# Patient Record
Sex: Female | Born: 1985 | Race: White | Hispanic: No | Marital: Married | Smoking: Current every day smoker
Health system: Southern US, Community
[De-identification: ages and names within clinical notes are randomized; demographics above are authoritative.]

## PROBLEM LIST (undated history)

## (undated) DIAGNOSIS — M1990 Unspecified osteoarthritis, unspecified site: Secondary | ICD-10-CM

## (undated) DIAGNOSIS — R519 Headache, unspecified: Secondary | ICD-10-CM

## (undated) DIAGNOSIS — F32A Depression, unspecified: Secondary | ICD-10-CM

## (undated) DIAGNOSIS — F3130 Bipolar disorder, current episode depressed, mild or moderate severity, unspecified: Secondary | ICD-10-CM

## (undated) DIAGNOSIS — F909 Attention-deficit hyperactivity disorder, unspecified type: Secondary | ICD-10-CM

## (undated) DIAGNOSIS — F419 Anxiety disorder, unspecified: Secondary | ICD-10-CM

## (undated) DIAGNOSIS — R51 Headache: Secondary | ICD-10-CM

## (undated) DIAGNOSIS — F329 Major depressive disorder, single episode, unspecified: Secondary | ICD-10-CM

## (undated) DIAGNOSIS — N189 Chronic kidney disease, unspecified: Secondary | ICD-10-CM

## (undated) DIAGNOSIS — F319 Bipolar disorder, unspecified: Secondary | ICD-10-CM

## (undated) HISTORY — DX: Major depressive disorder, single episode, unspecified: F32.9

## (undated) HISTORY — PX: SHOULDER SURGERY: SHX246

## (undated) HISTORY — PX: KIDNEY SURGERY: SHX687

## (undated) HISTORY — DX: Depression, unspecified: F32.A

## (undated) HISTORY — DX: Bipolar disorder, unspecified: F31.9

## (undated) HISTORY — DX: Attention-deficit hyperactivity disorder, unspecified type: F90.9

## (undated) HISTORY — PX: FOOT SURGERY: SHX648

## (undated) HISTORY — DX: Anxiety disorder, unspecified: F41.9

## (undated) HISTORY — PX: TUBAL LIGATION: SHX77

---

## 2008-10-11 HISTORY — PX: NOVASURE ABLATION: SHX5394

## 2014-01-21 DIAGNOSIS — N200 Calculus of kidney: Secondary | ICD-10-CM | POA: Insufficient documentation

## 2014-01-21 DIAGNOSIS — F4310 Post-traumatic stress disorder, unspecified: Secondary | ICD-10-CM | POA: Diagnosis present

## 2014-01-21 DIAGNOSIS — F609 Personality disorder, unspecified: Secondary | ICD-10-CM | POA: Insufficient documentation

## 2014-01-21 DIAGNOSIS — F1320 Sedative, hypnotic or anxiolytic dependence, uncomplicated: Secondary | ICD-10-CM | POA: Diagnosis present

## 2015-09-19 ENCOUNTER — Ambulatory Visit
Admission: RE | Admit: 2015-09-19 | Discharge: 2015-09-19 | Disposition: A | Discharge status: Home / Self Care | Payer: Medicaid Other | Source: Ambulatory Visit | Attending: Psychiatry | Admitting: Psychiatry

## 2015-09-19 ENCOUNTER — Ambulatory Visit (INDEPENDENT_AMBULATORY_CARE_PROVIDER_SITE_OTHER): Payer: Medicaid Other | Admitting: Psychiatry

## 2015-09-19 ENCOUNTER — Encounter: Payer: Self-pay | Admitting: Psychiatry

## 2015-09-19 ENCOUNTER — Encounter
Admission: RE | Admit: 2015-09-19 | Discharge: 2015-09-19 | Disposition: A | Discharge status: Home / Self Care | Payer: Medicaid Other | Source: Ambulatory Visit | Attending: Psychiatry | Admitting: Psychiatry

## 2015-09-19 VITALS — BP 120/84 | HR 87 | Temp 98.5°F | Ht 64.0 in | Wt 253.2 lb

## 2015-09-19 DIAGNOSIS — Z720 Tobacco use: Secondary | ICD-10-CM | POA: Insufficient documentation

## 2015-09-19 DIAGNOSIS — F332 Major depressive disorder, recurrent severe without psychotic features: Secondary | ICD-10-CM

## 2015-09-19 DIAGNOSIS — F17200 Nicotine dependence, unspecified, uncomplicated: Secondary | ICD-10-CM

## 2015-09-19 DIAGNOSIS — F603 Borderline personality disorder: Secondary | ICD-10-CM

## 2015-09-19 DIAGNOSIS — F3181 Bipolar II disorder: Secondary | ICD-10-CM | POA: Diagnosis not present

## 2015-09-19 HISTORY — DX: Headache, unspecified: R51.9

## 2015-09-19 HISTORY — DX: Chronic kidney disease, unspecified: N18.9

## 2015-09-19 HISTORY — DX: Headache: R51

## 2015-09-19 HISTORY — DX: Unspecified osteoarthritis, unspecified site: M19.90

## 2015-09-19 LAB — BASIC METABOLIC PANEL
Anion gap: 8 (ref 5–15)
BUN: 11 mg/dL (ref 6–20)
CALCIUM: 9.9 mg/dL (ref 8.9–10.3)
CO2: 26 mmol/L (ref 22–32)
Chloride: 103 mmol/L (ref 101–111)
Creatinine, Ser: 0.88 mg/dL (ref 0.44–1.00)
GFR calc Af Amer: >60 mL/min (ref >60)
Glucose, Bld: 101 mg/dL — ABNORMAL HIGH (ref 65–99)
POTASSIUM: 4.1 mmol/L (ref 3.5–5.1)
SODIUM: 137 mmol/L (ref 135–145)

## 2015-09-19 LAB — URINALYSIS COMPLETE WITH MICROSCOPIC (ARMC ONLY)
BILIRUBIN URINE: NEGATIVE
Glucose, UA: NEGATIVE mg/dL
Hgb urine dipstick: NEGATIVE
Ketones, ur: NEGATIVE mg/dL
LEUKOCYTES UA: NEGATIVE
Nitrite: NEGATIVE
PH: 5 (ref 5.0–8.0)
PROTEIN: NEGATIVE mg/dL
Specific Gravity, Urine: 1.015 (ref 1.005–1.030)

## 2015-09-19 LAB — CBC
HCT: 42.5 % (ref 35.0–47.0)
Hemoglobin: 13.9 g/dL (ref 12.0–16.0)
MCH: 29.8 pg (ref 26.0–34.0)
MCHC: 32.7 g/dL (ref 32.0–36.0)
MCV: 91.1 fL (ref 80.0–100.0)
PLATELETS: 197 10*3/uL (ref 150–440)
RBC: 4.66 MIL/uL (ref 3.80–5.20)
RDW: 13 % (ref 11.5–14.5)
WBC: 10.1 10*3/uL (ref 3.6–11.0)

## 2015-09-19 NOTE — Progress Notes (Signed)
[facility] MD Progress Note  09/19/2015 1:53 PM Brittany [name]  MRN:  [id number] Subjective:  Consult for this 29 year old woman with a long-standing history of psychiatric illness for evaluation of possible ECT. [facility]. Notes reviewed. Patient states that she has had mood disorder symptoms at least since age 9 probably before that. Current symptoms consist of constantly depressed mood, feeling of hopelessness, alternating poor sleep and excessive sleepiness, "racing thoughts", frequent anxiety, and frequent suicidal thoughts area patient does not describe hallucinations or delusions. She is currently seeing a physician's assistant and therapist for psychiatric treatment. Current medicines are lithium 600 mg at night, alprazolam 2 mg 3 times a day, Adderall 30 mg per day, trazodone 300 mg per night. Patient is requesting evaluation for ECT to try to address her symptoms of depression area  Social history: Patient lives with her husband and children. Patient does not work outside the home. She has a tumultuous emotional relationship with her family of origin but appears to feel that her husband is supportive.  Medical history: Patient denies any history of heart disease diabetes heart attack or stroke. She has had anesthesia in the past with some nausea as a result. Patient is overweight. No complaints of any breathing disorders.  Substance abuse history: Patient admits that she uses marijuana on a frequent basis. Alcohol use is occasional and not abusive. Denies other drug abuse. Principal Problem: @ Diagnosis:  There are no active problems to display for this patient.  Total Time spent with patient: 1 hour  Past Psychiatric History: Patient states she has had psychiatric treatment since her adolescence. She has had 7 or 8 psychiatric hospitalizations. She has had multiple suicide attempts by overdose primarily. Patient last tried to harm herself about a  month and a half ago when she impulsively decided to drink a large amount of salt water at once. She reports having been on multiple medications including a list of antidepressants that includes Prozac, Paxil, Effexor, Cymbalta, Lexapro, lithium, Abilify, Xanax. She is dependent on chronic Xanax but does not feel any medicine has ever helped her mood to feel better. Never had ECT before. She does not describe what sounds a Full-blown type I manic attack but describes chronic mood instability.  Past Medical History:  Past Medical History  Diagnosis Date  . ADHD (attention deficit hyperactivity disorder)   . Bipolar disorder (HCC)   . Anxiety   . Depression     Past Surgical History  Procedure Laterality Date  . Foot surgery    . Tubal ligation    . Cesarean section    . Shoulder surgery Right   . Kidney surgery     Family History:  Family History  Problem Relation Age of Onset  . Anxiety disorder Mother   . Depression Mother   . Drug abuse Mother   . Bipolar disorder Mother   . Depression Father   . Anxiety disorder Father   . Drug abuse Father   . Anxiety disorder Sister   . Depression Sister   . ADD / ADHD Sister   . Anxiety disorder Sister   . Depression Sister    Family Psychiatric  History: Patient describes extensive family history of mental illness including a great grandfather who committed suicide, grandmother who committed suicide and several other relatives a generation or 2 away who have made suicide attempts Social History:  History  Alcohol Use  . 2.4 - 7.2 oz/week  . 0 Standard drinks  or equivalent, 4 Glasses of wine, 0-6 Cans of beer, 0-2 Shots of liquor per week     History  Drug Use  . Yes  . Special: Marijuana    Comment: last used about 3 weeks ago    Social History   Social History  . Marital Status: Married    Spouse Name: N/A  . Number of Children: N/A  . Years of Education: N/A   Social History Main Topics  . Smoking status: Current Some  Day Smoker    Types: Cigarettes  . Smokeless tobacco: Never Used  . Alcohol Use: 2.4 - 7.2 oz/week    0 Standard drinks or equivalent, 4 Glasses of wine, 0-6 Cans of beer, 0-2 Shots of liquor per week  . Drug Use: Yes    Special: Marijuana     Comment: last used about 3 weeks ago  . Sexual Activity: Yes    Birth Control/ Protection: None   Other Topics Concern  . None   Social History Narrative  . None   Additional Social History:                         Sleep: Fair  Appetite:  Fair  Current Medications: Current Outpatient Prescriptions  Medication Sig Dispense Refill  . alprazolam (XANAX) 2 MG tablet Take 2 mg by mouth at bedtime as needed for sleep.    Marland Kitchen. amphetamine-dextroamphetamine (ADDERALL) 30 MG tablet Take 30 mg by mouth daily.    Marland Kitchen. lithium 300 MG tablet Take 300 mg by mouth 3 (three) times daily.    . trazodone (DESYREL) 300 MG tablet Take 300 mg by mouth at bedtime.     No current facility-administered medications for this visit.    Lab Results: No results found for this or any previous visit (from the past 48 hour(s)).  Physical Findings: AIMS:  , ,  ,  ,    CIWA:    COWS:     Musculoskeletal: Strength & Muscle Tone: within normal limits Gait & Station: normal Patient leans: N/A  Psychiatric Specialty Exam: ROS  Blood pressure 120/84, pulse 87, temperature 98.5 F (36.9 C), temperature source Tympanic, height 5\' 4"  (1.626 m), weight 253 lb 3.2 oz (114.851 kg), SpO2 98 %.Body mass index is 43.44 kg/(m^2).  General Appearance: Casual  Eye Contact::  Fair  Speech:  Clear and Coherent  Volume:  Normal  Mood:  Depressed  Affect:  Tearful  Thought Process:  Linear  Orientation:  Full (Time, Place, and Person)  Thought Content:  Negative  Suicidal Thoughts:  Yes.  without intent/plan  Homicidal Thoughts:  No  Memory:  Immediate;   Good Recent;   Good Remote;   Good  Judgement:  Fair  Insight:  Fair  Psychomotor Activity:  Normal   Concentration:  Good  Recall:  Good  Fund of Knowledge:Good  Language: Good  Akathisia:  No  Handed:  Right  AIMS (if indicated):     Assets:  Communication Skills Desire for Improvement Financial Resources/Insurance Housing Physical Health Resilience Social Support  ADL's:  Intact  Cognition: WNL  Sleep:      Treatment Plan Summary: Plan patient has a diagnosis of bipolar disorder. My diagnosis would be chronic severe major depression and possible bipolar disorder type II. She also has a diagnosis of borderline personality disorder. Possible PTSD. Patient continues to have chronic severe mood complaints which are very from day to day but are chronically impairing and  have not responded to treatment so far. Based on this history of mood symptoms she would be a potential candidate for ECT. Potentially contraindicating factors are the presence of severe anxiety, presence of borderline personality disorder, presence of current high-dose benzodiazepine treatment. Patient was informed of this and informed that while ECT may have some potential to benefit her I cannot give her a distinct percentage and I think her chance of benefit is lower than with a patient with a more optimal situation. Nevertheless I'm willing to offer a trial of ECT treatment. Side effects including short-term memory loss, rare longer term memory loss, headache, nausea, rare delirium all described. Risk of cardiac event described. Patient still requests treatment. I informed her that she needs to be off of Xanax or is close to off of it as possible. I would like her to cut her Xanax dose down to 2 mg twice a day for a week then cut that in half for another week and then trying to cut that in half again. She has been given an order form to get the standard lab tests of a blood count, chemistry panel, chest x-ray, urinalysis, urine pregnancy screen and EKG done. These have been faxed to the lab. Her information will be forwarded  to the utilization review nurse. Our earliest available time to begin ECT would be 10/15/2015. We will put that down as a tentative start date for now. Patient will stay in contact with [place]. If anything gets in our way before then we will let her know. She agrees to the plan.  John Clapacs 09/19/2015, 1:53 PM

## 2015-09-19 NOTE — Patient Instructions (Addendum)
  Your procedure is scheduled on: October 15, 2015 (Wednesday) Report to Day Surgery.[facility](Medical Mall) Second Floor To find out your arrival time please call [phone] between 1PM - 3PM on October 14, 2015 (Tuesday).  Remember: Instructions that are not followed completely may result in serious medical risk, up to and including death, or upon the discretion of your surgeon and anesthesiologist your surgery may need to be rescheduled.    ___x_ 1. Do not eat food or drink liquids after midnight. No gum chewing or hard candies.     ___x_ 2. No Alcohol for 24 hours before or after surgery.   ____ 3. Bring all medications with you on the day of surgery if instructed.    __x 4. Notify your doctor if there is any change in your medical condition     (cold, fever, infections).     Do not wear jewelry, make-up, hairpins, clips or nail polish.  Do not wear lotions, powders, or perfumes. You may wear deodorant.  Do not shave 48 hours prior to surgery. Men may shave face and neck.  Do not bring valuables to the hospital.    [facility] any belongings or valuables.               Contacts, dentures or bridgework may not be worn into surgery.  Leave your suitcase in the car. After surgery it may be brought to your room.  For patients admitted to the hospital, discharge time is determined by your                treatment team.   Patients discharged the day of surgery will not be allowed to drive home.   Please read over the following fact sheets that you were given:   Surgical Site Infection Prevention   ____ Take these medicines the morning of surgery with A SIP OF WATER:    1.  .    ____ Fleet Enema (as directed)   ____ Use CHG Soap as directed  ____ Use inhalers on the day of surgery  ____ Stop metformin 2 days prior to surgery    ____ Take 1/2 of usual insulin dose the night before surgery and none on the morning of surgery.   ____ Stop Coumadin/Plavix/aspirin  on   ____ Stop Anti-inflammatories on    ____ Stop supplements until after surgery.    ____ Bring C-Pap to the hospital.

## 2015-09-22 ENCOUNTER — Telehealth (HOSPITAL_COMMUNITY): Payer: Self-pay | Admitting: *Deleted

## 2015-10-10 ENCOUNTER — Telehealth: Payer: Self-pay | Admitting: *Deleted

## 2015-10-12 HISTORY — PX: CHOLECYSTECTOMY: SHX55

## 2015-10-17 ENCOUNTER — Encounter: Payer: Self-pay | Admitting: Anesthesiology

## 2015-10-17 ENCOUNTER — Encounter
Admission: RE | Admit: 2015-10-17 | Discharge: 2015-10-17 | Disposition: A | Discharge status: Home / Self Care | Payer: Medicaid Other | Source: Ambulatory Visit | Attending: Psychiatry | Admitting: Psychiatry

## 2015-10-17 DIAGNOSIS — F909 Attention-deficit hyperactivity disorder, unspecified type: Secondary | ICD-10-CM | POA: Insufficient documentation

## 2015-10-17 DIAGNOSIS — N189 Chronic kidney disease, unspecified: Secondary | ICD-10-CM | POA: Insufficient documentation

## 2015-10-17 DIAGNOSIS — Z87891 Personal history of nicotine dependence: Secondary | ICD-10-CM | POA: Insufficient documentation

## 2015-10-17 DIAGNOSIS — Z881 Allergy status to other antibiotic agents status: Secondary | ICD-10-CM | POA: Diagnosis not present

## 2015-10-17 DIAGNOSIS — F333 Major depressive disorder, recurrent, severe with psychotic symptoms: Secondary | ICD-10-CM | POA: Diagnosis present

## 2015-10-17 DIAGNOSIS — F419 Anxiety disorder, unspecified: Secondary | ICD-10-CM | POA: Diagnosis not present

## 2015-10-17 DIAGNOSIS — M1990 Unspecified osteoarthritis, unspecified site: Secondary | ICD-10-CM | POA: Insufficient documentation

## 2015-10-17 DIAGNOSIS — F332 Major depressive disorder, recurrent severe without psychotic features: Secondary | ICD-10-CM

## 2015-10-17 DIAGNOSIS — Z87442 Personal history of urinary calculi: Secondary | ICD-10-CM | POA: Diagnosis not present

## 2015-10-17 DIAGNOSIS — F3181 Bipolar II disorder: Secondary | ICD-10-CM | POA: Insufficient documentation

## 2015-10-17 MED ORDER — METHOHEXITAL SODIUM 100 MG/10ML IV SOSY
100.0000 mg | PREFILLED_SYRINGE | Freq: Once | INTRAVENOUS | Status: AC
  Administered 2015-10-17: 100 mg via INTRAVENOUS

## 2015-10-17 MED ORDER — SUCCINYLCHOLINE CHLORIDE 20 MG/ML IJ SOLN
120.0000 mg | Freq: Once | INTRAMUSCULAR | Status: AC
  Administered 2015-10-17: 120 mg via INTRAVENOUS

## 2015-10-17 MED ORDER — HALOPERIDOL LACTATE 5 MG/ML IJ SOLN
2.5000 mg | INTRAMUSCULAR | Status: AC
  Administered 2015-10-17: 2.5 mg via INTRAVENOUS

## 2015-10-17 MED ORDER — ONDANSETRON HCL 4 MG/2ML IJ SOLN
4.0000 mg | Freq: Once | INTRAMUSCULAR | Status: AC
  Administered 2015-10-17: 4 mg via INTRAVENOUS

## 2015-10-17 MED ORDER — DEXTROSE 5 % IV SOLN
250.0000 mL | Freq: Once | INTRAVENOUS | Status: AC
  Administered 2015-10-17: 500 mL via INTRAVENOUS

## 2015-10-17 MED ORDER — HALOPERIDOL LACTATE 5 MG/ML IJ SOLN
2.5000 mg | Freq: Four times a day (QID) | INTRAMUSCULAR | Status: DC | PRN
  Administered 2015-10-17: 2.5 mg via INTRAVENOUS

## 2015-10-17 MED ORDER — MIDAZOLAM HCL 2 MG/2ML IJ SOLN
2.0000 mg | INTRAMUSCULAR | Status: AC
  Administered 2015-10-17: 2 mg via INTRAVENOUS

## 2015-10-17 MED ORDER — MIDAZOLAM HCL 5 MG/5ML IJ SOLN
2.0000 mg | Freq: Once | INTRAMUSCULAR | Status: AC
  Administered 2015-10-17: 2 mg via INTRAVENOUS

## 2015-10-17 MED ORDER — LIDOCAINE HCL (CARDIAC) 20 MG/ML IV SOLN
4.0000 mg | Freq: Once | INTRAVENOUS | Status: AC
  Administered 2015-10-17: 4 mg via INTRAVENOUS

## 2015-10-17 MED ORDER — ONDANSETRON HCL 4 MG/2ML IJ SOLN: 4.0000 mg | Freq: Once | INTRAMUSCULAR | Status: DC

## 2015-10-17 NOTE — Progress Notes (Signed)
Pt doing much better.

## 2015-10-17 NOTE — Progress Notes (Signed)
Pt very agitated sitting up in bed

## 2015-10-17 NOTE — H&P (Signed)
Brittany [name] is an 30 y.o. female.   Chief Complaint: Been nonresponsive to multiple medicines who is starting ECT treatment today. Continues to have depression no other new complaints no physical complaints currently HPI: Patient has come down off of her Xanax. Still taking the lithium. Not acutely suicidal  Past Medical History  Diagnosis Date  . ADHD (attention deficit hyperactivity disorder)   . Bipolar disorder (HCC)   . Anxiety   . Depression   . Chronic kidney disease     Kidney stones  . Headache   . Arthritis     Past Surgical History  Procedure Laterality Date  . Foot surgery    . Tubal ligation    . Cesarean section    . Shoulder surgery Right   . Kidney surgery    . Novasure ablation  2010    Family History  Problem Relation Age of Onset  . Anxiety disorder Mother   . Depression Mother   . Drug abuse Mother   . Bipolar disorder Mother   . Depression Father   . Anxiety disorder Father   . Drug abuse Father   . Anxiety disorder Sister   . Depression Sister   . ADD / ADHD Sister   . Anxiety disorder Sister   . Depression Sister    Social History:  reports that she has quit smoking. Her smoking use included Cigarettes. She has never used smokeless tobacco. She reports that she drinks about 2.4 - 7.2 oz of alcohol per week. She reports that she uses illicit drugs (Marijuana).  Allergies:  Allergies  Allergen Reactions  . Ceclor [Cefaclor] Hives    "vomiting"     (Not in a hospital admission)  No results found for this or any previous visit (from the past 48 hour(s)). No results found.  Review of Systems  Constitutional: Negative.   HENT: Negative.   Eyes: Negative.   Respiratory: Negative.   Cardiovascular: Negative.   Gastrointestinal: Negative.   Musculoskeletal: Negative.   Skin: Negative.   Neurological: Negative.   Psychiatric/Behavioral: Positive for depression. Negative for suicidal ideas, hallucinations, memory loss and substance  abuse. The patient is nervous/anxious. The patient does not have insomnia.     Blood pressure 133/89, pulse 84, temperature 97.1 F (36.2 C), temperature source Oral, resp. rate 18, height 5\' 4"  (1.626 m), weight 112.492 kg (248 lb), SpO2 99 %. Physical Exam  Nursing note and vitals reviewed. Constitutional: She appears well-developed and well-nourished.  HENT:  Head: Normocephalic and atraumatic.  Eyes: Conjunctivae are normal. Pupils are equal, round, and reactive to light.  Neck: Normal range of motion.  Cardiovascular: Normal rate, regular rhythm and normal heart sounds.   Respiratory: Effort normal and breath sounds normal. No respiratory distress.  GI: Soft.  Musculoskeletal: Normal range of motion.  Neurological: She is alert.  Skin: Skin is warm and dry.  Psychiatric: Her speech is normal and behavior is normal. Judgment and thought content normal. Cognition and memory are normal. She exhibits a depressed mood.     Assessment/Plan Follow-up treatments after today will be scheduled on the usual 3 times a week basis.  Jeane Cashatt 10/17/2015, 1:46 PM

## 2015-10-17 NOTE — Transfer of Care (Signed)
Immediate Anesthesia Transfer of Care Note  Patient: Brittany [name]  Procedure(s) Performed: ECT  Patient Location: PACU  Anesthesia Type:General  Level of Consciousness: sedated  Airway & Oxygen Therapy: Patient Spontanous Breathing and Patient connected to face mask oxygen  Post-op Assessment: Report given to RN and Post -op Vital signs reviewed and stable  Post vital signs: Reviewed and stable  Last Vitals:  Filed Vitals:   10/17/15 1002 10/17/15 1418  BP: 133/89 141/123  Pulse: 84 112  Temp: 36.2 C 36.7 C  Resp: 18 16    Complications: No apparent anesthesia complications

## 2015-10-17 NOTE — Procedures (Signed)
ECT SERVICES Physician's Interval Evaluation & Treatment Note  Patient Identification: [name] MRN:  [id number] Date of Evaluation:  10/17/2015 TX #: 1  MADRS: 31  MMSE:  30 P.E. Findings:  Lungs clear. Heart regular rate and rhythm. No swelling. Physical exam unremarkable. Vitals normal.  Psychiatric Interval Note:  Mood subjectively depressed. Current depression score 31. No acute suicidal intent or plan  Subjective:  Patient is a 30 y.o. female seen for evaluation for Electroconvulsive Therapy. Mainly complaining of depression and agreeable to ECT  Treatment Summary:   [x]   Right Unilateral             []  Bilateral   % Energy : 0.3 ms 30%   Impedance:  1140 ohms  Seizure Energy Index:  8605 v squared  Postictal Suppression Index:  94%  Seizure Concordance Index: 9%  Medications  Pre Shock:  Xylcaine 4 mg Brevital 100 mg, succinylcholine 10 mg  Post Shock:   Versed 2 mg, haloperidol 2.5 m  Seizure Duration: 7 9seconds by EMG, 90 seconds by EEG    Comments:  patienthad agitationafter procedur. Will reure sedation aftr future treatments. Otherwise seizure rameers good. No change to that We wil ollow-up wedesday beauseof schdled heher impirments   Lungs:  [x]   Clear to auscultation               []  Other:   Heart:    [x]   Regular rhythm             []  irregular rhythm    [x]   Previous H&P reviewed, patient examined and there are NO CHANGES                 []   Previous H&P reviewed, patient examined and there are changes noted.   Mordecai RasmussenJohn Angelisa Winthrop, MD 1/6/20171:48 PM

## 2015-10-17 NOTE — Progress Notes (Signed)
Dr clapacs in to see pt   Haldol 2.5  zofran given for nausea and versed given

## 2015-10-17 NOTE — Anesthesia Preprocedure Evaluation (Signed)
Anesthesia Evaluation  Patient identified by MRN, date of birth, ID band Patient awake    Reviewed: Allergy & Precautions, NPO status , Patient's Chart, lab work & pertinent test results  Airway Mallampati: II       Dental  (+) Teeth Intact   Pulmonary neg pulmonary ROS, former smoker,    breath sounds clear to auscultation       Cardiovascular negative cardio ROS   Rhythm:Regular     Neuro/Psych Bipolar Disorder    GI/Hepatic negative GI ROS, Neg liver ROS,   Endo/Other  Morbid obesity  Renal/GU negative Renal ROS     Musculoskeletal negative musculoskeletal ROS (+)   Abdominal (+) + obese,   Peds negative pediatric ROS (+)  Hematology negative hematology ROS (+)   Anesthesia Other Findings   Reproductive/Obstetrics                             Anesthesia Physical Anesthesia Plan  ASA: II  Anesthesia Plan: General   Post-op Pain Management:    Induction: Intravenous  Airway Management Planned: Simple Face Mask  Additional Equipment:   Intra-op Plan:   Post-operative Plan:   Informed Consent: I have reviewed the patients History and Physical, chart, labs and discussed the procedure including the risks, benefits and alternatives for the proposed anesthesia with the patient or authorized representative who has indicated his/her understanding and acceptance.     Plan Discussed with: CRNA  Anesthesia Plan Comments:         Anesthesia Quick Evaluation

## 2015-10-20 NOTE — Anesthesia Postprocedure Evaluation (Signed)
Anesthesia Post Note  Patient: Brittany [name]  Procedure(s) Performed: * No procedures listed *  Patient location during evaluation: PACU Anesthesia Type: General Level of consciousness: awake Pain management: satisfactory to patient Vital Signs Assessment: post-procedure vital signs reviewed and stable Respiratory status: respiratory function stable Cardiovascular status: stable Anesthetic complications: no    Last Vitals:  Filed Vitals:   10/17/15 1445 10/17/15 1503  BP: 116/59 97/69  Pulse: 80 94  Temp: 36.5 C   Resp:  18    Last Pain:  Filed Vitals:   10/17/15 1506  PainSc: 4                  VAN STAVEREN,Carmellia Kreisler

## 2015-10-22 ENCOUNTER — Encounter
Admission: RE | Admit: 2015-10-22 | Discharge: 2015-10-22 | Disposition: A | Discharge status: Home / Self Care | Payer: Medicaid Other | Source: Ambulatory Visit | Attending: Psychiatry | Admitting: Psychiatry

## 2015-10-22 ENCOUNTER — Encounter: Payer: Self-pay | Admitting: Anesthesiology

## 2015-10-22 DIAGNOSIS — F3130 Bipolar disorder, current episode depressed, mild or moderate severity, unspecified: Secondary | ICD-10-CM | POA: Diagnosis not present

## 2015-10-22 DIAGNOSIS — Z87891 Personal history of nicotine dependence: Secondary | ICD-10-CM | POA: Insufficient documentation

## 2015-10-22 DIAGNOSIS — F419 Anxiety disorder, unspecified: Secondary | ICD-10-CM | POA: Insufficient documentation

## 2015-10-22 DIAGNOSIS — N189 Chronic kidney disease, unspecified: Secondary | ICD-10-CM | POA: Diagnosis not present

## 2015-10-22 DIAGNOSIS — Z87442 Personal history of urinary calculi: Secondary | ICD-10-CM | POA: Diagnosis not present

## 2015-10-22 DIAGNOSIS — F319 Bipolar disorder, unspecified: Secondary | ICD-10-CM | POA: Insufficient documentation

## 2015-10-22 DIAGNOSIS — Z881 Allergy status to other antibiotic agents status: Secondary | ICD-10-CM | POA: Insufficient documentation

## 2015-10-22 DIAGNOSIS — M1990 Unspecified osteoarthritis, unspecified site: Secondary | ICD-10-CM | POA: Diagnosis not present

## 2015-10-22 DIAGNOSIS — F909 Attention-deficit hyperactivity disorder, unspecified type: Secondary | ICD-10-CM | POA: Diagnosis not present

## 2015-10-22 MED ORDER — SUCCINYLCHOLINE CHLORIDE 20 MG/ML IJ SOLN: 110.0000 mg | Freq: Once | INTRAMUSCULAR | Status: DC

## 2015-10-22 MED ORDER — METHOHEXITAL SODIUM 100 MG/10ML IV SOSY
100.0000 mg | PREFILLED_SYRINGE | Freq: Once | INTRAVENOUS | Status: AC
  Administered 2015-10-22: 100 mg via INTRAVENOUS

## 2015-10-22 MED ORDER — LIDOCAINE HCL (CARDIAC) 20 MG/ML IV SOLN
4.0000 mg | Freq: Once | INTRAVENOUS | Status: AC
  Administered 2015-10-22: 4 mg via INTRAVENOUS

## 2015-10-22 MED ORDER — ONDANSETRON HCL 4 MG/2ML IJ SOLN: 4.0000 mg | Freq: Once | INTRAMUSCULAR | Status: DC | PRN

## 2015-10-22 MED ORDER — ONDANSETRON HCL 4 MG/2ML IJ SOLN
4.0000 mg | Freq: Once | INTRAMUSCULAR | Status: AC
  Administered 2015-10-22: 4 mg via INTRAVENOUS

## 2015-10-22 MED ORDER — SUCCINYLCHOLINE CHLORIDE 20 MG/ML IJ SOLN
120.0000 mg | Freq: Once | INTRAMUSCULAR | Status: AC
  Administered 2015-10-22: 120 mg via INTRAVENOUS

## 2015-10-22 MED ORDER — HALOPERIDOL LACTATE 5 MG/ML IJ SOLN
5.0000 mg | Freq: Once | INTRAMUSCULAR | Status: AC
  Administered 2015-10-22: 5 mg via INTRAVENOUS
  Filled 2015-10-22: qty 1

## 2015-10-22 MED ORDER — DEXTROSE 5 % IV SOLN
250.0000 mL | Freq: Once | INTRAVENOUS | Status: AC
  Administered 2015-10-22: 500 mL via INTRAVENOUS

## 2015-10-22 MED ORDER — KETOROLAC TROMETHAMINE 30 MG/ML IJ SOLN
30.0000 mg | Freq: Once | INTRAMUSCULAR | Status: AC
  Administered 2015-10-22: 30 mg via INTRAVENOUS

## 2015-10-22 MED ORDER — MIDAZOLAM HCL 2 MG/2ML IJ SOLN
2.0000 mg | Freq: Once | INTRAMUSCULAR | Status: AC
  Administered 2015-10-22: 2 mg via INTRAVENOUS

## 2015-10-22 MED ORDER — FENTANYL CITRATE (PF) 100 MCG/2ML IJ SOLN: 25.0000 ug | INTRAMUSCULAR | Status: DC | PRN

## 2015-10-22 NOTE — Anesthesia Postprocedure Evaluation (Signed)
Anesthesia Post Note  Patient: Brittany [name]  Procedure(s) Performed: * No procedures listed *  Patient location during evaluation: PACU Anesthesia Type: General Level of consciousness: awake and alert and oriented Pain management: pain level controlled Vital Signs Assessment: post-procedure vital signs reviewed and stable Respiratory status: spontaneous breathing Cardiovascular status: blood pressure returned to baseline Anesthetic complications: no    Last Vitals:  Filed Vitals:   10/22/15 1128 10/22/15 1146  BP:  118/84  Pulse: 102 99  Temp: 37.2 C   Resp: 23 20    Last Pain:  Filed Vitals:   10/22/15 1151  PainSc: 0-No pain                 [name]

## 2015-10-22 NOTE — H&P (Signed)
Brittany [name] is an 30 y.o. female.   Chief Complaint: Patient reports that mood is perhaps better. Not having active suicidal ideation. She had some soreness after the last treatment. No other clear new complaint HPI: Patient with a history of depression being treated for depression with ECT.  Past Medical History  Diagnosis Date  . ADHD (attention deficit hyperactivity disorder)   . Bipolar disorder (HCC)   . Anxiety   . Depression   . Chronic kidney disease     Kidney stones  . Headache   . Arthritis     Past Surgical History  Procedure Laterality Date  . Foot surgery    . Tubal ligation    . Cesarean section    . Shoulder surgery Right   . Kidney surgery    . Novasure ablation  2010    Family History  Problem Relation Age of Onset  . Anxiety disorder Mother   . Depression Mother   . Drug abuse Mother   . Bipolar disorder Mother   . Depression Father   . Anxiety disorder Father   . Drug abuse Father   . Anxiety disorder Sister   . Depression Sister   . ADD / ADHD Sister   . Anxiety disorder Sister   . Depression Sister    Social History:  reports that she has quit smoking. Her smoking use included Cigarettes. She has never used smokeless tobacco. She reports that she drinks about 2.4 - 7.2 oz of alcohol per week. She reports that she uses illicit drugs (Marijuana).  Allergies:  Allergies  Allergen Reactions  . Ceclor [Cefaclor] Hives    "vomiting"     (Not in a hospital admission)  No results found for this or any previous visit (from the past 48 hour(s)). No results found.  Review of Systems  Constitutional: Negative.   HENT: Negative.   Eyes: Negative.   Respiratory: Negative.   Cardiovascular: Negative.   Gastrointestinal: Negative.   Musculoskeletal: Negative.   Skin: Negative.   Neurological: Negative.   Psychiatric/Behavioral: Negative for depression, suicidal ideas, memory loss and substance abuse. The patient is not nervous/anxious and  does not have insomnia.     Blood pressure 130/85, pulse 85, temperature 98.1 F (36.7 C), temperature source Oral, height 5\' 4"  (1.626 m), weight 112.038 kg (247 lb). Physical Exam  Nursing note and vitals reviewed. Constitutional: She appears well-developed and well-nourished.  HENT:  Head: Normocephalic and atraumatic.  Eyes: Conjunctivae are normal. Pupils are equal, round, and reactive to light.  Neck: Normal range of motion.  Cardiovascular: Normal rate, regular rhythm and normal heart sounds.   Respiratory: Effort normal and breath sounds normal.  GI: Soft.  Musculoskeletal: Normal range of motion.  Neurological: She is alert.  Skin: Skin is warm and dry.  Psychiatric: She has a normal mood and affect. Her behavior is normal. Judgment and thought content normal.     Assessment/Plan Treatment today with plan for follow-up on Friday and continued assessment  John Clapacs 10/22/2015, 10:33 AM

## 2015-10-22 NOTE — Anesthesia Preprocedure Evaluation (Signed)
Anesthesia Evaluation  Patient identified by MRN, date of birth, ID band Patient awake    Reviewed: Allergy & Precautions, NPO status , Patient's Chart, lab work & pertinent test results  Airway Mallampati: II       Dental  (+) Teeth Intact   Pulmonary neg pulmonary ROS, former smoker,    breath sounds clear to auscultation       Cardiovascular negative cardio ROS   Rhythm:Regular     Neuro/Psych  Headaches, PSYCHIATRIC DISORDERS Anxiety Depression Bipolar Disorder    GI/Hepatic negative GI ROS, Neg liver ROS,   Endo/Other  Morbid obesity  Renal/GU Renal InsufficiencyRenal diseasenegative Renal ROS  negative genitourinary   Musculoskeletal negative musculoskeletal ROS (+) Arthritis , Osteoarthritis,    Abdominal (+) + obese,   Peds negative pediatric ROS (+)  Hematology negative hematology ROS (+)   Anesthesia Other Findings   Reproductive/Obstetrics                             Anesthesia Physical  Anesthesia Plan  ASA: II  Anesthesia Plan: General   Post-op Pain Management:    Induction: Intravenous  Airway Management Planned: Simple Face Mask  Additional Equipment:   Intra-op Plan:   Post-operative Plan:   Informed Consent: I have reviewed the patients History and Physical, chart, labs and discussed the procedure including the risks, benefits and alternatives for the proposed anesthesia with the patient or authorized representative who has indicated his/her understanding and acceptance.     Plan Discussed with: CRNA  Anesthesia Plan Comments:         Anesthesia Quick Evaluation

## 2015-10-22 NOTE — Transfer of Care (Signed)
Immediate Anesthesia Transfer of Care Note  Patient: Brittany [name]  Procedure(s) Performed: * No procedures listed *  Patient Location: PACU  Anesthesia Type:General  Level of Consciousness: awake, alert , oriented and patient cooperative  Airway & Oxygen Therapy: Patient Spontanous Breathing and Patient connected to face mask oxygen  Post-op Assessment: Report given to RN, Post -op Vital signs reviewed and stable and Patient moving all extremities X 4  Post vital signs: Reviewed and stable  Last Vitals:  Filed Vitals:   10/22/15 0854  BP: 130/85  Pulse: 85  Temp: 36.7 C    Complications: No apparent anesthesia complications

## 2015-10-22 NOTE — Procedures (Signed)
ECT SERVICES Physician's Interval Evaluation & Treatment Note  Patient Identification: Brittany [name] MRN:  [id number] Date of Evaluation:  10/22/2015 TX #: 2  MADRS:   MMSE:   P.E. Findings:  No new physical findings vital signs stable  Psychiatric Interval Note:  Mood is feeling better possibly a little euphoric  Subjective:  Patient is a 30 y.o. female seen for evaluation for Electroconvulsive Therapy. Had some soreness after last treatment  Treatment Summary:   [x]   Right Unilateral             []  Bilateral   % Energy : 0.3 ms 30%   Impedance: 2200 ohms  Seizure Energy Index: 12,340 V squared  Postictal Suppression Index: 87%  Seizure Concordance Index: 96%  Medications  Pre Shock: Xylocaine 4 mg, Toradol 30 mg, Zofran 4 mg, Brevital 100 mg, succinylcholine 120 mg  Post Shock: Versed 2 mg, Haldol 5 mg  Seizure Duration: 36 seconds by EMG, 92 seconds by EEG   Comments: Patient appears to be better possibly slightly euphoric we will keep a close eye on this and see her back for her next scheduled treatment on Friday   Lungs:  [x]   Clear to auscultation               []  Other:   Heart:    [x]   Regular rhythm             []  irregular rhythm    [x]   Previous H&P reviewed, patient examined and there are NO CHANGES                 []   Previous H&P reviewed, patient examined and there are changes noted.   Mordecai RasmussenJohn Kannen Moxey, MD 1/11/201710:35 AM

## 2015-10-24 ENCOUNTER — Encounter (HOSPITAL_BASED_OUTPATIENT_CLINIC_OR_DEPARTMENT_OTHER)
Admission: RE | Admit: 2015-10-24 | Discharge: 2015-10-24 | Disposition: A | Discharge status: Home / Self Care | Payer: Medicaid Other | Source: Ambulatory Visit | Attending: Psychiatry | Admitting: Psychiatry

## 2015-10-24 ENCOUNTER — Encounter: Payer: Self-pay | Admitting: Anesthesiology

## 2015-10-24 DIAGNOSIS — F333 Major depressive disorder, recurrent, severe with psychotic symptoms: Secondary | ICD-10-CM | POA: Diagnosis not present

## 2015-10-24 DIAGNOSIS — F3181 Bipolar II disorder: Secondary | ICD-10-CM

## 2015-10-24 MED ORDER — ONDANSETRON HCL 4 MG/2ML IJ SOLN
4.0000 mg | Freq: Once | INTRAMUSCULAR | Status: AC
  Administered 2015-10-24: 4 mg via INTRAVENOUS

## 2015-10-24 MED ORDER — KETOROLAC TROMETHAMINE 30 MG/ML IJ SOLN
30.0000 mg | Freq: Once | INTRAMUSCULAR | Status: AC
  Administered 2015-10-24: 30 mg via INTRAVENOUS

## 2015-10-24 MED ORDER — LIDOCAINE HCL (CARDIAC) 20 MG/ML IV SOLN
4.0000 mg | Freq: Once | INTRAVENOUS | Status: AC
  Administered 2015-10-24: 4 mg via INTRAVENOUS

## 2015-10-24 MED ORDER — MIDAZOLAM HCL 2 MG/2ML IJ SOLN
2.0000 mg | Freq: Once | INTRAMUSCULAR | Status: AC
Start: 2015-10-24 — End: 2015-10-24
  Administered 2015-10-24: 2 mg via INTRAVENOUS

## 2015-10-24 MED ORDER — SUCCINYLCHOLINE CHLORIDE 20 MG/ML IJ SOLN
120.0000 mg | Freq: Once | INTRAMUSCULAR | Status: AC
  Administered 2015-10-24: 120 mg via INTRAVENOUS

## 2015-10-24 MED ORDER — METHOHEXITAL SODIUM 100 MG/10ML IV SOSY
100.0000 mg | PREFILLED_SYRINGE | Freq: Once | INTRAVENOUS | Status: AC
  Administered 2015-10-24: 100 mg via INTRAVENOUS

## 2015-10-24 MED ORDER — HALOPERIDOL LACTATE 5 MG/ML IJ SOLN
5.0000 mg | Freq: Once | INTRAMUSCULAR | Status: AC
  Administered 2015-10-24: 5 mg via INTRAVENOUS
  Filled 2015-10-24: qty 1

## 2015-10-24 MED ORDER — DEXTROSE 5 % IV SOLN
250.0000 mL | Freq: Once | INTRAVENOUS | Status: AC
  Administered 2015-10-24: 250 mL via INTRAVENOUS

## 2015-10-24 NOTE — Procedures (Signed)
ECT SERVICES Physician's Interval Evaluation & Treatment Note  Patient Identification: Brittany [name] MRN:  [id number] Date of Evaluation:  10/24/2015 TX #: 3  MADRS:   MMSE:   P.E. Findings:  No physical changes vital signs stable. Physical exam normal.  Psychiatric Interval Note:  Mood is improved. Not feeling depressed sign of psychosis  Subjective:  Patient is a 30 y.o. female seen for evaluation for Electroconvulsive Therapy. No new complaint  Treatment Summary:   [x]   Right Unilateral             []  Bilateral   % Energy : 0.3 ms 30%   Impedance: 1530 ohms  Seizure Energy Index: 13,505 V squared  Postictal Suppression Index: 41%  Seizure Concordance Index: 97%  Medications  Pre Shock: Xylocaine 4 mg, Toradol 30 mg, Zofran 4 mg, Brevital 100 mg, succinylcholine 120 mg  Post Shock: Versed 2 mg, Haldol 5 mg  Seizure Duration: 50 seconds by EMG, 54 seconds by EEG   Comments: Tolerating treatment well. Follow-up in 3 days on Monday.   Lungs:  [x]   Clear to auscultation               []  Other:   Heart:    [x]   Regular rhythm             []  irregular rhythm    [x]   Previous H&P reviewed, patient examined and there are NO CHANGES                 []   Previous H&P reviewed, patient examined and there are changes noted.   Mordecai RasmussenJohn Cresencio Reesor, MD 1/13/201711:08 AM

## 2015-10-24 NOTE — OR Nursing (Signed)
Patient to pacu very restless and non responsive to questions..Marland Kitchen

## 2015-10-24 NOTE — Anesthesia Preprocedure Evaluation (Signed)
Anesthesia Evaluation  Patient identified by MRN, date of birth, ID band Patient awake    Reviewed: Allergy & Precautions, NPO status , Patient's Chart, lab work & pertinent test results  Airway Mallampati: II       Dental  (+) Teeth Intact   Pulmonary neg pulmonary ROS, former smoker,    breath sounds clear to auscultation       Cardiovascular negative cardio ROS   Rhythm:Regular     Neuro/Psych  Headaches, PSYCHIATRIC DISORDERS Anxiety Depression Bipolar Disorder    GI/Hepatic negative GI ROS, Neg liver ROS,   Endo/Other  Morbid obesity  Renal/GU Renal InsufficiencyRenal diseasenegative Renal ROS  negative genitourinary   Musculoskeletal negative musculoskeletal ROS (+) Arthritis , Osteoarthritis,    Abdominal (+) + obese,   Peds negative pediatric ROS (+)  Hematology negative hematology ROS (+)   Anesthesia Other Findings   Reproductive/Obstetrics                             Anesthesia Physical  Anesthesia Plan  ASA: II  Anesthesia Plan: General   Post-op Pain Management:    Induction: Intravenous  Airway Management Planned: Simple Face Mask  Additional Equipment:   Intra-op Plan:   Post-operative Plan:   Informed Consent: I have reviewed the patients History and Physical, chart, labs and discussed the procedure including the risks, benefits and alternatives for the proposed anesthesia with the patient or authorized representative who has indicated his/her understanding and acceptance.     Plan Discussed with: CRNA  Anesthesia Plan Comments:         Anesthesia Quick Evaluation                                  Anesthesia Evaluation  Patient identified by MRN, date of birth, ID band Patient awake    Reviewed: Allergy & Precautions, NPO status , Patient's Chart, lab work & pertinent test results  Airway Mallampati: II       Dental  (+) Teeth Intact   Pulmonary neg pulmonary ROS, former smoker,    breath sounds clear to auscultation       Cardiovascular negative cardio ROS   Rhythm:Regular     Neuro/Psych  Headaches, PSYCHIATRIC DISORDERS Anxiety Depression Bipolar Disorder    GI/Hepatic negative GI ROS, Neg liver ROS,   Endo/Other  Morbid obesity  Renal/GU Renal InsufficiencyRenal diseasenegative Renal ROS  negative genitourinary   Musculoskeletal negative musculoskeletal ROS (+) Arthritis , Osteoarthritis,    Abdominal (+) + obese,   Peds negative pediatric ROS (+)  Hematology negative hematology ROS (+)   Anesthesia Other Findings   Reproductive/Obstetrics                             Anesthesia Physical  Anesthesia Plan  ASA: II  Anesthesia Plan: General   Post-op Pain Management:    Induction: Intravenous  Airway Management Planned: Simple Face Mask  Additional Equipment:   Intra-op Plan:   Post-operative Plan:   Informed Consent: I have reviewed the patients History and Physical, chart, labs and discussed the procedure including the risks, benefits and alternatives for the proposed anesthesia with the patient or authorized representative who has indicated his/her understanding and acceptance.     Plan Discussed with: CRNA  Anesthesia Plan Comments:  Anesthesia Quick Evaluation

## 2015-10-24 NOTE — Anesthesia Postprocedure Evaluation (Signed)
Anesthesia Post Note  Patient: Brittany [name]  Procedure(s) Performed: * No procedures listed *  Patient location during evaluation: PACU Anesthesia Type: General Level of consciousness: awake and alert Pain management: pain level controlled Vital Signs Assessment: post-procedure vital signs reviewed and stable Respiratory status: spontaneous breathing and respiratory function stable Cardiovascular status: stable Anesthetic complications: no    Last Vitals:  Filed Vitals:   10/24/15 1155 10/24/15 1203  BP: 94/63 114/78  Pulse: 94 100  Temp:    Resp: 17 18    Last Pain:  Filed Vitals:   10/24/15 1211  PainSc: 0-No pain                 [name] K

## 2015-10-24 NOTE — Transfer of Care (Signed)
Immediate Anesthesia Transfer of Care Note  Patient: Brittany [name]  Procedure(s) Performed ECT  Patient Location: PACU  Anesthesia Type:General  Level of Consciousness: sedated and patient cooperative  Airway & Oxygen Therapy: Patient Spontanous Breathing  Post-op Assessment: Report given to RN  Post vital signs: Reviewed and stable  Last Vitals:  Filed Vitals:   10/24/15 0850 10/24/15 1125  BP: 130/89 103/58  Pulse: 85 87  Temp: 35.9 C 37.4 C  Resp:  17    Complications: No apparent anesthesia complications

## 2015-10-24 NOTE — H&P (Signed)
Brittany BuffyChristine [name] is an 30 y.o. female.   Chief Complaint: Patient reports mood is feeling better. Sleeping adequately at night. No new physical complaints. Slight concern about hypomania but no psychosis HPI: A shot with a history of bipolar disorder type II receiving ECT treatment tolerating treatment well appears to be in a much better mood.  Past Medical History  Diagnosis Date  . ADHD (attention deficit hyperactivity disorder)   . Bipolar disorder (HCC)   . Anxiety   . Depression   . Chronic kidney disease     Kidney stones  . Headache   . Arthritis     Past Surgical History  Procedure Laterality Date  . Foot surgery    . Tubal ligation    . Cesarean section    . Shoulder surgery Right   . Kidney surgery    . Novasure ablation  2010    Family History  Problem Relation Age of Onset  . Anxiety disorder Mother   . Depression Mother   . Drug abuse Mother   . Bipolar disorder Mother   . Depression Father   . Anxiety disorder Father   . Drug abuse Father   . Anxiety disorder Sister   . Depression Sister   . ADD / ADHD Sister   . Anxiety disorder Sister   . Depression Sister    Social History:  reports that she has quit smoking. Her smoking use included Cigarettes. She has never used smokeless tobacco. She reports that she drinks about 2.4 - 7.2 oz of alcohol per week. She reports that she uses illicit drugs (Marijuana).  Allergies:  Allergies  Allergen Reactions  . Ceclor [Cefaclor] Hives    "vomiting"     (Not in a hospital admission)  No results found for this or any previous visit (from the past 48 hour(s)). No results found.  Review of Systems  Constitutional: Negative.   HENT: Negative.   Eyes: Negative.   Respiratory: Negative.   Cardiovascular: Negative.   Gastrointestinal: Negative.   Musculoskeletal: Negative.   Skin: Negative.   Neurological: Negative.   Psychiatric/Behavioral: Negative for depression, suicidal ideas, hallucinations, memory  loss and substance abuse. The patient is not nervous/anxious and does not have insomnia.     Blood pressure 130/89, pulse 85, temperature 96.6 F (35.9 C), temperature source Oral, height 5\' 4"  (1.626 m), weight 111.585 kg (246 lb), SpO2 99 %. Physical Exam  Nursing note and vitals reviewed. Constitutional: She appears well-developed and well-nourished.  HENT:  Head: Normocephalic and atraumatic.  Eyes: Conjunctivae are normal. Pupils are equal, round, and reactive to light.  Neck: Normal range of motion.  Cardiovascular: Normal rate, regular rhythm and normal heart sounds.   Respiratory: Effort normal and breath sounds normal. No respiratory distress.  GI: Soft.  Musculoskeletal: Normal range of motion.  Neurological: She is alert.  Skin: Skin is warm and dry.  Psychiatric: She has a normal mood and affect. Her behavior is normal. Judgment and thought content normal.     Assessment/Plan Patient will receive treatment today and we will follow up on Monday. If it appears that she has stabilize we may consider switching to maintenance depending on evaluation Monday.  Dorothee Napierkowski 10/24/2015, 11:06 AM

## 2015-10-24 NOTE — Anesthesia Procedure Notes (Signed)
Date/Time: 10/24/2015 11:14 AM Performed by: Lily KocherPERALTA, Renay Crammer Pre-anesthesia Checklist: Patient identified, Emergency Drugs available, Suction available and Patient being monitored Patient Re-evaluated:Patient Re-evaluated prior to inductionOxygen Delivery Method: Circle system utilized Preoxygenation: Pre-oxygenation with 100% oxygen Intubation Type: IV induction Ventilation: Mask ventilation without difficulty and Mask ventilation throughout procedure Airway Equipment and Method: Bite block Placement Confirmation: positive ETCO2 Dental Injury: Teeth and Oropharynx as per pre-operative assessment

## 2015-10-27 ENCOUNTER — Encounter
Admission: RE | Admit: 2015-10-27 | Discharge: 2015-10-27 | Disposition: A | Discharge status: Home / Self Care | Payer: Medicaid Other | Source: Ambulatory Visit | Attending: Psychiatry | Admitting: Psychiatry

## 2015-10-27 ENCOUNTER — Encounter: Payer: Self-pay | Admitting: Anesthesiology

## 2015-10-27 ENCOUNTER — Encounter: Payer: Medicaid Other | Admitting: Anesthesiology

## 2015-10-27 DIAGNOSIS — F419 Anxiety disorder, unspecified: Secondary | ICD-10-CM | POA: Insufficient documentation

## 2015-10-27 DIAGNOSIS — Z888 Allergy status to other drugs, medicaments and biological substances status: Secondary | ICD-10-CM | POA: Insufficient documentation

## 2015-10-27 DIAGNOSIS — F319 Bipolar disorder, unspecified: Secondary | ICD-10-CM | POA: Insufficient documentation

## 2015-10-27 DIAGNOSIS — Z87442 Personal history of urinary calculi: Secondary | ICD-10-CM | POA: Diagnosis not present

## 2015-10-27 DIAGNOSIS — M1990 Unspecified osteoarthritis, unspecified site: Secondary | ICD-10-CM | POA: Diagnosis not present

## 2015-10-27 DIAGNOSIS — F909 Attention-deficit hyperactivity disorder, unspecified type: Secondary | ICD-10-CM | POA: Insufficient documentation

## 2015-10-27 DIAGNOSIS — Z87891 Personal history of nicotine dependence: Secondary | ICD-10-CM | POA: Insufficient documentation

## 2015-10-27 DIAGNOSIS — N189 Chronic kidney disease, unspecified: Secondary | ICD-10-CM | POA: Diagnosis not present

## 2015-10-27 DIAGNOSIS — F329 Major depressive disorder, single episode, unspecified: Secondary | ICD-10-CM | POA: Insufficient documentation

## 2015-10-27 DIAGNOSIS — F332 Major depressive disorder, recurrent severe without psychotic features: Secondary | ICD-10-CM | POA: Diagnosis present

## 2015-10-27 DIAGNOSIS — F3181 Bipolar II disorder: Secondary | ICD-10-CM | POA: Diagnosis not present

## 2015-10-27 MED ORDER — MIDAZOLAM HCL 2 MG/2ML IJ SOLN
2.0000 mg | Freq: Once | INTRAMUSCULAR | Status: AC
  Administered 2015-10-27: 2 mg via INTRAVENOUS

## 2015-10-27 MED ORDER — KETOROLAC TROMETHAMINE 30 MG/ML IJ SOLN
30.0000 mg | Freq: Once | INTRAMUSCULAR | Status: AC
Start: 2015-10-27 — End: 2015-10-27
  Administered 2015-10-27: 30 mg via INTRAVENOUS

## 2015-10-27 MED ORDER — SODIUM CHLORIDE 0.9 % IV SOLN
INTRAVENOUS | Status: DC | PRN
  Administered 2015-10-27: 11:00:00 via INTRAVENOUS

## 2015-10-27 MED ORDER — HALOPERIDOL LACTATE 5 MG/ML IJ SOLN
5.0000 mg | Freq: Four times a day (QID) | INTRAMUSCULAR | Status: DC | PRN
  Administered 2015-10-27: 5 mg via INTRAVENOUS
  Filled 2015-10-27 (×2): qty 1

## 2015-10-27 MED ORDER — METHOHEXITAL SODIUM 100 MG/10ML IV SOSY
100.0000 mg | PREFILLED_SYRINGE | Freq: Once | INTRAVENOUS | Status: AC
Start: 2015-10-27 — End: 2015-10-27
  Administered 2015-10-27: 100 mg via INTRAVENOUS

## 2015-10-27 MED ORDER — DEXTROSE 5 % IV SOLN
250.0000 mL | Freq: Once | INTRAVENOUS | Status: AC
Start: 2015-10-27 — End: 2015-10-27
  Administered 2015-10-27: 250 mL via INTRAVENOUS

## 2015-10-27 MED ORDER — LIDOCAINE HCL (CARDIAC) 20 MG/ML IV SOLN
4.0000 mg | Freq: Once | INTRAVENOUS | Status: AC
Start: 2015-10-27 — End: 2015-10-27
  Administered 2015-10-27: 4 mg via INTRAVENOUS

## 2015-10-27 MED ORDER — SUCCINYLCHOLINE CHLORIDE 20 MG/ML IJ SOLN
120.0000 mg | Freq: Once | INTRAMUSCULAR | Status: AC
Start: 2015-10-27 — End: 2015-10-27
  Administered 2015-10-27: 120 mg via INTRAVENOUS

## 2015-10-27 MED ORDER — ONDANSETRON HCL 4 MG/2ML IJ SOLN
4.0000 mg | Freq: Once | INTRAMUSCULAR | Status: AC
  Administered 2015-10-27: 4 mg via INTRAVENOUS

## 2015-10-27 NOTE — Anesthesia Preprocedure Evaluation (Signed)
Anesthesia Evaluation  Patient identified by MRN, date of birth, ID band Patient awake    Reviewed: Allergy & Precautions, NPO status , Patient's Chart, lab work & pertinent test results  Airway Mallampati: II  TM Distance: >3 FB Neck ROM: Full    Dental  (+) Teeth Intact   Pulmonary former smoker,    Pulmonary exam normal        Cardiovascular Exercise Tolerance: Good Normal cardiovascular exam     Neuro/Psych Anxiety Depression Bipolar Disorder    GI/Hepatic   Endo/Other    Renal/GU Hx of stones.     Musculoskeletal   Abdominal (+) + obese,  Abdomen: soft.    Peds  Hematology   Anesthesia Other Findings   Reproductive/Obstetrics                             Anesthesia Physical Anesthesia Plan  ASA: III  Anesthesia Plan: General   Post-op Pain Management:    Induction: Intravenous  Airway Management Planned: Mask  Additional Equipment:   Intra-op Plan:   Post-operative Plan:   Informed Consent: I have reviewed the patients History and Physical, chart, labs and discussed the procedure including the risks, benefits and alternatives for the proposed anesthesia with the patient or authorized representative who has indicated his/her understanding and acceptance.     Plan Discussed with: CRNA  Anesthesia Plan Comments:         Anesthesia Quick Evaluation

## 2015-10-27 NOTE — Anesthesia Postprocedure Evaluation (Signed)
Anesthesia Post Note  Patient: Brittany [name]  Procedure(s) Performed: * No procedures listed *  Patient location during evaluation: PACU Level of consciousness: awake Pain management: pain level controlled Vital Signs Assessment: post-procedure vital signs reviewed and stable Respiratory status: spontaneous breathing Cardiovascular status: blood pressure returned to baseline Postop Assessment: no headache Anesthetic complications: no    Last Vitals:  Filed Vitals:   10/27/15 1215 10/27/15 1225  BP: 97/71 109/73  Pulse: 107 98  Temp:    Resp: 15 16    Last Pain:  Filed Vitals:   10/27/15 1227  PainSc: Asleep                 Joram Venson M

## 2015-10-27 NOTE — Transfer of Care (Signed)
Immediate Anesthesia Transfer of Care Note  Patient: Brittany [name]  Procedure(s) Performed: * No procedures listed *  Patient Location: PACU  Anesthesia Type:General  Level of Consciousness: patient cooperative and lethargic  Airway & Oxygen Therapy: Patient Spontanous Breathing and Patient connected to face mask oxygen  Post-op Assessment: Report given to RN and Post -op Vital signs reviewed and stable  Post vital signs: Reviewed and stable  Last Vitals:  Filed Vitals:   10/27/15 0840 10/27/15 1125  BP: 120/83 108/40  Pulse: 89   Temp: 35.4 C 36.2 C  Resp: 18 22    Complications: No apparent anesthesia complications

## 2015-10-27 NOTE — H&P (Signed)
Brittany [name] is an 30 y.o. female.   Chief Complaint: Patient's mood is feeling good. Does not appear to be manic. Not feeling depressed no suicidal ideation. HPI: Patient tolerating treatment well. 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 ADHD (attention deficit hyperactivity disorder)   . Bipolar disorder (HCC)   . Anxiety   . Depression   . Chronic kidney disease     Kidney stones  . Headache   . Arthritis     Past Surgical History  Procedure Laterality Date  . Foot surgery    . Tubal ligation    . Cesarean section    . Shoulder surgery Right   . Kidney surgery    . Novasure ablation  2010    Family History  Problem Relation Age of Onset  . Anxiety disorder Mother   . Depression Mother   . Drug abuse Mother   . Bipolar disorder Mother   . Depression Father   . Anxiety disorder Father   . Drug abuse Father   . Anxiety disorder Sister   . Depression Sister   . ADD / ADHD Sister   . Anxiety disorder Sister   . Depression Sister    Social History:  reports that she has quit smoking. Her smoking use included Cigarettes. She has never used smokeless tobacco. She reports that she drinks about 2.4 - 7.2 oz of alcohol per week. She reports that she uses illicit drugs (Marijuana).  Allergies:  Allergies  Allergen Reactions  . Ceclor [Cefaclor] Hives    "vomiting"     (Not in a hospital admission)  No results found for this or any previous visit (from the past 48 hour(s)). No results found.  Review of Systems  Constitutional: Negative.   HENT: Negative.   Eyes: Negative.   Respiratory: Negative.   Cardiovascular: Negative.   Gastrointestinal: Negative.   Musculoskeletal: Negative.   Skin: Negative.   Neurological: Negative.   Psychiatric/Behavioral: Negative for depression, suicidal ideas, hallucinations, memory loss and substance abuse. The patient is not nervous/anxious and does not have  insomnia.     Blood pressure 120/83, pulse 89, temperature 95.7 F (35.4 C), temperature source Oral, resp. rate 18, height 5\' 4"  (1.626 m), weight 107.956 kg (238 lb), SpO2 100 %. Physical Exam  Nursing note and vitals reviewed. Constitutional: She appears well-developed and well-nourished.  HENT:  Head: Normocephalic and atraumatic.  Eyes: Conjunctivae are normal. Pupils are equal, round, and reactive to light.  Neck: Normal range of motion.  Cardiovascular: Normal rate, regular rhythm and normal heart sounds.   Respiratory: Effort normal and breath sounds normal. No respiratory distress.  GI: Soft.  Musculoskeletal: Normal range of motion.  Neurological: She is alert.  Skin: Skin is warm and dry.  Psychiatric: She has a normal mood and affect. Her behavior is normal. Judgment and thought content normal.     Assessment/Plan Continue ECT today but we will switch to once a week treatment with possible taper to maintenance. Follow-up in one week on the 23rd  Brittany [name] 10/27/2015, 11:04 AM

## 2015-10-27 NOTE — Procedures (Signed)
ECT SERVICES Physician's Interval Evaluation & Treatment Note  Patient Identification: Brittany [name] MRN:  [id number] Date of Evaluation:  10/27/2015 TX #: 4  MADRS: 10  MMSE: 30  P.E. Findings:  No new physical complaint.  Psychiatric Interval Note:  Mood is much better stable no anxiety related to treatment. Doing very well. Return of depressive symptoms  Subjective:  Patient is a 30 y.o. female seen for evaluation for Electroconvulsive Therapy. No specific complaint  Treatment Summary:   [x]   Right Unilateral             []  Bilateral   % Energy : 0.3 ms 30%   Impedance: 1360 ohms  Seizure Energy Index: 6200 V squared  Postictal Suppression Index: 94%  Seizure Concordance Index: 94%  Medications  Pre Shock: Xylocaine 4 mg, Toradol 30 mg, Zofran 4 mg, Brevital 100 mg, succinylcholine 120 mg  Post Shock: Haldol 5 mg Versed 2 mg  Seizure Duration: 36 seconds by EMG, 38 seconds by EEG   Comments: Patient has done quite well and appears to have a rating score in the insignificant range. Plan is to discontinue 3 times a week treatment we will see her back in 1 week for possible taper into maintenance. Patient agrees to the plan.   Lungs:  [x]   Clear to auscultation               []  Other:   Heart:    [x]   Regular rhythm             []  irregular rhythm    [x]   Previous H&P reviewed, patient examined and there are NO CHANGES                 []   Previous H&P reviewed, patient examined and there are changes noted.   Mordecai RasmussenJohn Clapacs, MD 1/16/201711:06 AM

## 2015-11-03 ENCOUNTER — Encounter: Payer: Self-pay | Admitting: *Deleted

## 2015-11-03 ENCOUNTER — Encounter
Admission: RE | Admit: 2015-11-03 | Discharge: 2015-11-03 | Disposition: A | Discharge status: Home / Self Care | Payer: Medicaid Other | Source: Ambulatory Visit | Attending: Psychiatry | Admitting: Psychiatry

## 2015-11-03 ENCOUNTER — Encounter: Payer: Medicaid Other | Admitting: *Deleted

## 2015-11-03 DIAGNOSIS — M1990 Unspecified osteoarthritis, unspecified site: Secondary | ICD-10-CM | POA: Diagnosis not present

## 2015-11-03 DIAGNOSIS — F909 Attention-deficit hyperactivity disorder, unspecified type: Secondary | ICD-10-CM | POA: Diagnosis not present

## 2015-11-03 DIAGNOSIS — N189 Chronic kidney disease, unspecified: Secondary | ICD-10-CM | POA: Insufficient documentation

## 2015-11-03 DIAGNOSIS — Z87891 Personal history of nicotine dependence: Secondary | ICD-10-CM | POA: Diagnosis not present

## 2015-11-03 DIAGNOSIS — Z87442 Personal history of urinary calculi: Secondary | ICD-10-CM | POA: Diagnosis not present

## 2015-11-03 DIAGNOSIS — F3181 Bipolar II disorder: Secondary | ICD-10-CM | POA: Diagnosis not present

## 2015-11-03 DIAGNOSIS — F329 Major depressive disorder, single episode, unspecified: Secondary | ICD-10-CM | POA: Diagnosis not present

## 2015-11-03 DIAGNOSIS — Z881 Allergy status to other antibiotic agents status: Secondary | ICD-10-CM | POA: Diagnosis not present

## 2015-11-03 MED ORDER — SUCCINYLCHOLINE 20MG/ML (10ML) SYRINGE FOR MEDFUSION PUMP - OPTIME
INTRAMUSCULAR | Status: DC | PRN
  Administered 2015-11-03: 120 mg via INTRAVENOUS

## 2015-11-03 MED ORDER — KETOROLAC TROMETHAMINE 30 MG/ML IJ SOLN
30.0000 mg | Freq: Once | INTRAMUSCULAR | Status: AC
  Administered 2015-11-03: 30 mg via INTRAVENOUS

## 2015-11-03 MED ORDER — DEXTROSE 5 % IV SOLN
250.0000 mL | Freq: Once | INTRAVENOUS | Status: AC
  Administered 2015-11-03: 250 mL via INTRAVENOUS

## 2015-11-03 MED ORDER — MIDAZOLAM HCL 2 MG/2ML IJ SOLN
2.0000 mg | Freq: Once | INTRAMUSCULAR | Status: AC
  Administered 2015-11-03: 2 mg via INTRAVENOUS

## 2015-11-03 MED ORDER — SODIUM CHLORIDE 0.9 % IV SOLN
INTRAVENOUS | Status: DC | PRN
  Administered 2015-11-03: 11:00:00 via INTRAVENOUS

## 2015-11-03 MED ORDER — ONDANSETRON HCL 4 MG/2ML IJ SOLN
4.0000 mg | Freq: Once | INTRAMUSCULAR | Status: AC
  Administered 2015-11-03: 4 mg via INTRAVENOUS

## 2015-11-03 NOTE — Anesthesia Preprocedure Evaluation (Signed)
Anesthesia Evaluation  Patient identified by MRN, date of birth, ID band Patient awake    Reviewed: Allergy & Precautions, NPO status   Airway Mallampati: II  TM Distance: >3 FB Neck ROM: Full    Dental  (+) Teeth Intact   Pulmonary former smoker,    Pulmonary exam normal        Cardiovascular Exercise Tolerance: Good Normal cardiovascular exam     Neuro/Psych    GI/Hepatic   Endo/Other    Renal/GU      Musculoskeletal   Abdominal (+) + obese,  Abdomen: soft.    Peds  Hematology   Anesthesia Other Findings   Reproductive/Obstetrics                             Anesthesia Physical Anesthesia Plan  ASA: III  Anesthesia Plan: General   Post-op Pain Management:    Induction: Intravenous  Airway Management Planned: Mask  Additional Equipment:   Intra-op Plan:   Post-operative Plan:   Informed Consent: I have reviewed the patients History and Physical, chart, labs and discussed the procedure including the risks, benefits and alternatives for the proposed anesthesia with the patient or authorized representative who has indicated his/her understanding and acceptance.     Plan Discussed with: CRNA  Anesthesia Plan Comments:         Anesthesia Quick Evaluation

## 2015-11-03 NOTE — Transfer of Care (Signed)
Immediate Anesthesia Transfer of Care Note  Patient: Brittany [name]  Procedure(s) Performed: * No procedures listed *  Patient Location: PACU  Anesthesia Type:General  Level of Consciousness: sedated  Airway & Oxygen Therapy: Patient Spontanous Breathing and Patient connected to face mask oxygen  Post-op Assessment: Report given to RN and Post -op Vital signs reviewed and stable  Post vital signs: Reviewed and stable  Last Vitals:  Filed Vitals:   11/03/15 0927 11/03/15 1054  BP: 126/84 115/76  Pulse: 80 99  Temp: 36.4 C 36.4 C  Resp:  15    Complications: No apparent anesthesia complications

## 2015-11-03 NOTE — Anesthesia Postprocedure Evaluation (Signed)
Anesthesia Post Note  Patient: Brittany [name]  Procedure(s) Performed: * No procedures listed *  Patient location during evaluation: PACU Anesthesia Type: General Level of consciousness: awake Pain management: pain level controlled Vital Signs Assessment: post-procedure vital signs reviewed and stable Respiratory status: spontaneous breathing Cardiovascular status: blood pressure returned to baseline Postop Assessment: no headache Anesthetic complications: no    Last Vitals:  Filed Vitals:   11/03/15 1105 11/03/15 1115  BP: 111/76 114/76  Pulse: 94 89  Temp:    Resp: 16 19    Last Pain:  Filed Vitals:   11/03/15 1116  PainSc: 0-No pain                 Alita Waldren M

## 2015-11-03 NOTE — H&P (Signed)
Brittany [name] is an 30 y.o. female.   Chief Complaint: Patient is not reporting physical complaints. Mood is more irritable today. Has some situational circumstances HPI: Physically and normal condition no change to physical exam  Past Medical History  Diagnosis Date  . ADHD (attention deficit hyperactivity disorder)   . Bipolar disorder (HCC)   . Anxiety   . Depression   . Chronic kidney disease     Kidney stones  . Headache   . Arthritis     Past Surgical History  Procedure Laterality Date  . Foot surgery    . Tubal ligation    . Cesarean section    . Shoulder surgery Right   . Kidney surgery    . Novasure ablation  2010    Family History  Problem Relation Age of Onset  . Anxiety disorder Mother   . Depression Mother   . Drug abuse Mother   . Bipolar disorder Mother   . Depression Father   . Anxiety disorder Father   . Drug abuse Father   . Anxiety disorder Sister   . Depression Sister   . ADD / ADHD Sister   . Anxiety disorder Sister   . Depression Sister    Social History:  reports that she has quit smoking. Her smoking use included Cigarettes. She has never used smokeless tobacco. She reports that she drinks about 2.4 - 7.2 oz of alcohol per week. She reports that she uses illicit drugs (Marijuana).  Allergies:  Allergies  Allergen Reactions  . Ceclor [Cefaclor] Hives    "vomiting"     (Not in a hospital admission)  No results found for this or any previous visit (from the past 48 hour(s)). No results found.  Review of Systems  Constitutional: Negative.   HENT: Negative.   Eyes: Negative.   Respiratory: Negative.   Cardiovascular: Negative.   Gastrointestinal: Negative.   Musculoskeletal: Negative.   Skin: Negative.   Neurological: Negative.   Psychiatric/Behavioral: Positive for depression. Negative for suicidal ideas, hallucinations, memory loss and substance abuse. The patient is nervous/anxious. The patient does not have insomnia.      Blood pressure 126/84, pulse 80, temperature 97.6 F (36.4 C), temperature source Oral, weight 115.214 kg (254 lb), SpO2 98 %. Physical Exam  Nursing note and vitals reviewed. Constitutional: She appears well-developed and well-nourished.  HENT:  Head: Normocephalic and atraumatic.  Eyes: Conjunctivae are normal. Pupils are equal, round, and reactive to light.  Neck: Normal range of motion.  Cardiovascular: Normal rate, regular rhythm and normal heart sounds.   Respiratory: Effort normal and breath sounds normal. No respiratory distress.  GI: Soft.  Musculoskeletal: Normal range of motion.  Neurological: She is alert.  Skin: Skin is warm and dry.  Psychiatric: She has a normal mood and affect. Her behavior is normal. Judgment and thought content normal.     Assessment/Plan Physically stable. ECT treatment today. Anticipate return in 1 week. Continue monitoring mood which appears to be largely reaction all to situation right now.  Brittany [name] 11/03/2015, 10:33 AM

## 2015-11-03 NOTE — Procedures (Signed)
ECT SERVICES Physician's Interval Evaluation & Treatment Note  Patient Identification: Shalon Salado MRN:  [id number] Date of Evaluation:  11/03/2015 TX #: 5  MADRS:   MMSE:   P.E. Findings:  No physical exam changes no physical complaints  Psychiatric Interval Note:  Mood is feeling a little more irritable and upset today for situational reasons  Subjective:  Patient is a 30 y.o. female seen for evaluation for Electroconvulsive Therapy. More irritable but no suicidal ideation no psychosis  Treatment Summary:     Right Unilateral              Bilateral   % Energy : 0.3 ms 50%   Impedance: 2050 ohms  Seizure Energy Index: 6054 V squared  Postictal Suppression Index: 43%  Seizure Concordance Index: 95%  Medications  Pre Shock: Brevital 100 mg, succinylcholine 120 mg. All medicine given by nurse anesthetist  Post Shock: Versed 2 mg, given by nurse anesthetist  Seizure Duration: 31 seconds observed motor movement, 45 seconds by EEG   Comments: Follow-up in one week on the 30th January   Lungs:    Clear to auscultation                Other:   Heart:      Regular rhythm              irregular rhythm      Previous H&P reviewed, patient examined and there are NO CHANGES                   Previous H&P reviewed, patient examined and there are changes noted.   Mordecai Rasmussen, MD 1/23/201710:37 AM

## 2015-11-03 NOTE — Anesthesia Procedure Notes (Signed)
Date/Time: 11/03/2015 10:35 AM Performed by: Junious Silk Pre-anesthesia Checklist: Patient identified, Timeout performed, Emergency Drugs available, Suction available and Patient being monitored Patient Re-evaluated:Patient Re-evaluated prior to induction

## 2015-11-10 ENCOUNTER — Encounter: Payer: Self-pay | Admitting: Anesthesiology

## 2015-11-10 ENCOUNTER — Encounter
Admission: RE | Admit: 2015-11-10 | Discharge: 2015-11-10 | Disposition: A | Discharge status: Home / Self Care | Payer: Medicaid Other | Source: Ambulatory Visit | Attending: Psychiatry | Admitting: Psychiatry

## 2015-11-10 ENCOUNTER — Encounter: Payer: Medicaid Other | Admitting: Anesthesiology

## 2015-11-10 DIAGNOSIS — F419 Anxiety disorder, unspecified: Secondary | ICD-10-CM | POA: Insufficient documentation

## 2015-11-10 DIAGNOSIS — M1990 Unspecified osteoarthritis, unspecified site: Secondary | ICD-10-CM | POA: Insufficient documentation

## 2015-11-10 DIAGNOSIS — F332 Major depressive disorder, recurrent severe without psychotic features: Secondary | ICD-10-CM | POA: Diagnosis not present

## 2015-11-10 DIAGNOSIS — Z87442 Personal history of urinary calculi: Secondary | ICD-10-CM | POA: Insufficient documentation

## 2015-11-10 DIAGNOSIS — Z87891 Personal history of nicotine dependence: Secondary | ICD-10-CM | POA: Insufficient documentation

## 2015-11-10 DIAGNOSIS — F319 Bipolar disorder, unspecified: Secondary | ICD-10-CM | POA: Diagnosis not present

## 2015-11-10 DIAGNOSIS — Z888 Allergy status to other drugs, medicaments and biological substances status: Secondary | ICD-10-CM | POA: Diagnosis not present

## 2015-11-10 DIAGNOSIS — N189 Chronic kidney disease, unspecified: Secondary | ICD-10-CM | POA: Diagnosis not present

## 2015-11-10 DIAGNOSIS — F909 Attention-deficit hyperactivity disorder, unspecified type: Secondary | ICD-10-CM | POA: Insufficient documentation

## 2015-11-10 MED ORDER — PROMETHAZINE HCL 25 MG/ML IJ SOLN
12.5000 mg | Freq: Once | INTRAMUSCULAR | Status: AC
  Administered 2015-11-10: 12.5 mg via INTRAVENOUS

## 2015-11-10 MED ORDER — METHOHEXITAL SODIUM 100 MG/10ML IV SOSY
PREFILLED_SYRINGE | INTRAVENOUS | Status: DC | PRN
  Administered 2015-11-10: 100 mg via INTRAVENOUS

## 2015-11-10 MED ORDER — PROMETHAZINE HCL 25 MG/ML IJ SOLN
INTRAMUSCULAR | Status: AC
  Administered 2015-11-10: 12.5 mg via INTRAVENOUS
  Filled 2015-11-10: qty 1

## 2015-11-10 MED ORDER — MIDAZOLAM HCL 5 MG/5ML IJ SOLN
INTRAMUSCULAR | Status: DC | PRN
  Administered 2015-11-10: 2 mg via INTRAVENOUS

## 2015-11-10 MED ORDER — SODIUM CHLORIDE 0.9 % IV SOLN
250.0000 mL | Freq: Once | INTRAVENOUS | Status: AC
  Administered 2015-11-10: 500 mL via INTRAVENOUS

## 2015-11-10 MED ORDER — MIDAZOLAM HCL 2 MG/2ML IJ SOLN: 2.0000 mg | Freq: Once | INTRAMUSCULAR | Status: DC

## 2015-11-10 MED ORDER — SUCCINYLCHOLINE CHLORIDE 20 MG/ML IJ SOLN
INTRAMUSCULAR | Status: DC | PRN
  Administered 2015-11-10: 120 mg via INTRAVENOUS

## 2015-11-10 MED ORDER — ONDANSETRON HCL 4 MG/2ML IJ SOLN
4.0000 mg | Freq: Once | INTRAMUSCULAR | Status: AC
  Administered 2015-11-10: 4 mg via INTRAVENOUS

## 2015-11-10 MED ORDER — KETOROLAC TROMETHAMINE 30 MG/ML IJ SOLN
30.0000 mg | Freq: Once | INTRAMUSCULAR | Status: AC
  Administered 2015-11-10: 30 mg via INTRAVENOUS

## 2015-11-10 NOTE — Procedures (Signed)
ECT SERVICES Physician's Interval Evaluation & Treatment Note  Patient Identification: Brittany [name] MRN:  [id number] Date of Evaluation:  11/10/2015 TX #: 6  MADRS:   MMSE:   P.E. Findings:  No change to physical exam  Psychiatric Interval Note:  Mood is more stable  Subjective:  Patient is a 30 y.o. female seen for evaluation for Electroconvulsive Therapy. No specific new complaints except that she had nausea after the treatment last time  Treatment Summary:     Right Unilateral              Bilateral   % Energy : 0.3 ms 50%   Impedance: 1620 ohms  Seizure Energy Index: 11,068 V squared  Postictal Suppression Index: 86%  Seizure Concordance Index: 98%  Medications  Pre Shock: Zofran 4 mg, Brevital 100 mg, succinylcholine 120 mg  Post Shock: Phenergan 12.5 mg  Seizure Duration: 32 seconds by EMG, 59 seconds by EEG   Comments: Follow-up treatment in 1 week on the sixth day of February   Lungs:    Clear to auscultation                Other:   Heart:      Regular rhythm              irregular rhythm      Previous H&P reviewed, patient examined and there are NO CHANGES                   Previous H&P reviewed, patient examined and there are changes noted.   Mordecai Rasmussen, MD 1/30/201711:33 AM

## 2015-11-10 NOTE — Transfer of Care (Signed)
Immediate Anesthesia Transfer of Care Note  Patient: Brittany [name]  Procedure(s) Performed: * No procedures listed *  Patient Location: PACU  Anesthesia Type:General  Level of Consciousness: awake and patient cooperative  Airway & Oxygen Therapy: Patient Spontanous Breathing and Patient connected to nasal cannula oxygen  Post-op Assessment: Report given to RN and Post -op Vital signs reviewed and stable  Post vital signs: Reviewed and stable  Last Vitals:  Filed Vitals:   11/10/15 0858  BP: 142/94  Pulse: 100  Temp: 36.3 C  Resp: 18    Complications: No apparent anesthesia complications

## 2015-11-10 NOTE — Anesthesia Preprocedure Evaluation (Addendum)
Anesthesia Evaluation  Patient identified by MRN, date of birth, ID band Patient awake    Reviewed: Allergy & Precautions, H&P , NPO status , Patient's Chart, lab work & pertinent test results  History of Anesthesia Complications Negative for: history of anesthetic complications  Airway Mallampati: III  TM Distance: >3 FB Neck ROM: Full    Dental  (+) Poor Dentition   Pulmonary neg shortness of breath, former smoker,    Pulmonary exam normal        Cardiovascular Exercise Tolerance: Good Normal cardiovascular exam     Neuro/Psych  Headaches, PSYCHIATRIC DISORDERS Anxiety Depression Bipolar Disorder    GI/Hepatic neg GERD  ,  Endo/Other    Renal/GU Renal disease     Musculoskeletal  (+) Arthritis ,   Abdominal (+) + obese,  Abdomen: soft.    Peds  Hematology   Anesthesia Other Findings   Reproductive/Obstetrics                            Anesthesia Physical  Anesthesia Plan  ASA: III  Anesthesia Plan: General   Post-op Pain Management:    Induction: Intravenous  Airway Management Planned: Mask  Additional Equipment:   Intra-op Plan:   Post-operative Plan:   Informed Consent: I have reviewed the patients History and Physical, chart, labs and discussed the procedure including the risks, benefits and alternatives for the proposed anesthesia with the patient or authorized representative who has indicated his/her understanding and acceptance.     Plan Discussed with: CRNA  Anesthesia Plan Comments:         Anesthesia Quick Evaluation

## 2015-11-10 NOTE — H&P (Signed)
Brittany [name] is an 30 y.o. female.   Chief Complaint: Patient's mood is feeling more stable. Not severely depressed. Appears to be euthymic. HPI: Patient receiving ECT for major depression or bipolar depression appears to have stabilized. Now tapering off  Past Medical History  Diagnosis Date  . ADHD (attention deficit hyperactivity disorder)   . Bipolar disorder (HCC)   . Anxiety   . Depression   . Chronic kidney disease     Kidney stones  . Headache   . Arthritis     Past Surgical History  Procedure Laterality Date  . Foot surgery    . Tubal ligation    . Cesarean section    . Shoulder surgery Right   . Kidney surgery    . Novasure ablation  2010    Family History  Problem Relation Age of Onset  . Anxiety disorder Mother   . Depression Mother   . Drug abuse Mother   . Bipolar disorder Mother   . Depression Father   . Anxiety disorder Father   . Drug abuse Father   . Anxiety disorder Sister   . Depression Sister   . ADD / ADHD Sister   . Anxiety disorder Sister   . Depression Sister    Social History:  reports that she has quit smoking. Her smoking use included Cigarettes. She has never used smokeless tobacco. She reports that she drinks about 2.4 - 7.2 oz of alcohol per week. She reports that she uses illicit drugs (Marijuana).  Allergies:  Allergies  Allergen Reactions  . Ceclor [Cefaclor] Hives    "vomiting"     (Not in a hospital admission)  No results found for this or any previous visit (from the past 48 hour(s)). No results found.  Review of Systems  Constitutional: Negative.   HENT: Negative.   Eyes: Negative.   Respiratory: Negative.   Cardiovascular: Negative.   Gastrointestinal: Negative.   Musculoskeletal: Negative.   Skin: Negative.   Neurological: Negative.   Psychiatric/Behavioral: Negative for depression, suicidal ideas, hallucinations, memory loss and substance abuse. The patient is not nervous/anxious and does not have insomnia.      Blood pressure 142/94, pulse 100, temperature 97.3 F (36.3 C), temperature source Oral, resp. rate 18, height  (1.626 m), weight 114.76 kg (253 lb), SpO2 100 %. Physical Exam  Nursing note and vitals reviewed. Constitutional: She appears well-developed and well-nourished.  HENT:  Head: Normocephalic and atraumatic.  Eyes: Conjunctivae are normal. Pupils are equal, round, and reactive to light.  Neck: Normal range of motion.  Cardiovascular: Normal rate, regular rhythm and normal heart sounds.   Respiratory: Effort normal and breath sounds normal. No respiratory distress.  GI: Soft.  Musculoskeletal: Normal range of motion.  Neurological: She is alert.  Skin: Skin is warm and dry.  Psychiatric: She has a normal mood and affect. Her behavior is normal. Judgment and thought content normal.     Assessment/Plan Return for treatment next week for follow-up.  Brittany [name] 11/10/2015, 11:31 AM

## 2015-11-11 NOTE — Anesthesia Postprocedure Evaluation (Signed)
Anesthesia Post Note  Patient: Brittany [name]  Procedure(s) Performed: * No procedures listed *  Patient location during evaluation: PACU Level of consciousness: awake and alert and oriented Pain management: pain level controlled Vital Signs Assessment: post-procedure vital signs reviewed and stable Respiratory status: spontaneous breathing Cardiovascular status: blood pressure returned to baseline Anesthetic complications: no    Last Vitals:  Filed Vitals:   11/10/15 1303 11/10/15 1307  BP: 137/78   Pulse:  101  Temp:    Resp: 16     Last Pain:  Filed Vitals:   11/10/15 1312  PainSc: 0-No pain                 Naheem Mosco

## 2015-11-17 ENCOUNTER — Encounter: Payer: Medicaid Other | Admitting: Registered Nurse

## 2015-11-17 ENCOUNTER — Encounter: Payer: Self-pay | Admitting: Registered Nurse

## 2015-11-17 ENCOUNTER — Encounter
Admission: RE | Admit: 2015-11-17 | Discharge: 2015-11-17 | Disposition: A | Discharge status: Home / Self Care | Payer: Medicaid Other | Source: Ambulatory Visit | Attending: Psychiatry | Admitting: Psychiatry

## 2015-11-17 DIAGNOSIS — Z888 Allergy status to other drugs, medicaments and biological substances status: Secondary | ICD-10-CM | POA: Insufficient documentation

## 2015-11-17 DIAGNOSIS — M1990 Unspecified osteoarthritis, unspecified site: Secondary | ICD-10-CM | POA: Diagnosis not present

## 2015-11-17 DIAGNOSIS — Z9889 Other specified postprocedural states: Secondary | ICD-10-CM | POA: Diagnosis not present

## 2015-11-17 DIAGNOSIS — R413 Other amnesia: Secondary | ICD-10-CM | POA: Insufficient documentation

## 2015-11-17 DIAGNOSIS — F3181 Bipolar II disorder: Secondary | ICD-10-CM | POA: Diagnosis not present

## 2015-11-17 DIAGNOSIS — Z87442 Personal history of urinary calculi: Secondary | ICD-10-CM | POA: Insufficient documentation

## 2015-11-17 DIAGNOSIS — Z87891 Personal history of nicotine dependence: Secondary | ICD-10-CM | POA: Insufficient documentation

## 2015-11-17 DIAGNOSIS — R45851 Suicidal ideations: Secondary | ICD-10-CM | POA: Insufficient documentation

## 2015-11-17 DIAGNOSIS — F909 Attention-deficit hyperactivity disorder, unspecified type: Secondary | ICD-10-CM | POA: Diagnosis not present

## 2015-11-17 DIAGNOSIS — N189 Chronic kidney disease, unspecified: Secondary | ICD-10-CM | POA: Diagnosis not present

## 2015-11-17 MED ORDER — KETOROLAC TROMETHAMINE 30 MG/ML IJ SOLN
30.0000 mg | Freq: Once | INTRAMUSCULAR | Status: AC
  Administered 2015-11-17: 30 mg via INTRAVENOUS

## 2015-11-17 MED ORDER — SODIUM CHLORIDE 0.9 % IV SOLN
250.0000 mL | Freq: Once | INTRAVENOUS | Status: AC
  Administered 2015-11-17: 250 mL via INTRAVENOUS

## 2015-11-17 MED ORDER — SODIUM CHLORIDE 0.9 % IV SOLN
INTRAVENOUS | Status: DC | PRN
  Administered 2015-11-17: 11:00:00 via INTRAVENOUS

## 2015-11-17 MED ORDER — ONDANSETRON HCL 4 MG/2ML IJ SOLN
4.0000 mg | Freq: Once | INTRAMUSCULAR | Status: AC
  Administered 2015-11-17: 4 mg via INTRAVENOUS

## 2015-11-17 MED ORDER — METHOHEXITAL SODIUM 100 MG/10ML IV SOSY
PREFILLED_SYRINGE | INTRAVENOUS | Status: DC | PRN
  Administered 2015-11-17: 100 mg via INTRAVENOUS

## 2015-11-17 MED ORDER — MIDAZOLAM HCL 2 MG/2ML IJ SOLN
INTRAMUSCULAR | Status: DC | PRN
  Administered 2015-11-17: 2 mg via INTRAVENOUS

## 2015-11-17 MED ORDER — SUCCINYLCHOLINE CHLORIDE 20 MG/ML IJ SOLN
INTRAMUSCULAR | Status: DC | PRN
  Administered 2015-11-17: 120 mg via INTRAVENOUS

## 2015-11-17 NOTE — Transfer of Care (Signed)
Immediate Anesthesia Transfer of Care Note  Patient: Brittany [name]  Procedure(s) Performed: * No procedures listed *  Patient Location: PACU  Anesthesia Type:General  Level of Consciousness: sedated  Airway & Oxygen Therapy: Patient Spontanous Breathing and Patient connected to face mask oxygen  Post-op Assessment: Report given to RN and Post -op Vital signs reviewed and stable  Post vital signs: Reviewed and stable  Last Vitals:  Filed Vitals:   11/17/15 0905 11/17/15 1104  BP: 108/69 122/89  Pulse: 77 90  Temp: 35.9 C 36.8 C  Resp: 16 21    Complications: No apparent anesthesia complications

## 2015-11-17 NOTE — H&P (Signed)
Brittany [name] is an 30 y.o. female.   Chief Complaint: Patient reports some return of depression with passive suicidal thoughts without intent or plan. Affect looks upbeat and bright. A little bit of memory loss getting more noticeable. HPI: Patient with severe depression that has been refractory to treatment as part of likely bipolar disorder  Past Medical History  Diagnosis Date  . ADHD (attention deficit hyperactivity disorder)   . Bipolar disorder (HCC)   . Anxiety   . Depression   . Chronic kidney disease     Kidney stones  . Headache   . Arthritis     Past Surgical History  Procedure Laterality Date  . Foot surgery    . Tubal ligation    . Cesarean section    . Shoulder surgery Right   . Kidney surgery    . Novasure ablation  2010    Family History  Problem Relation Age of Onset  . Anxiety disorder Mother   . Depression Mother   . Drug abuse Mother   . Bipolar disorder Mother   . Depression Father   . Anxiety disorder Father   . Drug abuse Father   . Anxiety disorder Sister   . Depression Sister   . ADD / ADHD Sister   . Anxiety disorder Sister   . Depression Sister    Social History:  reports that she has quit smoking. Her smoking use included Cigarettes. She has never used smokeless tobacco. She reports that she drinks about 2.4 - 7.2 oz of alcohol per week. She reports that she uses illicit drugs (Marijuana).  Allergies:  Allergies  Allergen Reactions  . Ceclor [Cefaclor] Hives    "vomiting"     (Not in a hospital admission)  No results found for this or any previous visit (from the past 48 hour(s)). No results found.  Review of Systems  Constitutional: Negative.   HENT: Negative.   Eyes: Negative.   Respiratory: Negative.   Cardiovascular: Negative.   Gastrointestinal: Negative.   Musculoskeletal: Negative.   Skin: Negative.   Neurological: Negative.   Psychiatric/Behavioral: Positive for depression, suicidal ideas and memory loss.  Negative for hallucinations and substance abuse. The patient is not nervous/anxious and does not have insomnia.     Blood pressure 108/69, pulse 77, temperature 96.6 F (35.9 C), temperature source Oral, resp. rate 16, weight 113.399 kg (250 lb), SpO2 97 %. Physical Exam  Nursing note and vitals reviewed. Constitutional: She appears well-developed and well-nourished.  HENT:  Head: Normocephalic and atraumatic.  Eyes: Conjunctivae are normal. Pupils are equal, round, and reactive to light.  Neck: Normal range of motion.  Cardiovascular: Normal rate, regular rhythm and normal heart sounds.   Respiratory: Effort normal.  GI: Soft.  Musculoskeletal: Normal range of motion.  Neurological: She is alert.  Skin: Skin is warm and dry.  Psychiatric: Her speech is normal and behavior is normal. Judgment normal. Cognition and memory are normal. She exhibits a depressed mood. She expresses suicidal ideation. She expresses no suicidal plans.     Assessment/Plan Patient may be returning to what is essentially her baseline. Although she reports some suicidal ideation he has no intent or plan and her affect continues to appear euthymic. I don't think that there is any benefit especially with the memory loss and increasing the frequency of treatment. Patient needs to be seeing a therapist and go back to see her primary psychiatrist  Mordecai Rasmussen, MD 11/17/2015, 10:42 AM

## 2015-11-17 NOTE — Anesthesia Procedure Notes (Signed)
Date/Time: 11/17/2015 10:49 AM Performed by: Stormy Fabian Pre-anesthesia Checklist: Patient identified, Emergency Drugs available, Suction available and Patient being monitored Patient Re-evaluated:Patient Re-evaluated prior to inductionOxygen Delivery Method: Circle system utilized Preoxygenation: Pre-oxygenation with 100% oxygen Intubation Type: IV induction Ventilation: Mask ventilation without difficulty and Mask ventilation throughout procedure Airway Equipment and Method: Bite block Placement Confirmation: positive ETCO2 Dental Injury: Teeth and Oropharynx as per pre-operative assessment

## 2015-11-17 NOTE — Anesthesia Postprocedure Evaluation (Signed)
Anesthesia Post Note  Patient: Brittany [name]  Procedure(s) Performed: * No procedures listed *  Anesthesia Type: General Level of consciousness: awake Pain management: satisfactory to patient Vital Signs Assessment: post-procedure vital signs reviewed and stable Respiratory status: respiratory function stable Cardiovascular status: stable Anesthetic complications: no    Last Vitals:  Filed Vitals:   11/17/15 1146 11/17/15 1149  BP:  129/75  Pulse: 91   Temp:    Resp: 18     Last Pain:  Filed Vitals:   11/17/15 1150  PainSc: 0-No pain                 VAN STAVEREN,Salley Boxley

## 2015-11-17 NOTE — Procedures (Signed)
.  ECT SERVICES Physician's Interval Evaluation & Treatment Note  Patient Identification: Brittany [name] MRN:  [id number] Date of Evaluation:  11/17/2015 TX #: 7  MADRS: 22  MMSE: 30  P.E. Findings:  No change to physical exam vital signs stable  Psychiatric Interval Note:  Mood is a little bit more down some passive suicidal thoughts  Subjective:  Patient is a 30 y.o. female seen for evaluation for Electroconvulsive Therapy. Subjective feeling a little bit worse also having memory complaints  Treatment Summary:     Right Unilateral              Bilateral   % Energy : 0.3 ms 50%   Impedance: 1940 ohms  Seizure Energy Index: 7906 V squared  Postictal Suppression Index: 90%  Seizure Concordance Index: 98%  Medications  Pre Shock: Zofran 4 mg, Brevital 100 mg, succinylcholine 120 mg  Post Shock: Versed 2 mg  Seizure Duration: 32 seconds by EMG, 32 seconds by EEG   Comments: Follow-up in 2 weeks on February 20   Lungs:    Clear to auscultation                Other:   Heart:      Regular rhythm              irregular rhythm      Previous H&P reviewed, patient examined and there are NO CHANGES                   Previous H&P reviewed, patient examined and there are changes noted.   Mordecai Rasmussen, MD 2/6/201710:45 AM

## 2015-11-17 NOTE — Anesthesia Preprocedure Evaluation (Signed)
Anesthesia Evaluation  Patient identified by MRN, date of birth, ID band Patient awake    Reviewed: Allergy & Precautions, NPO status , Patient's Chart, lab work & pertinent test results  Airway Mallampati: II       Dental  (+) Teeth Intact   Pulmonary former smoker,    breath sounds clear to auscultation       Cardiovascular Exercise Tolerance: Good  Rhythm:Regular     Neuro/Psych  Headaches,    GI/Hepatic negative GI ROS, Neg liver ROS,   Endo/Other  Morbid obesity  Renal/GU      Musculoskeletal   Abdominal (+) + obese,   Peds negative pediatric ROS (+)  Hematology   Anesthesia Other Findings   Reproductive/Obstetrics                             Anesthesia Physical Anesthesia Plan  ASA: II  Anesthesia Plan: General   Post-op Pain Management:    Induction: Intravenous  Airway Management Planned: Mask  Additional Equipment:   Intra-op Plan:   Post-operative Plan:   Informed Consent: I have reviewed the patients History and Physical, chart, labs and discussed the procedure including the risks, benefits and alternatives for the proposed anesthesia with the patient or authorized representative who has indicated his/her understanding and acceptance.     Plan Discussed with: CRNA  Anesthesia Plan Comments:         Anesthesia Quick Evaluation

## 2015-12-01 ENCOUNTER — Encounter: Payer: Self-pay | Admitting: Certified Registered Nurse Anesthetist

## 2015-12-01 ENCOUNTER — Encounter (HOSPITAL_BASED_OUTPATIENT_CLINIC_OR_DEPARTMENT_OTHER)
Admission: RE | Admit: 2015-12-01 | Discharge: 2015-12-01 | Disposition: A | Discharge status: Home / Self Care | Payer: Medicaid Other | Source: Ambulatory Visit | Attending: Psychiatry | Admitting: Psychiatry

## 2015-12-01 DIAGNOSIS — F3181 Bipolar II disorder: Secondary | ICD-10-CM

## 2015-12-01 LAB — POCT PREGNANCY, URINE: PREG TEST UR: NEGATIVE

## 2015-12-01 MED ORDER — SODIUM CHLORIDE 0.9 % IV SOLN
INTRAVENOUS | Status: DC | PRN
Start: 2015-12-01 — End: 2015-12-01
  Administered 2015-12-01: 10:00:00 via INTRAVENOUS

## 2015-12-01 MED ORDER — KETOROLAC TROMETHAMINE 30 MG/ML IJ SOLN
30.0000 mg | Freq: Once | INTRAMUSCULAR | Status: AC
  Administered 2015-12-01: 30 mg via INTRAVENOUS

## 2015-12-01 MED ORDER — MIDAZOLAM HCL 2 MG/2ML IJ SOLN
INTRAMUSCULAR | Status: DC | PRN
  Administered 2015-12-01: 2 mg via INTRAVENOUS

## 2015-12-01 MED ORDER — METHOHEXITAL SODIUM 100 MG/10ML IV SOSY
PREFILLED_SYRINGE | INTRAVENOUS | Status: DC | PRN
  Administered 2015-12-01: 100 mg via INTRAVENOUS

## 2015-12-01 MED ORDER — SODIUM CHLORIDE 0.9 % IV SOLN
250.0000 mL | Freq: Once | INTRAVENOUS | Status: AC
  Administered 2015-12-01: 500 mL via INTRAVENOUS

## 2015-12-01 MED ORDER — MIDAZOLAM HCL 2 MG/2ML IJ SOLN: 2.0000 mg | Freq: Once | INTRAMUSCULAR | Status: DC

## 2015-12-01 MED ORDER — ONDANSETRON HCL 4 MG/2ML IJ SOLN
4.0000 mg | Freq: Once | INTRAMUSCULAR | Status: AC
  Administered 2015-12-01: 4 mg via INTRAVENOUS

## 2015-12-01 MED ORDER — SUCCINYLCHOLINE CHLORIDE 20 MG/ML IJ SOLN
INTRAMUSCULAR | Status: DC | PRN
  Administered 2015-12-01: 120 mg via INTRAVENOUS

## 2015-12-01 NOTE — Anesthesia Preprocedure Evaluation (Signed)
Anesthesia Evaluation  Patient identified by MRN, date of birth, ID band Patient awake    Reviewed: Allergy & Precautions, NPO status , Patient's Chart, lab work & pertinent test results  Airway Mallampati: II       Dental  (+) Teeth Intact   Pulmonary former smoker,    breath sounds clear to auscultation       Cardiovascular Exercise Tolerance: Good  Rhythm:Regular     Neuro/Psych  Headaches,    GI/Hepatic negative GI ROS, Neg liver ROS,   Endo/Other  Morbid obesity  Renal/GU      Musculoskeletal   Abdominal (+) + obese,   Peds negative pediatric ROS (+)  Hematology   Anesthesia Other Findings   Reproductive/Obstetrics                             Anesthesia Physical  Anesthesia Plan  ASA: II  Anesthesia Plan: General   Post-op Pain Management:    Induction: Intravenous  Airway Management Planned: Mask  Additional Equipment:   Intra-op Plan:   Post-operative Plan:   Informed Consent: I have reviewed the patients History and Physical, chart, labs and discussed the procedure including the risks, benefits and alternatives for the proposed anesthesia with the patient or authorized representative who has indicated his/her understanding and acceptance.     Plan Discussed with: CRNA  Anesthesia Plan Comments:         Anesthesia Quick Evaluation

## 2015-12-01 NOTE — Anesthesia Procedure Notes (Signed)
Performed by: Yuriel Lopezmartinez Pre-anesthesia Checklist: Patient identified, Patient being monitored, Timeout performed, Emergency Drugs available and Suction available Patient Re-evaluated:Patient Re-evaluated prior to inductionOxygen Delivery Method: Circle system utilized Preoxygenation: Pre-oxygenation with 100% oxygen Ventilation: Mask ventilation without difficulty Airway Equipment and Method: Bite block Dental Injury: Teeth and Oropharynx as per pre-operative assessment        

## 2015-12-01 NOTE — H&P (Signed)
Brittany [name] is an 30 y.o. female.   Chief Complaint: Her mood has been up and down. Had a recent episode of cutting. Feeling very anxious today. No new physical complaints HPI: Patient with recurrent depression and possible bipolar disorder receiving maintenance ECT  Past Medical History  Diagnosis Date  . ADHD (attention deficit hyperactivity disorder)   . Bipolar disorder (HCC)   . Anxiety   . Depression   . Chronic kidney disease     Kidney stones  . Headache   . Arthritis     Past Surgical History  Procedure Laterality Date  . Foot surgery    . Tubal ligation    . Cesarean section    . Shoulder surgery Right   . Kidney surgery    . Novasure ablation  2010    Family History  Problem Relation Age of Onset  . Anxiety disorder Mother   . Depression Mother   . Drug abuse Mother   . Bipolar disorder Mother   . Depression Father   . Anxiety disorder Father   . Drug abuse Father   . Anxiety disorder Sister   . Depression Sister   . ADD / ADHD Sister   . Anxiety disorder Sister   . Depression Sister    Social History:  reports that she has quit smoking. Her smoking use included Cigarettes. She has never used smokeless tobacco. She reports that she drinks about 2.4 - 7.2 oz of alcohol per week. She reports that she uses illicit drugs (Marijuana).  Allergies:  Allergies  Allergen Reactions  . Ceclor [Cefaclor] Hives    "vomiting"     (Not in a hospital admission)  No results found for this or any previous visit (from the past 48 hour(s)). No results found.  Review of Systems  Constitutional: Negative.   HENT: Negative.   Eyes: Negative.   Respiratory: Negative.   Cardiovascular: Negative.   Gastrointestinal: Negative.   Musculoskeletal: Negative.   Skin: Negative.   Neurological: Negative.   Psychiatric/Behavioral: Positive for depression. Negative for suicidal ideas, hallucinations, memory loss and substance abuse. The patient is nervous/anxious. The  patient does not have insomnia.     Blood pressure 136/85, temperature 96.3 F (35.7 C), temperature source Oral, resp. rate 18, height  (1.626 m), weight 110.678 kg (244 lb), SpO2 99 %. Physical Exam  Nursing note and vitals reviewed. Constitutional: She appears well-developed and well-nourished.  HENT:  Head: Normocephalic and atraumatic.  Eyes: Conjunctivae are normal. Pupils are equal, round, and reactive to light.  Neck: Normal range of motion.  Cardiovascular: Normal rate, regular rhythm and normal heart sounds.   Respiratory: Effort normal and breath sounds normal. No respiratory distress.  GI: Soft.  Musculoskeletal: Normal range of motion.  Neurological: She is alert.  Skin: Skin is warm and dry.  Psychiatric: She has a normal mood and affect. Her behavior is normal. Judgment and thought content normal.     Assessment/Plan Treatment today follow-up 2 weeks  Mordecai Rasmussen, MD 12/01/2015, 10:16 AM

## 2015-12-01 NOTE — Procedures (Signed)
ECT SERVICES Physician's Interval Evaluation & Treatment Note  Patient Identification: Brittany [name] MRN:  [id number] Date of Evaluation:  12/01/2015 TX #: 8  MADRS:   MMSE:   P.E. Findings:  No change to physical exam heart sounds normal lungs clear. Blood pressure was elevated prior to treatment today.  Psychiatric Interval Note:  Mood has been up and down. She had some recent cutting. Still goes through spells of getting very depressed and anxious.  Subjective:  Patient is a 30 y.o. female seen for evaluation for Electroconvulsive Therapy. Notes that her mood is been down and more nervous.  Treatment Summary:     Right Unilateral              Bilateral   % Energy : 0.3 ms 50%   Impedance: 1460 ohms  Seizure Energy Index: 13,047 V squared and  Postictal Suppression Index: 83%  Seizure Concordance Index: 98%  Medications  Pre Shock: Zofran 4 mg, Toradol 30 mg, Brevital 100 mg, succinylcholine 120 mg  Post Shock: Versed 2 mg  Seizure Duration: 28 seconds by EMG, 46 seconds by EEG   Comments: Patient will follow-up with next maintenance ECT treatment March 6. She is to go back and see her primary psychiatric provider in the meantime.   Lungs:    Clear to auscultation                Other:   Heart:      Regular rhythm              irregular rhythm      Previous H&P reviewed, patient examined and there are NO CHANGES                   Previous H&P reviewed, patient examined and there are changes noted.   Mordecai Rasmussen, MD 2/20/201710:18 AM

## 2015-12-01 NOTE — Transfer of Care (Signed)
Immediate Anesthesia Transfer of Care Note  Patient: Brittany [name]  Procedure(s) Performed: * No procedures listed *  Patient Location: PACU  Anesthesia Type:General  Level of Consciousness: awake and alert   Airway & Oxygen Therapy: Patient Spontanous Breathing and Patient connected to face mask oxygen  Post-op Assessment: Report given to RN and Post -op Vital signs reviewed and stable  Post vital signs: Reviewed and stable  Last Vitals:  Filed Vitals:   12/01/15 0829  BP: 136/85  Temp: 35.7 C  Resp: 18    Complications: No apparent anesthesia complications

## 2015-12-08 NOTE — Addendum Note (Signed)
Addendum  created 12/08/15 1123 by Berdine Addison, MD   Modules edited: Clinical Notes   Clinical Notes:  File: [id number]

## 2015-12-08 NOTE — Anesthesia Postprocedure Evaluation (Signed)
Anesthesia Post Note  Patient: Aleshka Corney  Procedure(s) Performed: * No procedures listed *  Patient location during evaluation: PACU Anesthesia Type: General Level of consciousness: awake and alert Pain management: pain level controlled Vital Signs Assessment: post-procedure vital signs reviewed and stable Respiratory status: spontaneous breathing, nonlabored ventilation, respiratory function stable and patient connected to nasal cannula oxygen Cardiovascular status: blood pressure returned to baseline and stable Postop Assessment: no signs of nausea or vomiting Anesthetic complications: no    Last Vitals:  Filed Vitals:   12/01/15 1110 12/01/15 1115  BP:  102/70  Pulse: 92   Temp:    Resp:      Last Pain: There were no vitals filed for this visit.               Dennis Killilea S

## 2015-12-15 ENCOUNTER — Encounter: Payer: Self-pay | Admitting: Anesthesiology

## 2015-12-15 ENCOUNTER — Encounter
Admission: RE | Admit: 2015-12-15 | Discharge: 2015-12-15 | Disposition: A | Discharge status: Home / Self Care | Payer: Medicaid Other | Source: Ambulatory Visit | Attending: Psychiatry | Admitting: Psychiatry

## 2015-12-15 DIAGNOSIS — F909 Attention-deficit hyperactivity disorder, unspecified type: Secondary | ICD-10-CM | POA: Diagnosis not present

## 2015-12-15 DIAGNOSIS — F329 Major depressive disorder, single episode, unspecified: Secondary | ICD-10-CM | POA: Insufficient documentation

## 2015-12-15 DIAGNOSIS — F3181 Bipolar II disorder: Secondary | ICD-10-CM | POA: Diagnosis present

## 2015-12-15 DIAGNOSIS — Z87891 Personal history of nicotine dependence: Secondary | ICD-10-CM | POA: Insufficient documentation

## 2015-12-15 DIAGNOSIS — N189 Chronic kidney disease, unspecified: Secondary | ICD-10-CM | POA: Diagnosis not present

## 2015-12-15 LAB — POCT PREGNANCY, URINE: Preg Test, Ur: NEGATIVE

## 2015-12-15 MED ORDER — SUCCINYLCHOLINE CHLORIDE 20 MG/ML IJ SOLN
INTRAMUSCULAR | Status: DC | PRN
  Administered 2015-12-15: 120 mg via INTRAVENOUS

## 2015-12-15 MED ORDER — ONDANSETRON HCL 4 MG/2ML IJ SOLN: 4.0000 mg | Freq: Once | INTRAMUSCULAR | Status: DC | PRN

## 2015-12-15 MED ORDER — FENTANYL CITRATE (PF) 100 MCG/2ML IJ SOLN: 25.0000 ug | INTRAMUSCULAR | Status: DC | PRN

## 2015-12-15 MED ORDER — KETOROLAC TROMETHAMINE 30 MG/ML IJ SOLN
30.0000 mg | Freq: Once | INTRAMUSCULAR | Status: AC
  Administered 2015-12-15: 30 mg via INTRAVENOUS

## 2015-12-15 MED ORDER — MIDAZOLAM HCL 2 MG/2ML IJ SOLN
INTRAMUSCULAR | Status: DC | PRN
  Administered 2015-12-15: 2 mg via INTRAVENOUS

## 2015-12-15 MED ORDER — SODIUM CHLORIDE 0.9 % IV SOLN
250.0000 mL | Freq: Once | INTRAVENOUS | Status: AC
  Administered 2015-12-15: 10:00:00 via INTRAVENOUS

## 2015-12-15 MED ORDER — METHOHEXITAL SODIUM 100 MG/10ML IV SOSY
PREFILLED_SYRINGE | INTRAVENOUS | Status: DC | PRN
  Administered 2015-12-15: 100 mg via INTRAVENOUS

## 2015-12-15 MED ORDER — ONDANSETRON HCL 4 MG/2ML IJ SOLN
4.0000 mg | Freq: Once | INTRAMUSCULAR | Status: AC
  Administered 2015-12-15: 4 mg via INTRAVENOUS

## 2015-12-15 MED ORDER — SODIUM CHLORIDE 0.9 % IV SOLN
INTRAVENOUS | Status: DC | PRN
  Administered 2015-12-15: 11:00:00 via INTRAVENOUS

## 2015-12-15 NOTE — H&P (Signed)
Brittany [name] is an 30 y.o. female.   Chief Complaint: Patient has no new complaints today. States that her mood is feeling good. Has had a couple of what she calls "off days" and this week is a little bit down because it's the anniversary of the death of a friend of hers but is not having suicidality or mania or psychosis. HPI: Patient with a history of major depression. Recurrent and severe. Responded well to ECT. Currently good social support good medical stability. No new complaints.  Past Medical History  Diagnosis Date  . ADHD (attention deficit hyperactivity disorder)   . Bipolar disorder (HCC)   . Anxiety   . Depression   . Chronic kidney disease     Kidney stones  . Headache   . Arthritis     Past Surgical History  Procedure Laterality Date  . Foot surgery    . Tubal ligation    . Cesarean section    . Shoulder surgery Right   . Kidney surgery    . Novasure ablation  2010    Family History  Problem Relation Age of Onset  . Anxiety disorder Mother   . Depression Mother   . Drug abuse Mother   . Bipolar disorder Mother   . Depression Father   . Anxiety disorder Father   . Drug abuse Father   . Anxiety disorder Sister   . Depression Sister   . ADD / ADHD Sister   . Anxiety disorder Sister   . Depression Sister    Social History:  reports that she has quit smoking. Her smoking use included Cigarettes. She has never used smokeless tobacco. She reports that she drinks about 2.4 - 7.2 oz of alcohol per week. She reports that she uses illicit drugs (Marijuana).  Allergies:  Allergies  Allergen Reactions  . Ceclor [Cefaclor] Hives    "vomiting"     (Not in a hospital admission)  Results for orders placed or performed during the hospital encounter of 12/15/15 (from the past 48 hour(s))  Pregnancy, urine POC     Status: None   Collection Time: 12/15/15  9:09 AM  Result Value Ref Range   Preg Test, Ur NEGATIVE NEGATIVE    Comment:        THE SENSITIVITY OF  THIS METHODOLOGY IS >24 mIU/mL    No results found.  Review of Systems  Constitutional: Negative.   HENT: Negative.   Eyes: Negative.   Respiratory: Negative.   Cardiovascular: Negative.   Gastrointestinal: Negative.   Musculoskeletal: Negative.   Skin: Negative.   Neurological: Negative.   Psychiatric/Behavioral: Negative for depression, suicidal ideas, hallucinations, memory loss and substance abuse. The patient is not nervous/anxious and does not have insomnia.     Blood pressure 117/81, pulse 89, resp. rate 16, height  (1.626 m), weight 110.678 kg (244 lb), SpO2 99 %. Physical Exam  Nursing note and vitals reviewed. Constitutional: She appears well-developed and well-nourished.  HENT:  Head: Normocephalic and atraumatic.  Eyes: Conjunctivae are normal. Pupils are equal, round, and reactive to light.  Neck: Normal range of motion.  Cardiovascular: Normal rate, regular rhythm and normal heart sounds.   Respiratory: Effort normal and breath sounds normal. No respiratory distress.  GI: Soft.  Musculoskeletal: Normal range of motion.  Neurological: She is alert.  Skin: Skin is warm and dry.  Psychiatric: She has a normal mood and affect. Her behavior is normal. Judgment and thought content normal.     Assessment/Plan Plan  is for ECT today as part of a maintenance regimen. We will follow-up in another 2-3 weeks. See procedure note for details. Patient agreeable to the plan.  Mordecai RasmussenJohn Clapacs, MD 12/15/2015, 10:09 AM

## 2015-12-15 NOTE — Anesthesia Preprocedure Evaluation (Signed)
Anesthesia Evaluation  Patient identified by MRN, date of birth, ID band Patient awake    Reviewed: Allergy & Precautions, NPO status , Patient's Chart, lab work & pertinent test results  History of Anesthesia Complications Negative for: history of anesthetic complications  Airway Mallampati: II       Dental  (+) Teeth Intact   Pulmonary former smoker,    breath sounds clear to auscultation       Cardiovascular Exercise Tolerance: Good negative cardio ROS   Rhythm:Regular     Neuro/Psych  Headaches, PSYCHIATRIC DISORDERS (Bipolar, ADHD)    GI/Hepatic negative GI ROS, Neg liver ROS,   Endo/Other  Morbid obesity  Renal/GU Renal disease (kidney stones)     Musculoskeletal   Abdominal (+) + obese,   Peds negative pediatric ROS (+)  Hematology   Anesthesia Other Findings   Reproductive/Obstetrics negative OB ROS                             Anesthesia Physical  Anesthesia Plan  ASA: III  Anesthesia Plan: General   Post-op Pain Management:    Induction: Intravenous  Airway Management Planned: Mask  Additional Equipment:   Intra-op Plan:   Post-operative Plan:   Informed Consent: I have reviewed the patients History and Physical, chart, labs and discussed the procedure including the risks, benefits and alternatives for the proposed anesthesia with the patient or authorized representative who has indicated his/her understanding and acceptance.     Plan Discussed with: CRNA, Anesthesiologist and Surgeon  Anesthesia Plan Comments:         Anesthesia Quick Evaluation

## 2015-12-15 NOTE — Transfer of Care (Signed)
Immediate Anesthesia Transfer of Care Note  Patient: Brittany [name]  Procedure(s) Performed: ECT Patient Location: PACU  Anesthesia Type:General  Level of Consciousness: awake  Airway & Oxygen Therapy: Patient Spontanous Breathing  Post-op Assessment: Report given to RN and Post -op Vital signs reviewed and stable  Post vital signs: stable  Last Vitals:  Filed Vitals:   12/15/15 1110 12/15/15 1113  BP:  103/66  Pulse: 87 85  Temp: 36.6 C 36.6 C  Resp: 17 16    Complications: No apparent anesthesia complications

## 2015-12-15 NOTE — Procedures (Signed)
ECT SERVICES Physician's Interval Evaluation & Treatment Note  Patient Identification: Brittany [name] MRN:  [id number] Date of Evaluation:  12/15/2015 TX #:  9  MADRS:   MMSE:   P.E. Findings:  No change to physical exam. Vital signs stable. Heart normal lungs clear.  Psychiatric Interval Note:  Mood is feeling good area no mania and no major depression no psychosis no suicidal thoughts  Subjective:  Patient is a 30 y.o. female seen for evaluation for Electroconvulsive Therapy. No specific new complaints  Treatment Summary:   [x]   Right Unilateral             []  Bilateral   % Energy : 0.3 ms 50%   Impedance: 1710 ohms  Seizure Energy Index: 5780 V squared  Postictal Suppression Index: 71%  Seizure Concordance Index: 95%  Medications  Pre Shock: Zofran 4 mg, Toradol 30 mg, Brevital 100 mg, succinylcholine 120 mg  Post Shock: Versed 2 mg  Seizure Duration: 41 seconds by EMG, 47 seconds by EEG   Comments: Follow-up in 2 weeks on March 20   Lungs:  [x]   Clear to auscultation               []  Other:   Heart:    [x]   Regular rhythm             []  irregular rhythm    [x]   Previous H&P reviewed, patient examined and there are NO CHANGES                 []   Previous H&P reviewed, patient examined and there are changes noted.   Mordecai RasmussenJohn Clapacs, MD 3/6/201710:51 AM

## 2015-12-16 NOTE — Anesthesia Postprocedure Evaluation (Signed)
Anesthesia Post Note  Patient: Brittany [name]  Procedure(s) Performed: * No procedures listed *  Patient location during evaluation: PACU Anesthesia Type: General Level of consciousness: awake and alert Pain management: pain level controlled Vital Signs Assessment: post-procedure vital signs reviewed and stable Respiratory status: spontaneous breathing, nonlabored ventilation, respiratory function stable and patient connected to nasal cannula oxygen Cardiovascular status: blood pressure returned to baseline and stable Postop Assessment: no signs of nausea or vomiting Anesthetic complications: no    Last Vitals:  Filed Vitals:   12/15/15 1140 12/15/15 1150  BP:  136/68  Pulse:  94  Temp:    Resp: 15 16    Last Pain: There were no vitals filed for this visit.               Lenard SimmerAndrew Lehman Whiteley

## 2015-12-31 ENCOUNTER — Encounter: Admission: RE | Admit: 2015-12-31 | Payer: Medicaid Other | Source: Ambulatory Visit

## 2016-01-12 ENCOUNTER — Telehealth: Payer: Self-pay

## 2016-01-13 ENCOUNTER — Other Ambulatory Visit: Payer: Self-pay | Admitting: Psychiatry

## 2016-01-14 ENCOUNTER — Encounter
Admission: RE | Admit: 2016-01-14 | Discharge: 2016-01-14 | Disposition: A | Discharge status: Home / Self Care | Payer: Medicaid Other | Source: Ambulatory Visit | Attending: Psychiatry | Admitting: Psychiatry

## 2016-01-14 ENCOUNTER — Encounter: Payer: Self-pay | Admitting: Certified Registered"

## 2016-01-14 DIAGNOSIS — Z818 Family history of other mental and behavioral disorders: Secondary | ICD-10-CM | POA: Insufficient documentation

## 2016-01-14 DIAGNOSIS — F909 Attention-deficit hyperactivity disorder, unspecified type: Secondary | ICD-10-CM | POA: Insufficient documentation

## 2016-01-14 DIAGNOSIS — R51 Headache: Secondary | ICD-10-CM | POA: Diagnosis not present

## 2016-01-14 DIAGNOSIS — Z9889 Other specified postprocedural states: Secondary | ICD-10-CM | POA: Insufficient documentation

## 2016-01-14 DIAGNOSIS — Z888 Allergy status to other drugs, medicaments and biological substances status: Secondary | ICD-10-CM | POA: Diagnosis not present

## 2016-01-14 DIAGNOSIS — Z813 Family history of other psychoactive substance abuse and dependence: Secondary | ICD-10-CM | POA: Insufficient documentation

## 2016-01-14 DIAGNOSIS — F419 Anxiety disorder, unspecified: Secondary | ICD-10-CM | POA: Insufficient documentation

## 2016-01-14 DIAGNOSIS — M1990 Unspecified osteoarthritis, unspecified site: Secondary | ICD-10-CM | POA: Diagnosis not present

## 2016-01-14 DIAGNOSIS — F332 Major depressive disorder, recurrent severe without psychotic features: Secondary | ICD-10-CM | POA: Diagnosis not present

## 2016-01-14 DIAGNOSIS — F319 Bipolar disorder, unspecified: Secondary | ICD-10-CM | POA: Diagnosis not present

## 2016-01-14 DIAGNOSIS — Z3202 Encounter for pregnancy test, result negative: Secondary | ICD-10-CM | POA: Diagnosis not present

## 2016-01-14 DIAGNOSIS — Z87891 Personal history of nicotine dependence: Secondary | ICD-10-CM | POA: Diagnosis not present

## 2016-01-14 DIAGNOSIS — N189 Chronic kidney disease, unspecified: Secondary | ICD-10-CM | POA: Diagnosis not present

## 2016-01-14 DIAGNOSIS — Z87442 Personal history of urinary calculi: Secondary | ICD-10-CM | POA: Insufficient documentation

## 2016-01-14 LAB — POCT PREGNANCY, URINE: Preg Test, Ur: NEGATIVE

## 2016-01-14 MED ORDER — SUCCINYLCHOLINE CHLORIDE 20 MG/ML IJ SOLN
INTRAMUSCULAR | Status: DC | PRN
  Administered 2016-01-14: 120 mg via INTRAVENOUS

## 2016-01-14 MED ORDER — KETOROLAC TROMETHAMINE 30 MG/ML IJ SOLN
INTRAMUSCULAR | Status: AC
  Filled 2016-01-14: qty 1

## 2016-01-14 MED ORDER — ONDANSETRON HCL 4 MG/2ML IJ SOLN
INTRAMUSCULAR | Status: AC
  Administered 2016-01-14: 4 mg via INTRAVENOUS
  Filled 2016-01-14: qty 2

## 2016-01-14 MED ORDER — ONDANSETRON HCL 4 MG/2ML IJ SOLN
4.0000 mg | Freq: Once | INTRAMUSCULAR | Status: AC
  Administered 2016-01-14: 4 mg via INTRAVENOUS

## 2016-01-14 MED ORDER — METHOHEXITAL SODIUM 100 MG/10ML IV SOSY
PREFILLED_SYRINGE | INTRAVENOUS | Status: DC | PRN
  Administered 2016-01-14: 100 mg via INTRAVENOUS

## 2016-01-14 MED ORDER — SODIUM CHLORIDE 0.9 % IV SOLN
250.0000 mL | Freq: Once | INTRAVENOUS | Status: AC
  Administered 2016-01-14: 500 mL via INTRAVENOUS

## 2016-01-14 MED ORDER — MIDAZOLAM HCL 2 MG/2ML IJ SOLN
2.0000 mg | Freq: Once | INTRAMUSCULAR | Status: AC
  Administered 2016-01-14: 2 mg via INTRAVENOUS

## 2016-01-14 MED ORDER — KETOROLAC TROMETHAMINE 30 MG/ML IJ SOLN
30.0000 mg | Freq: Once | INTRAMUSCULAR | Status: AC
  Administered 2016-01-14: 11:00:00 via INTRAVENOUS

## 2016-01-14 MED ORDER — SODIUM CHLORIDE 0.9 % IV SOLN
INTRAVENOUS | Status: DC | PRN
  Administered 2016-01-14: 12:00:00 via INTRAVENOUS

## 2016-01-14 NOTE — Transfer of Care (Signed)
Immediate Anesthesia Transfer of Care Note  Patient: Brittany [name]  Procedure(s) Performed: * No procedures listed *  Patient Location: PACU  Anesthesia Type:General  Level of Consciousness: awake, alert , oriented and patient cooperative  Airway & Oxygen Therapy: Patient Spontanous Breathing and Patient connected to face mask oxygen  Post-op Assessment: Report given to RN, Post -op Vital signs reviewed and stable and Patient moving all extremities X 4  Post vital signs: Reviewed and stable  Last Vitals:  Filed Vitals:   01/14/16 0853  BP: 114/80  Pulse: 72  Temp: 35.9 C  Resp: 16    Complications: No apparent anesthesia complications

## 2016-01-14 NOTE — H&P (Signed)
Brittany [name] is an 30 y.o. female.   Chief Complaint: Patient reports that she has been more irritable recently which she attributes to having not come frequently enough for ECT treatments. Not feeling particularly sad. No suicidal ideation. HPI: Patient is suffering from chronic bipolar disorder rapid cycling with depression and irritability most prominent  Past Medical History  Diagnosis Date  . ADHD (attention deficit hyperactivity disorder)   . Bipolar disorder (HCC)   . Anxiety   . Depression   . Chronic kidney disease     Kidney stones  . Headache   . Arthritis     Past Surgical History  Procedure Laterality Date  . Foot surgery    . Tubal ligation    . Cesarean section    . Shoulder surgery Right   . Kidney surgery    . Novasure ablation  2010    Family History  Problem Relation Age of Onset  . Anxiety disorder Mother   . Depression Mother   . Drug abuse Mother   . Bipolar disorder Mother   . Depression Father   . Anxiety disorder Father   . Drug abuse Father   . Anxiety disorder Sister   . Depression Sister   . ADD / ADHD Sister   . Anxiety disorder Sister   . Depression Sister    Social History:  reports that she has quit smoking. Her smoking use included Cigarettes. She has never used smokeless tobacco. She reports that she drinks about 2.4 - 7.2 oz of alcohol per week. She reports that she uses illicit drugs (Marijuana).  Allergies:  Allergies  Allergen Reactions  . Ceclor [Cefaclor] Hives    "vomiting"     (Not in a hospital admission)  Results for orders placed or performed during the hospital encounter of 01/14/16 (from the past 48 hour(s))  Pregnancy, urine POC     Status: None   Collection Time: 01/14/16  8:42 AM  Result Value Ref Range   Preg Test, Ur NEGATIVE NEGATIVE    Comment:        THE SENSITIVITY OF THIS METHODOLOGY IS >24 mIU/mL    No results found.  Review of Systems  Constitutional: Negative.   HENT: Negative.   Eyes:  Negative.   Respiratory: Negative.   Cardiovascular: Negative.   Gastrointestinal: Negative.   Musculoskeletal: Negative.   Skin: Negative.   Neurological: Negative.   Psychiatric/Behavioral: Positive for depression. Negative for suicidal ideas, hallucinations, memory loss and substance abuse. The patient is nervous/anxious. The patient does not have insomnia.     Blood pressure 114/80, pulse 72, temperature 96.7 F (35.9 C), temperature source Tympanic, resp. rate 16, height 5\' 4"  (1.626 m), weight 113.399 kg (250 lb), SpO2 99 %. Physical Exam  Nursing note and vitals reviewed. Constitutional: She appears well-developed and well-nourished.  HENT:  Head: Normocephalic and atraumatic.  Eyes: Conjunctivae are normal. Pupils are equal, round, and reactive to light.  Neck: Normal range of motion.  Cardiovascular: Normal rate, regular rhythm and normal heart sounds.   Respiratory: Effort normal and breath sounds normal. No respiratory distress.  GI: Soft.  Musculoskeletal: Normal range of motion.  Neurological: She is alert.  Skin: Skin is warm and dry.  Psychiatric: Her speech is normal and behavior is normal. Judgment and thought content normal. Her mood appears anxious. Cognition and memory are normal. She exhibits a depressed mood.     Assessment/Plan Treatment today in follow-up in 2 weeks. Patient has been instructed to please  getting contact with a psychiatric provider as an outpatient so that somebody trained can be prescribing her medicine.  Mordecai Rasmussen, MD 01/14/2016, 11:55 AM

## 2016-01-14 NOTE — Procedures (Signed)
ECT SERVICES Physician's Interval Evaluation & Treatment Note  Patient Identification: Brittany [name] MRN:  [id number] Date of Evaluation:  01/14/2016 TX #: 9  MADRS:   MMSE:   P.E. Findings:  No change to physical exam  Psychiatric Interval Note:  Mood has been feeling more irritable  Subjective:  Patient is a 30 y.o. female seen for evaluation for Electroconvulsive Therapy. Irritability is her chief complaint  Treatment Summary:   [x]   Right Unilateral             []  Bilateral   % Energy : 0.3 ms 50%   Impedance: 1140 ohms  Seizure Energy Index: 6466 V squared  Postictal Suppression Index: 96%  Seizure Concordance Index: 98%  Medications  Pre Shock: Zofran 4 mg, Toradol 30 mg, Brevital 100 mg, succinylcholine 120 mg  Post Shock: Versed 2 mg  Seizure Duration: 20 seconds by EMG, 63 seconds by EEG   Comments: Follow-up scheduled for 2 weeks   Lungs:  [x]   Clear to auscultation               []  Other:   Heart:    [x]   Regular rhythm             []  irregular rhythm    [x]   Previous H&P reviewed, patient examined and there are NO CHANGES                 []   Previous H&P reviewed, patient examined and there are changes noted.   Mordecai RasmussenJohn Liane Tribbey, MD 4/5/201711:57 AM

## 2016-01-14 NOTE — Anesthesia Preprocedure Evaluation (Signed)
Anesthesia Evaluation  Patient identified by MRN, date of birth, ID band Patient awake    Reviewed: Allergy & Precautions, NPO status , Patient's Chart, lab work & pertinent test results  History of Anesthesia Complications Negative for: history of anesthetic complications  Airway Mallampati: II       Dental  (+) Teeth Intact   Pulmonary former smoker,    breath sounds clear to auscultation       Cardiovascular Exercise Tolerance: Good negative cardio ROS   Rhythm:Regular     Neuro/Psych  Headaches, PSYCHIATRIC DISORDERS (Bipolar, ADHD)    GI/Hepatic negative GI ROS, Neg liver ROS,   Endo/Other  Morbid obesity  Renal/GU Renal disease (kidney stones)     Musculoskeletal   Abdominal (+) + obese,   Peds negative pediatric ROS (+)  Hematology   Anesthesia Other Findings Past Medical History:   ADHD (attention deficit hyperactivity disorder)              Bipolar disorder (HCC)                                       Anxiety                                                      Depression                                                   Chronic kidney disease                                         Comment:Kidney stones   Headache                                                     Arthritis                                                    Reproductive/Obstetrics negative OB ROS                             Anesthesia Physical  Anesthesia Plan  ASA: III  Anesthesia Plan: General   Post-op Pain Management:    Induction: Intravenous  Airway Management Planned: Mask  Additional Equipment:   Intra-op Plan:   Post-operative Plan:   Informed Consent: I have reviewed the patients History and Physical, chart, labs and discussed the procedure including the risks, benefits and alternatives for the proposed anesthesia with the patient or authorized representative who has indicated  his/her understanding and acceptance.     Plan Discussed with: CRNA, Anesthesiologist and Surgeon  Anesthesia Plan Comments:  Anesthesia Quick Evaluation  

## 2016-01-14 NOTE — Discharge Instructions (Addendum)
1)  The drugs that you have been given will stay in your system until tomorrow so for the       next 24 hours you should not:  A. Drive an automobile  B. Make any legal decisions  C. Drink any alcoholic beverages  2)  You may resume your regular meals upon return home.  3)  A responsible adult must take you home.  Someone should stay with you for a few          hours, then be available by phone for the remainder of the treatment day.  4)  You May experience any of the following symptoms:  Headache, Nausea and a dry mouth (due to the medications you were given),  temporary memory loss and some confusion, or sore muscles (a warm bath  should help this).  If you you experience any of these symptoms let us know on                your return visit.  5)  Report any of the following: any acute discomfort, severe headache, or temperature        greater than 100.5 F.   Also report any unusual redness, swelling, drainage, or pain         at your IV site.    You may report Symptoms to:  ECT [facility]- [place] [facility]          Phone: [phone], ECT Department           or Dr. Shary Keylapac's office [phone]  6)  Your next ECT Treatment is Wednesday January 28, 2016  We will call 2 days prior to your scheduled appointment for arrival times.  7)  Nothing to eat or drink after midnight the night before your procedure.  8)  Take      With a sip of water the morning of your procedure.  9)  Other Instructions: Call [phone] to cancel the morning of your procedure due         to illness or emergency.  10) We will call within 72 hours to assess how you are feeling.

## 2016-01-15 NOTE — Anesthesia Postprocedure Evaluation (Signed)
Anesthesia Post Note  Patient: Brittany [name]  Procedure(s) Performed: * No procedures listed *  Patient location during evaluation: PACU Anesthesia Type: General Level of consciousness: awake and alert Pain management: pain level controlled Vital Signs Assessment: post-procedure vital signs reviewed and stable Respiratory status: spontaneous breathing, nonlabored ventilation, respiratory function stable and patient connected to nasal cannula oxygen Cardiovascular status: blood pressure returned to baseline and stable Postop Assessment: no signs of nausea or vomiting Anesthetic complications: no    Last Vitals:  Filed Vitals:   01/14/16 1243 01/14/16 1256  BP: 114/77 122/76  Pulse: 94 92  Temp: 36.7 C   Resp: 21 18    Last Pain:  Filed Vitals:   01/14/16 1258  PainSc: 0-No pain                 Lenard SimmerAndrew Swannie Milius

## 2016-01-28 ENCOUNTER — Other Ambulatory Visit: Payer: Self-pay | Admitting: Psychiatry

## 2016-01-28 ENCOUNTER — Encounter: Admission: RE | Admit: 2016-01-28 | Payer: Medicaid Other | Source: Ambulatory Visit

## 2016-02-11 ENCOUNTER — Telehealth: Payer: Self-pay | Admitting: *Deleted

## 2016-02-16 ENCOUNTER — Encounter: Payer: Self-pay | Admitting: Certified Registered Nurse Anesthetist

## 2016-02-16 ENCOUNTER — Encounter
Admission: RE | Admit: 2016-02-16 | Discharge: 2016-02-16 | Disposition: A | Discharge status: Home / Self Care | Payer: Medicaid Other | Source: Ambulatory Visit | Attending: Psychiatry | Admitting: Psychiatry

## 2016-02-16 DIAGNOSIS — F332 Major depressive disorder, recurrent severe without psychotic features: Secondary | ICD-10-CM

## 2016-02-16 DIAGNOSIS — Z87891 Personal history of nicotine dependence: Secondary | ICD-10-CM | POA: Insufficient documentation

## 2016-02-16 DIAGNOSIS — M1990 Unspecified osteoarthritis, unspecified site: Secondary | ICD-10-CM | POA: Insufficient documentation

## 2016-02-16 DIAGNOSIS — F909 Attention-deficit hyperactivity disorder, unspecified type: Secondary | ICD-10-CM | POA: Diagnosis not present

## 2016-02-16 LAB — POCT PREGNANCY, URINE: Preg Test, Ur: NEGATIVE

## 2016-02-16 MED ORDER — KETOROLAC TROMETHAMINE 30 MG/ML IJ SOLN
30.0000 mg | Freq: Once | INTRAMUSCULAR | Status: AC
  Administered 2016-02-16: 30 mg via INTRAVENOUS

## 2016-02-16 MED ORDER — SODIUM CHLORIDE 0.9 % IV SOLN
250.0000 mL | Freq: Once | INTRAVENOUS | Status: AC
  Administered 2016-02-16: 500 mL via INTRAVENOUS

## 2016-02-16 MED ORDER — MIDAZOLAM HCL 2 MG/2ML IJ SOLN
2.0000 mg | Freq: Once | INTRAMUSCULAR | Status: AC
  Administered 2016-02-16: 2 mg via INTRAVENOUS

## 2016-02-16 MED ORDER — KETOROLAC TROMETHAMINE 30 MG/ML IJ SOLN
INTRAMUSCULAR | Status: AC
  Administered 2016-02-16: 30 mg via INTRAVENOUS
  Filled 2016-02-16: qty 1

## 2016-02-16 MED ORDER — SODIUM CHLORIDE 0.9 % IV SOLN
INTRAVENOUS | Status: DC | PRN
Start: 2016-02-16 — End: 2016-02-16
  Administered 2016-02-16: 10:00:00 via INTRAVENOUS

## 2016-02-16 MED ORDER — ONDANSETRON HCL 4 MG/2ML IJ SOLN
INTRAMUSCULAR | Status: AC
  Administered 2016-02-16: 4 mg via INTRAVENOUS
  Filled 2016-02-16: qty 2

## 2016-02-16 MED ORDER — ONDANSETRON HCL 4 MG/2ML IJ SOLN
4.0000 mg | Freq: Once | INTRAMUSCULAR | Status: AC
  Administered 2016-02-16: 4 mg via INTRAVENOUS

## 2016-02-16 MED ORDER — METHOHEXITAL SODIUM 100 MG/10ML IV SOSY
PREFILLED_SYRINGE | INTRAVENOUS | Status: DC | PRN
  Administered 2016-02-16: 100 mg via INTRAVENOUS

## 2016-02-16 MED ORDER — SUCCINYLCHOLINE CHLORIDE 20 MG/ML IJ SOLN
INTRAMUSCULAR | Status: DC | PRN
  Administered 2016-02-16: 120 mg via INTRAVENOUS

## 2016-02-16 NOTE — Anesthesia Procedure Notes (Signed)
Date/Time: 02/16/2016 10:17 AM Performed by: Ginger CarneMICHELET, Almando Brawley Pre-anesthesia Checklist: Patient identified, Timeout performed, Emergency Drugs available, Suction available and Patient being monitored Patient Re-evaluated:Patient Re-evaluated prior to inductionOxygen Delivery Method: Circle system utilized Preoxygenation: Pre-oxygenation with 100% oxygen Intubation Type: IV induction Ventilation: Mask ventilation without difficulty Placement Confirmation: positive ETCO2 Dental Injury: Teeth and Oropharynx as per pre-operative assessment

## 2016-02-16 NOTE — Anesthesia Preprocedure Evaluation (Signed)
Anesthesia Evaluation  Patient identified by MRN, date of birth, ID band Patient awake    Reviewed: Allergy & Precautions, NPO status , Patient's Chart, lab work & pertinent test results  History of Anesthesia Complications Negative for: history of anesthetic complications  Airway Mallampati: II       Dental  (+) Teeth Intact   Pulmonary former smoker,    breath sounds clear to auscultation       Cardiovascular Exercise Tolerance: Good negative cardio ROS   Rhythm:Regular     Neuro/Psych  Headaches, PSYCHIATRIC DISORDERS (Bipolar, ADHD)    GI/Hepatic negative GI ROS, Neg liver ROS,   Endo/Other  Morbid obesity  Renal/GU Renal disease (kidney stones)     Musculoskeletal   Abdominal (+) + obese,   Peds negative pediatric ROS (+)  Hematology   Anesthesia Other Findings Past Medical History:   ADHD (attention deficit hyperactivity disorder)              Bipolar disorder (HCC)                                       Anxiety                                                      Depression                                                   Chronic kidney disease                                         Comment:Kidney stones   Headache                                                     Arthritis                                                    Reproductive/Obstetrics negative OB ROS                             Anesthesia Physical  Anesthesia Plan  ASA: III  Anesthesia Plan: General   Post-op Pain Management:    Induction: Intravenous  Airway Management Planned: Mask  Additional Equipment:   Intra-op Plan:   Post-operative Plan:   Informed Consent: I have reviewed the patients History and Physical, chart, labs and discussed the procedure including the risks, benefits and alternatives for the proposed anesthesia with the patient or authorized representative who has indicated  his/her understanding and acceptance.     Plan Discussed with: CRNA, Anesthesiologist and Surgeon  Anesthesia Plan Comments:  Anesthesia Quick Evaluation  

## 2016-02-16 NOTE — Procedures (Signed)
ECT SERVICES Physician's Interval Evaluation & Treatment Note  Patient Identification: Brittany [name] MRN:  [id number] Date of Evaluation:  02/16/2016 TX #: 10  MADRS: 34  MMSE: 30  P.E. Findings:  No change to physical. Blood pressure slightly elevated. Otherwise normal. Heart and lungs normal to exam.  Psychiatric Interval Note:  Mood labile. Up and down. Not psychotic but tearful.  Subjective:  Patient is a 30 y.o. female seen for evaluation for Electroconvulsive Therapy. Patient feels like she has been emotionally more distressed  Treatment Summary:   [x]   Right Unilateral             []  Bilateral   % Energy : 0.3 ms 50%   Impedance: 1110 ohms  Seizure Energy Index: 14,904 V squared  Postictal Suppression Index: 94%  Seizure Concordance Index: 99%  Medications  Pre Shock: Zofran 4 mg, Toradol 30 mg, Brevital 100 mg, succinylcholine 120 mg  Post Shock: Versed 2 mg  Seizure Duration: 23 seconds by EMG, 36 seconds by EEG   Comments: Follow-up in 2 weeks on May 22   Lungs:  [x]   Clear to auscultation               []  Other:   Heart:    [x]   Regular rhythm             []  irregular rhythm    [x]   Previous H&P reviewed, patient examined and there are NO CHANGES                 []   Previous H&P reviewed, patient examined and there are changes noted.   Mordecai RasmussenJohn Rees Santistevan, MD 5/8/201710:12 AM

## 2016-02-16 NOTE — Discharge Instructions (Signed)
1)  The drugs that you have been given will stay in your system until tomorrow so for the       next 24 hours you should not:  A. Drive an automobile  B. Make any legal decisions  C. Drink any alcoholic beverages  2)  You may resume your regular meals upon return home.  3)  A responsible adult must take you home.  Someone should stay with you for a few          hours, then be available by phone for the remainder of the treatment day.  4)  You May experience any of the following symptoms:  Headache, Nausea and a dry mouth (due to the medications you were given),  temporary memory loss and some confusion, or sore muscles (a warm bath  should help this).  If you you experience any of these symptoms let us know on                your return visit.  5)  Report any of the following: any acute discomfort, severe headache, or temperature        greater than 100.5 F.   Also report any unusual redness, swelling, drainage, or pain         at your IV site.    You may report Symptoms to:  [facility] [place] [facility]          Phone: [phone], ECT Department           or Dr. Shary Keylapac's office [phone]  6)  Your next ECT Treatment is Day Monday  Date May 22  We will call 2 days prior to your scheduled appointment for arrival times.  7)  Nothing to eat or drink after midnight the night before your procedure.  8)  Take .     With a sip of water the morning of your procedure.  9)  Other Instructions: Call [phone] to cancel the morning of your procedure due         to illness or emergency.  10) We will call within 72 hours to assess how you are feeling.

## 2016-02-16 NOTE — H&P (Signed)
Brittany [name] [name] is an 30 y.o. female.   Chief Complaint: Mood has been variable. Up and down. Emotionally reactive. Tearful at times. No suicidal intent no psychosis. HPI: Chronic emotional hyperreactivity possible bipolar disorder or mood lability  Past Medical History  Diagnosis Date  . ADHD (attention deficit hyperactivity disorder)   . Bipolar disorder (HCC)   . Anxiety   . Depression   . Chronic kidney disease     Kidney stones  . Headache   . Arthritis     Past Surgical History  Procedure Laterality Date  . Foot surgery    . Tubal ligation    . Cesarean section    . Shoulder surgery Right   . Kidney surgery    . Novasure ablation  2010    Family History  Problem Relation Age of Onset  . Anxiety disorder Mother   . Depression Mother   . Drug abuse Mother   . Bipolar disorder Mother   . Depression Father   . Anxiety disorder Father   . Drug abuse Father   . Anxiety disorder Sister   . Depression Sister   . ADD / ADHD Sister   . Anxiety disorder Sister   . Depression Sister    Social History:  reports that she has quit smoking. Her smoking use included Cigarettes. She has never used smokeless tobacco. She reports that she drinks about 2.4 - 7.2 oz of alcohol per week. She reports that she uses illicit drugs (Marijuana).  Allergies:  Allergies  Allergen Reactions  . Ceclor [Cefaclor] Hives    "vomiting"     (Not in a hospital admission)  Results for orders placed or performed during the hospital encounter of 02/16/16 (from the past 48 hour(s))  Pregnancy, urine POC     Status: None   Collection Time: 02/16/16  8:58 AM  Result Value Ref Range   Preg Test, Ur NEGATIVE NEGATIVE    Comment:        THE SENSITIVITY OF THIS METHODOLOGY IS >24 mIU/mL    No results found.  Review of Systems  Constitutional: Negative.   HENT: Negative.   Eyes: Negative.   Respiratory: Negative.   Cardiovascular: Negative.   Gastrointestinal: Negative.   Musculoskeletal:  Negative.   Skin: Negative.   Neurological: Negative.   Psychiatric/Behavioral: Positive for depression. Negative for suicidal ideas, hallucinations, memory loss and substance abuse. The patient is nervous/anxious and has insomnia.     Blood pressure 134/98, pulse 90, temperature 96.7 F (35.9 C), temperature source Tympanic, resp. rate 18, height 5\' 4"  (1.626 m), weight 112.946 kg (249 lb). Physical Exam  Nursing note and vitals reviewed. Constitutional: She appears well-developed and well-nourished.  HENT:  Head: Normocephalic and atraumatic.  Eyes: Conjunctivae are normal. Pupils are equal, round, and reactive to light.  Neck: Normal range of motion.  Cardiovascular: Normal rate, regular rhythm and normal heart sounds.   Respiratory: Effort normal and breath sounds normal. No respiratory distress.  GI: Soft.  Musculoskeletal: Normal range of motion.  Neurological: She is alert.  Skin: Skin is warm and dry.  Psychiatric: Her speech is normal and behavior is normal. Judgment and thought content normal. Cognition and memory are normal. She exhibits a depressed mood.     Assessment/Plan Patient response to ECT and tolerates relatively well. We would like to see her back in 2 weeks this time rather than the 4 week she has weighed at this time. Patient agrees.  Mordecai RasmussenJohn Dondra Rhett, MD 02/16/2016, 10:09 AM

## 2016-02-16 NOTE — Transfer of Care (Signed)
Immediate Anesthesia Transfer of Care Note  Patient: Brittany [name]  Procedure(s) Performed: * No procedures listed *  Patient Location: PACU  Anesthesia Type:General  Level of Consciousness: awake  Airway & Oxygen Therapy: Patient Spontanous Breathing  Post-op Assessment: Report given to RN and Post -op Vital signs reviewed and stable  Post vital signs: Reviewed and stable  Last Vitals:  Filed Vitals:   02/16/16 0828  BP: 134/98  Pulse: 90  Temp: 35.9 C  Resp: 18    Last Pain: There were no vitals filed for this visit.    Patients Stated Pain Goal: 0 (02/16/16 [id number])  Complications: No apparent anesthesia complications

## 2016-02-17 NOTE — Anesthesia Postprocedure Evaluation (Signed)
Anesthesia Post Note  Patient: Brittany [name]  Procedure(s) Performed: * No procedures listed *  Patient location during evaluation: PACU Anesthesia Type: General Level of consciousness: awake and alert Pain management: pain level controlled Vital Signs Assessment: post-procedure vital signs reviewed and stable Respiratory status: spontaneous breathing, nonlabored ventilation, respiratory function stable and patient connected to nasal cannula oxygen Cardiovascular status: blood pressure returned to baseline and stable Postop Assessment: no signs of nausea or vomiting Anesthetic complications: no    Last Vitals:  Filed Vitals:   02/16/16 1100 02/16/16 1113  BP: 121/94 116/71  Pulse: 110 75  Temp: 36.7 C 36.7 C  Resp: 16 18    Last Pain: There were no vitals filed for this visit.               Lenard SimmerAndrew Coe Angelos

## 2016-02-18 ENCOUNTER — Telehealth: Payer: Self-pay

## 2016-03-01 ENCOUNTER — Telehealth: Payer: Self-pay

## 2016-04-11 DIAGNOSIS — F319 Bipolar disorder, unspecified: Secondary | ICD-10-CM

## 2016-04-11 DIAGNOSIS — T1491 Suicide attempt: Secondary | ICD-10-CM

## 2016-04-11 DIAGNOSIS — T56891A Toxic effect of other metals, accidental (unintentional), initial encounter: Secondary | ICD-10-CM | POA: Diagnosis not present

## 2016-04-12 DIAGNOSIS — F319 Bipolar disorder, unspecified: Secondary | ICD-10-CM | POA: Diagnosis not present

## 2016-04-12 DIAGNOSIS — T1491 Suicide attempt: Secondary | ICD-10-CM | POA: Diagnosis not present

## 2016-04-12 DIAGNOSIS — T56891A Toxic effect of other metals, accidental (unintentional), initial encounter: Secondary | ICD-10-CM | POA: Diagnosis not present

## 2016-04-14 DIAGNOSIS — F319 Bipolar disorder, unspecified: Secondary | ICD-10-CM | POA: Diagnosis not present

## 2016-04-14 DIAGNOSIS — T56891A Toxic effect of other metals, accidental (unintentional), initial encounter: Secondary | ICD-10-CM | POA: Diagnosis not present

## 2016-04-14 DIAGNOSIS — T1491 Suicide attempt: Secondary | ICD-10-CM | POA: Diagnosis not present

## 2016-04-26 ENCOUNTER — Telehealth: Payer: Self-pay | Admitting: *Deleted

## 2016-05-03 ENCOUNTER — Encounter: Payer: Self-pay | Admitting: Anesthesiology

## 2016-05-03 ENCOUNTER — Inpatient Hospital Stay
Admit: 2016-05-03 | Discharge: 2016-05-07 | DRG: 885 | Disposition: A | Discharge status: Home / Self Care | Payer: Medicaid Other | Source: Intra-hospital | Attending: Psychiatry | Admitting: Psychiatry

## 2016-05-03 ENCOUNTER — Ambulatory Visit
Admission: RE | Admit: 2016-05-03 | Discharge: 2016-05-03 | Disposition: A | Discharge status: Home / Self Care | Payer: Medicaid Other | Source: Ambulatory Visit | Attending: Psychiatry | Admitting: Psychiatry

## 2016-05-03 ENCOUNTER — Encounter: Payer: Self-pay | Admitting: Emergency Medicine

## 2016-05-03 ENCOUNTER — Emergency Department
Admission: EM | Admit: 2016-05-03 | Discharge: 2016-05-03 | Payer: Medicaid Other | Attending: Emergency Medicine | Admitting: Emergency Medicine

## 2016-05-03 DIAGNOSIS — Z9889 Other specified postprocedural states: Secondary | ICD-10-CM | POA: Diagnosis not present

## 2016-05-03 DIAGNOSIS — Z915 Personal history of self-harm: Secondary | ICD-10-CM

## 2016-05-03 DIAGNOSIS — F339 Major depressive disorder, recurrent, unspecified: Secondary | ICD-10-CM | POA: Diagnosis present

## 2016-05-03 DIAGNOSIS — M1990 Unspecified osteoarthritis, unspecified site: Secondary | ICD-10-CM | POA: Diagnosis present

## 2016-05-03 DIAGNOSIS — R45851 Suicidal ideations: Secondary | ICD-10-CM | POA: Diagnosis not present

## 2016-05-03 DIAGNOSIS — N189 Chronic kidney disease, unspecified: Secondary | ICD-10-CM | POA: Insufficient documentation

## 2016-05-03 DIAGNOSIS — G4700 Insomnia, unspecified: Secondary | ICD-10-CM | POA: Diagnosis present

## 2016-05-03 DIAGNOSIS — Z9119 Patient's noncompliance with other medical treatment and regimen: Secondary | ICD-10-CM | POA: Diagnosis not present

## 2016-05-03 DIAGNOSIS — F331 Major depressive disorder, recurrent, moderate: Principal | ICD-10-CM | POA: Diagnosis present

## 2016-05-03 DIAGNOSIS — Z87442 Personal history of urinary calculi: Secondary | ICD-10-CM | POA: Insufficient documentation

## 2016-05-03 DIAGNOSIS — F3130 Bipolar disorder, current episode depressed, mild or moderate severity, unspecified: Secondary | ICD-10-CM

## 2016-05-03 DIAGNOSIS — F909 Attention-deficit hyperactivity disorder, unspecified type: Secondary | ICD-10-CM | POA: Insufficient documentation

## 2016-05-03 DIAGNOSIS — Z888 Allergy status to other drugs, medicaments and biological substances status: Secondary | ICD-10-CM | POA: Insufficient documentation

## 2016-05-03 DIAGNOSIS — F4310 Post-traumatic stress disorder, unspecified: Secondary | ICD-10-CM | POA: Diagnosis present

## 2016-05-03 DIAGNOSIS — Z818 Family history of other mental and behavioral disorders: Secondary | ICD-10-CM

## 2016-05-03 DIAGNOSIS — Z87891 Personal history of nicotine dependence: Secondary | ICD-10-CM

## 2016-05-03 DIAGNOSIS — T50901A Poisoning by unspecified drugs, medicaments and biological substances, accidental (unintentional), initial encounter: Secondary | ICD-10-CM

## 2016-05-03 DIAGNOSIS — F419 Anxiety disorder, unspecified: Secondary | ICD-10-CM | POA: Diagnosis not present

## 2016-05-03 DIAGNOSIS — Z62810 Personal history of physical and sexual abuse in childhood: Secondary | ICD-10-CM | POA: Diagnosis present

## 2016-05-03 DIAGNOSIS — F319 Bipolar disorder, unspecified: Secondary | ICD-10-CM | POA: Diagnosis present

## 2016-05-03 DIAGNOSIS — Z9851 Tubal ligation status: Secondary | ICD-10-CM | POA: Diagnosis not present

## 2016-05-03 DIAGNOSIS — Z91199 Patient's noncompliance with other medical treatment and regimen due to unspecified reason: Secondary | ICD-10-CM

## 2016-05-03 DIAGNOSIS — F603 Borderline personality disorder: Secondary | ICD-10-CM | POA: Diagnosis present

## 2016-05-03 DIAGNOSIS — F1290 Cannabis use, unspecified, uncomplicated: Secondary | ICD-10-CM | POA: Insufficient documentation

## 2016-05-03 DIAGNOSIS — Z79899 Other long term (current) drug therapy: Secondary | ICD-10-CM | POA: Insufficient documentation

## 2016-05-03 DIAGNOSIS — F3163 Bipolar disorder, current episode mixed, severe, without psychotic features: Secondary | ICD-10-CM

## 2016-05-03 DIAGNOSIS — F315 Bipolar disorder, current episode depressed, severe, with psychotic features: Secondary | ICD-10-CM | POA: Insufficient documentation

## 2016-05-03 DIAGNOSIS — F1220 Cannabis dependence, uncomplicated: Secondary | ICD-10-CM

## 2016-05-03 HISTORY — DX: Bipolar disorder, current episode depressed, mild or moderate severity, unspecified: F31.30

## 2016-05-03 LAB — COMPREHENSIVE METABOLIC PANEL
ALT: 17 U/L (ref 14–54)
AST: 21 U/L (ref 15–41)
Albumin: 4 g/dL (ref 3.5–5.0)
Alkaline Phosphatase: 57 U/L (ref 38–126)
Anion gap: 8 (ref 5–15)
BUN: 14 mg/dL (ref 6–20)
CHLORIDE: 106 mmol/L (ref 101–111)
CO2: 22 mmol/L (ref 22–32)
CREATININE: 0.84 mg/dL (ref 0.44–1.00)
Calcium: 9.1 mg/dL (ref 8.9–10.3)
Glucose, Bld: 102 mg/dL — ABNORMAL HIGH (ref 65–99)
Potassium: 4.5 mmol/L (ref 3.5–5.1)
Sodium: 136 mmol/L (ref 135–145)
Total Bilirubin: 0.4 mg/dL (ref 0.3–1.2)
Total Protein: 7.1 g/dL (ref 6.5–8.1)

## 2016-05-03 LAB — POCT PREGNANCY, URINE: PREG TEST UR: NEGATIVE

## 2016-05-03 LAB — URINE DRUG SCREEN, QUALITATIVE (ARMC ONLY)
AMPHETAMINES, UR SCREEN: POSITIVE — AB
Barbiturates, Ur Screen: NOT DETECTED
Benzodiazepine, Ur Scrn: POSITIVE — AB
Cannabinoid 50 Ng, Ur ~~LOC~~: NOT DETECTED
Cocaine Metabolite,Ur ~~LOC~~: NOT DETECTED
MDMA (ECSTASY) UR SCREEN: NOT DETECTED
METHADONE SCREEN, URINE: NOT DETECTED
Opiate, Ur Screen: NOT DETECTED
PHENCYCLIDINE (PCP) UR S: NOT DETECTED
Tricyclic, Ur Screen: NOT DETECTED

## 2016-05-03 LAB — ETHANOL

## 2016-05-03 LAB — CBC
HCT: 39.3 % (ref 35.0–47.0)
Hemoglobin: 13.3 g/dL (ref 12.0–16.0)
MCH: 30.4 pg (ref 26.0–34.0)
MCHC: 33.8 g/dL (ref 32.0–36.0)
MCV: 89.7 fL (ref 80.0–100.0)
PLATELETS: 189 10*3/uL (ref 150–440)
RBC: 4.38 MIL/uL (ref 3.80–5.20)
RDW: 12.8 % (ref 11.5–14.5)
WBC: 9.5 10*3/uL (ref 3.6–11.0)

## 2016-05-03 LAB — LITHIUM LEVEL: Lithium Lvl: <0.06 mmol/L — ABNORMAL LOW (ref 0.60–1.20)

## 2016-05-03 LAB — ACETAMINOPHEN LEVEL: Acetaminophen (Tylenol), Serum: <10 ug/mL — ABNORMAL LOW (ref 10–30)

## 2016-05-03 LAB — SALICYLATE LEVEL

## 2016-05-03 MED ORDER — KETOROLAC TROMETHAMINE 30 MG/ML IJ SOLN
30.0000 mg | Freq: Once | INTRAMUSCULAR | Status: AC
Start: 2016-05-03 — End: 2016-05-03
  Administered 2016-05-03: 30 mg via INTRAVENOUS

## 2016-05-03 MED ORDER — LITHIUM CARBONATE 300 MG PO TABS: 300.0000 mg | ORAL_TABLET | Freq: Every morning | ORAL | Status: DC

## 2016-05-03 MED ORDER — TRAZODONE HCL 100 MG PO TABS: 600.0000 mg | ORAL_TABLET | Freq: Every day | ORAL | Status: DC

## 2016-05-03 MED ORDER — SODIUM CHLORIDE 0.9 % IV SOLN
INTRAVENOUS | Status: DC | PRN
  Administered 2016-05-03: 11:00:00 via INTRAVENOUS

## 2016-05-03 MED ORDER — ONDANSETRON HCL 4 MG/2ML IJ SOLN
4.0000 mg | Freq: Once | INTRAMUSCULAR | Status: AC
  Administered 2016-05-03: 4 mg via INTRAVENOUS

## 2016-05-03 MED ORDER — METHOHEXITAL SODIUM 100 MG/10ML IV SOSY
PREFILLED_SYRINGE | INTRAVENOUS | Status: DC | PRN
  Administered 2016-05-03: 100 mg via INTRAVENOUS

## 2016-05-03 MED ORDER — ALPRAZOLAM 0.5 MG PO TABS
ORAL_TABLET | ORAL | Status: AC
  Filled 2016-05-03: qty 2

## 2016-05-03 MED ORDER — TRAZODONE HCL 100 MG PO TABS
200.0000 mg | ORAL_TABLET | Freq: Every day | ORAL | Status: DC
  Administered 2016-05-03: 200 mg via ORAL
  Filled 2016-05-03: qty 2

## 2016-05-03 MED ORDER — ALPRAZOLAM 0.5 MG PO TABS
1.0000 mg | ORAL_TABLET | Freq: Three times a day (TID) | ORAL | Status: DC
  Administered 2016-05-03 (×2): 1 mg via ORAL
  Filled 2016-05-03: qty 2

## 2016-05-03 MED ORDER — AMPHETAMINE-DEXTROAMPHETAMINE 5 MG PO TABS: 30.0000 mg | ORAL_TABLET | Freq: Every day | ORAL | Status: DC

## 2016-05-03 MED ORDER — HYDROXYZINE HCL 25 MG PO TABS: 50.0000 mg | ORAL_TABLET | Freq: Three times a day (TID) | ORAL | Status: DC | PRN

## 2016-05-03 MED ORDER — KETOROLAC TROMETHAMINE 30 MG/ML IJ SOLN
INTRAMUSCULAR | Status: AC
  Administered 2016-05-03: 30 mg via INTRAVENOUS
  Filled 2016-05-03: qty 1

## 2016-05-03 MED ORDER — ALPRAZOLAM 1 MG PO TABS: 1.0000 mg | ORAL_TABLET | Freq: Three times a day (TID) | ORAL | Status: DC

## 2016-05-03 MED ORDER — SUCCINYLCHOLINE CHLORIDE 20 MG/ML IJ SOLN
INTRAMUSCULAR | Status: DC | PRN
  Administered 2016-05-03: 120 mg via INTRAVENOUS

## 2016-05-03 MED ORDER — SODIUM CHLORIDE 0.9 % IV SOLN: 250.0000 mL | Freq: Once | INTRAVENOUS | Status: DC

## 2016-05-03 MED ORDER — ONDANSETRON HCL 4 MG/2ML IJ SOLN
INTRAMUSCULAR | Status: AC
  Administered 2016-05-03: 4 mg via INTRAVENOUS
  Filled 2016-05-03: qty 2

## 2016-05-03 MED ORDER — ACETAMINOPHEN 325 MG PO TABS
650.0000 mg | ORAL_TABLET | Freq: Four times a day (QID) | ORAL | Status: DC | PRN
  Administered 2016-05-04 – 2016-05-07 (×2): 650 mg via ORAL
  Filled 2016-05-03 (×3): qty 2

## 2016-05-03 MED ORDER — DOXYLAMINE SUCCINATE (SLEEP) 25 MG PO TABS
25.0000 mg | ORAL_TABLET | Freq: Every evening | ORAL | Status: DC | PRN
  Filled 2016-05-03 (×2): qty 1

## 2016-05-03 MED ORDER — TRAZODONE HCL 100 MG PO TABS: 200.0000 mg | ORAL_TABLET | Freq: Every day | ORAL | Status: DC

## 2016-05-03 MED ORDER — LITHIUM CARBONATE ER 450 MG PO TBCR
450.0000 mg | EXTENDED_RELEASE_TABLET | Freq: Two times a day (BID) | ORAL | Status: DC
  Administered 2016-05-03: 450 mg via ORAL
  Filled 2016-05-03: qty 1

## 2016-05-03 MED ORDER — HYDROXYZINE HCL 50 MG PO TABS: 50.0000 mg | ORAL_TABLET | Freq: Three times a day (TID) | ORAL | Status: DC | PRN

## 2016-05-03 MED ORDER — AMPHETAMINE-DEXTROAMPHETAMINE 5 MG PO TABS: 15.0000 mg | ORAL_TABLET | Freq: Every day | ORAL | Status: DC

## 2016-05-03 MED ORDER — LITHIUM CARBONATE ER 450 MG PO TBCR: 450.0000 mg | EXTENDED_RELEASE_TABLET | Freq: Two times a day (BID) | ORAL | Status: DC

## 2016-05-03 MED ORDER — AMPHETAMINE-DEXTROAMPHETAMINE 5 MG PO TABS
30.0000 mg | ORAL_TABLET | Freq: Every day | ORAL | Status: DC
Start: 2016-05-04 — End: 2016-05-04

## 2016-05-03 MED ORDER — KETOROLAC TROMETHAMINE 30 MG/ML IJ SOLN: 30.0000 mg | Freq: Once | INTRAMUSCULAR | Status: DC

## 2016-05-03 MED ORDER — MIDAZOLAM HCL 2 MG/2ML IJ SOLN: 2.0000 mg | Freq: Once | INTRAMUSCULAR | Status: DC

## 2016-05-03 MED ORDER — ALUM & MAG HYDROXIDE-SIMETH 200-200-20 MG/5ML PO SUSP
30.0000 mL | ORAL | Status: DC | PRN
  Administered 2016-05-04 – 2016-05-05 (×2): 30 mL via ORAL
  Filled 2016-05-03 (×2): qty 30

## 2016-05-03 MED ORDER — MIDAZOLAM HCL 2 MG/2ML IJ SOLN
2.0000 mg | Freq: Once | INTRAMUSCULAR | Status: AC
  Administered 2016-05-03: 2 mg via INTRAVENOUS

## 2016-05-03 MED ORDER — LITHIUM CARBONATE 300 MG PO CAPS: 300.0000 mg | ORAL_CAPSULE | Freq: Every day | ORAL | Status: DC

## 2016-05-03 MED ORDER — MAGNESIUM HYDROXIDE 400 MG/5ML PO SUSP: 30.0000 mL | Freq: Every day | ORAL | Status: DC | PRN

## 2016-05-03 MED ORDER — AMPHETAMINE-DEXTROAMPHETAMINE 5 MG PO TABS
30.0000 mg | ORAL_TABLET | Freq: Every day | ORAL | Status: DC
  Filled 2016-05-03: qty 6

## 2016-05-03 MED ORDER — ALPRAZOLAM 1 MG PO TABS
2.0000 mg | ORAL_TABLET | Freq: Three times a day (TID) | ORAL | Status: DC | PRN
  Administered 2016-05-04: 2 mg via ORAL
  Filled 2016-05-03: qty 2

## 2016-05-03 MED ORDER — SODIUM CHLORIDE 0.9 % IV SOLN
250.0000 mL | Freq: Once | INTRAVENOUS | Status: AC
  Administered 2016-05-03: 500 mL via INTRAVENOUS

## 2016-05-03 MED ORDER — ALPRAZOLAM 0.5 MG PO TABS
ORAL_TABLET | ORAL | Status: AC
  Filled 2016-05-03: qty 4

## 2016-05-03 NOTE — ED Notes (Signed)
Patient sitting in dayroom, socializing with peer. In no apparent distress. Will continue to monitor and maintain all safety checks.

## 2016-05-03 NOTE — Progress Notes (Signed)
Dr. Toni Amend here and notified of patient's suicidal attempt

## 2016-05-03 NOTE — Anesthesia Preprocedure Evaluation (Signed)
Anesthesia Evaluation  Patient identified by MRN, date of birth, ID band Patient awake    Reviewed: Allergy & Precautions, NPO status , Patient's Chart, lab work & pertinent test results  History of Anesthesia Complications Negative for: history of anesthetic complications  Airway Mallampati: II       Dental  (+) Teeth Intact   Pulmonary former smoker,    breath sounds clear to auscultation- rhonchi (-) wheezing      Cardiovascular Exercise Tolerance: Good (-) hypertension(-) CAD and (-) Past MI negative cardio ROS   Rhythm:Regular Rate:Normal - Systolic murmurs and - Diastolic murmurs    Neuro/Psych  Headaches, PSYCHIATRIC DISORDERS (Bipolar, ADHD) Anxiety Depression Bipolar Disorder    GI/Hepatic negative GI ROS, Neg liver ROS,   Endo/Other  negative endocrine ROSneg diabetesMorbid obesity  Renal/GU Renal disease (kidney stones)     Musculoskeletal negative musculoskeletal ROS (+)   Abdominal (+) + obese,   Peds negative pediatric ROS (+)  Hematology negative hematology ROS (+)   Anesthesia Other Findings Past Medical History:   ADHD (attention deficit hyperactivity disorder)              Bipolar disorder (HCC)                                       Anxiety                                                      Depression                                                   Chronic kidney disease                                         Comment:Kidney stones   Headache                                                     Arthritis                                                    Reproductive/Obstetrics negative OB ROS                             Anesthesia Physical  Anesthesia Plan  ASA: III  Anesthesia Plan: General   Post-op Pain Management:    Induction: Intravenous  Airway Management Planned: Mask  Additional Equipment:   Intra-op Plan:   Post-operative Plan:    Informed Consent: I have reviewed the patients History and Physical, chart, labs and discussed the procedure including the risks, benefits and alternatives for the proposed anesthesia with the patient or  authorized representative who has indicated his/her understanding and acceptance.     Plan Discussed with: CRNA, Anesthesiologist and Surgeon  Anesthesia Plan Comments:         Anesthesia Quick Evaluation
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
## 2016-05-03 NOTE — ED Provider Notes (Signed)
[facility] Emergency Department Provider Note    ____________________________________________   I have reviewed the triage vital signs and the nursing notes.   HISTORY  Chief Complaint Psychiatric Evaluation and Suicidal   History limited by: Not Limited   HPI Brittany [name] is a 30 y.o. female who presents to the emergency department today under IVC. Recently had a hospitalization at [facility]. Was here for ECT today. Dr. Toni Amend with psychiatry IVC'd her at ECT. She did complete ECT. She states that she has been feeling depressed and hopeless for quite some time. Feels like the ECT has helped some in the past.    Past Medical History:  Diagnosis Date  . ADHD (attention deficit hyperactivity disorder)   . Anxiety   . Arthritis   . Bipolar disorder (HCC)   . Chronic kidney disease    Kidney stones  . Depression   . Headache     Patient Active Problem List   Diagnosis Date Noted  . Bipolar 2 disorder, major depressive episode (HCC)   . Bipolar I disorder, most recent episode depressed (HCC)   . Severe recurrent major depression without psychotic features [facility])     Past Surgical History:  Procedure Laterality Date  . CESAREAN SECTION    . FOOT SURGERY    . KIDNEY SURGERY    . NOVASURE ABLATION  2010  . SHOULDER SURGERY Right   . TUBAL LIGATION      Current Outpatient Rx  . Order #: [id number] Class: Historical Med  . Order #: [id number] Class: Historical Med  . Order #: [id number] Class: Historical Med  . Order #: [id number] Class: Historical Med  . Order #: [id number] Class: Historical Med  . Order #: [id number] Class: Historical Med    Allergies Ceclor [cefaclor]  Family History  Problem Relation Age of Onset  . Anxiety disorder Mother   . Depression Mother   . Drug abuse Mother   . Bipolar disorder Mother   . Depression Father   . Anxiety disorder Father   . Drug abuse Father   . Anxiety disorder Sister   . Depression Sister   .  ADD / ADHD Sister   . Anxiety disorder Sister   . Depression Sister     Social History Social History  Substance Use Topics  . Smoking status: Former Smoker    Types: Cigarettes  . Smokeless tobacco: Never Used  . Alcohol use 2.4 - 7.2 oz/week    4 Glasses of wine per week    Review of Systems  Constitutional: Negative for fever. Cardiovascular: Negative for chest pain. Respiratory: Negative for shortness of breath. Gastrointestinal: Negative for abdominal pain, vomiting and diarrhea. Neurological: Negative for headaches, focal weakness or numbness.  10-point ROS otherwise negative.  ____________________________________________   PHYSICAL EXAM:  VITAL SIGNS: ED Triage Vitals  Enc Vitals Group     BP 05/03/16 1144 (!) 120/59     Pulse Rate 05/03/16 1144 98     Resp 05/03/16 1144 16     Temp 05/03/16 1144 97.8 F (36.6 C)     Temp src --      SpO2 05/03/16 1144 98 %     Weight 05/03/16 1144 246 lb (111.6 kg)     Height 05/03/16 1144  (1.626 m)     Head Circumference --      Peak Flow --      Pain Score 05/03/16 1145 10   Constitutional: Alert and oriented. Eyes: Conjunctivae are normal. PERRL. Normal extraocular movements. ENT  Head: Normocephalic and atraumatic.   Nose: No congestion/rhinnorhea.   Mouth/Throat: Mucous membranes are moist.   Neck: No stridor. Hematological/Lymphatic/Immunilogical: No cervical lymphadenopathy. Cardiovascular: Normal rate, regular rhythm.  No murmurs, rubs, or gallops. Respiratory: Normal respiratory effort without tachypnea nor retractions. Breath sounds are clear and equal bilaterally. No wheezes/rales/rhonchi. Gastrointestinal: Soft and nontender. No distention.  Genitourinary: Deferred Musculoskeletal: Normal range of motion in all extremities. No joint effusions.  No lower extremity tenderness nor edema. Neurologic:  Normal speech and language. No gross focal neurologic deficits are appreciated.  Skin:   Skin is warm, dry and intact. No rash noted. Psychiatric: Depressed. Endorses SI  ____________________________________________    LABS (pertinent positives/negatives)  Labs Reviewed  COMPREHENSIVE METABOLIC PANEL - Abnormal; Notable for the following:       Result Value   Glucose, Bld 102 (*)    All other components within normal limits  ACETAMINOPHEN LEVEL - Abnormal; Notable for the following:    Acetaminophen (Tylenol), Serum <10 (*)    All other components within normal limits  ETHANOL  SALICYLATE LEVEL  CBC     ____________________________________________   EKG  None  ____________________________________________    RADIOLOGY  None  ____________________________________________   PROCEDURES  Procedures  ____________________________________________   INITIAL IMPRESSION / ASSESSMENT AND PLAN / ED COURSE  Pertinent labs & imaging results that were available during my care of the patient were reviewed by me and considered in my medical decision making (see chart for details).  Patient to the ED IVC'ed by psychiatry at ECT. No medical complaints.   ____________________________________________   FINAL CLINICAL IMPRESSION(S) / ED DIAGNOSES  Suicidal Ideation  Note: This dictation was prepared with Dragon dictation. Any transcriptional errors that result from this process are unintentional    Phineas Semen, MD 05/04/16 1506

## 2016-05-03 NOTE — Procedures (Signed)
ECT SERVICES Physician's Interval Evaluation & Treatment Note  Patient Identification: Brittany [name] MRN:  [id number] Date of Evaluation:  05/03/2016 TX #: [id number]  MADRS:   MMSE:   P.E. Findings:  Lungs clear heart normal. Blood pressure up a little bit. No other new findings  Psychiatric Interval Note:  Patient is tearful and agitated labile actively talking about suicidal ideation  Subjective:  Patient is a 30 y.o. female seen for evaluation for Electroconvulsive Therapy. "Very depressed"  Treatment Summary:   [x]   Right Unilateral             []  Bilateral   % Energy : 0.3 ms 60%   Impedance: 1370 ohms  Seizure Energy Index: 5030 V squared  Postictal Suppression Index: 77%  Seizure Concordance Index: 98%  Medications  Pre Shock: Zofran 4 mg, Toradol 30 mg, Brevital 100 mg, succinylcholine 120 mg  Post Shock:    Seizure Duration: 28 seconds by EMG, 44 seconds by EEG   Comments: Patient will be transferred to the emergency room pending admission   Lungs:  [x]   Clear to auscultation               []  Other:   Heart:    [x]   Regular rhythm             []  irregular rhythm    [x]   Previous H&P reviewed, patient examined and there are NO CHANGES                 []   Previous H&P reviewed, patient examined and there are changes noted.   Mordecai Rasmussen, MD 7/24/201710:45 AM

## 2016-05-03 NOTE — ED Notes (Signed)
Patient in dayroom. Anticipating transfer to [facility]. Maintained on 15 minute checks and observation by security camera for safety.

## 2016-05-03 NOTE — Consult Note (Signed)
[place] [facility] Consult   Reason for Consult:  Consult for 30 year old woman with history of bipolar disorder currently with suicidal ideation. Referring Physician:  Archie Balboa Patient Identification: Valeria Boza MRN:  [id number] Principal Diagnosis: Bipolar 1 disorder, mixed, severe ([place]) Diagnosis:   Patient Active Problem List   Diagnosis Date Noted  . Bipolar 1 disorder, mixed, severe ([place]) [F31.63] 05/03/2016  . ADHD (attention deficit hyperactivity disorder) [F90.9] 05/03/2016  . Suicidal ideation [R45.851] 05/03/2016  . Noncompliance [Z91.19] 05/03/2016  . Bipolar 2 disorder, major depressive episode ([place]) [F31.81]   . Bipolar I disorder, most recent episode depressed ([place]) [F31.30]   . Severe recurrent major depression without psychotic features ([place]) [F33.2]     Total Time spent with patient: 1 hour  Subjective:   Morgana Rowley is a 30 y.o. female patient admitted with "I've just been a mess".  HPI:  Patient interviewed. Chart reviewed. Labs reviewed. 30 year old woman came this morning for a scheduled ECT appointment. Prior to the appointment she gave a history of very recent decompensation with active suicidal thoughts. She was tearful and agitated and saying that she had active thoughts of going home and killing herself. Patient is a little bit calmer this afternoon but still says that she has been very depressed and anxious just for the last 24-48 hours. She can't identify any particular stress that brought this on. Patient admits that she had been having suicidal thoughts. Had not been having hallucinations but does feel like she has more than 1 person at times in her mind directing her. She had been noncompliant with her prescription psychiatric medicine for the last 2-3 weeks. She had a hospitalization at [facility] in the first week of July because of an overdose on lithium and since that time had not been taking her medicine except for her Xanax. Patient  says her mood today is a little better but she knows she still has had suicidal thoughts. She has some memory loss just of the last few days. That actually predated the ECT treatment. Her sleep patterns are chronically chaotic. She denies that she had been drinking or abusing any drugs.  Social history: Patient lives with her husband. She does not work outside the home regularly. She and her husband have a somewhat strained relationship because of the severity and chronic nature of her illness.  Medical history: Overweight. Otherwise denies any significant medical problems outside of the psychiatric.  Substance abuse history: Denies that she's been abusing drugs or alcohol.  Past Psychiatric History: Patient has a history of several psychiatric hospitalizations and suicide attempts in the past. Patient has a history of mood instability. With previously taking lithium and had gotten some benefit from ECT treatments. She was supposed to return for ECT treatment 2 weeks after her last treatment which was on May 8. She did not follow-up with me and in fact has not been following up with an outpatient psychiatrist either. Recent hospitalization at [facility]. She agreed with them at the time of discharge that she was going to be admitted to a psychiatric unit but did not follow-up with that either.  Risk to Self: Is patient at risk for suicide?: Yes Risk to Others:   Prior Inpatient Therapy:   Prior Outpatient Therapy:    Past Medical History:  Past Medical History:  Diagnosis Date  . ADHD (attention deficit hyperactivity disorder)   . Anxiety   . Arthritis   . Bipolar disorder ([place])   . Chronic kidney disease  Kidney stones  . Depression   . Headache     Past Surgical History:  Procedure Laterality Date  . CESAREAN SECTION    . FOOT SURGERY    . KIDNEY SURGERY    . [place]  2010  . SHOULDER SURGERY Right   . TUBAL LIGATION     Family History:  Family History   Problem Relation Age of Onset  . Anxiety disorder Mother   . Depression Mother   . Drug abuse Mother   . Bipolar disorder Mother   . Depression Father   . Anxiety disorder Father   . Drug abuse Father   . Anxiety disorder Sister   . Depression Sister   . ADD / ADHD Sister   . Anxiety disorder Sister   . Depression Sister    Family Psychiatric  History: Patient has a family history of bipolar disorder Social History:  History  Alcohol Use  . 2.4 - 7.2 oz/week  . 4 Glasses of wine per week     History  Drug Use  . Types: Marijuana    Comment: last used about 3 weeks ago    Social History   Social History  . Marital status: Married    Spouse name: N/A  . Number of children: N/A  . Years of education: N/A   Social History Main Topics  . Smoking status: Former Smoker    Types: Cigarettes  . Smokeless tobacco: Never Used  . Alcohol use 2.4 - 7.2 oz/week    4 Glasses of wine per week  . Drug use:     Types: Marijuana     Comment: last used about 3 weeks ago  . Sexual activity: Yes    Birth control/ protection: None   Other Topics Concern  . None   Social History Narrative  . None   Additional Social History:    Allergies:   Allergies  Allergen Reactions  . Ceclor [Cefaclor] Hives and Nausea And Vomiting    "vomiting"    Labs:  Results for orders placed or performed during the hospital encounter of 05/03/16 (from the past 48 hour(s))  Comprehensive metabolic panel     Status: Abnormal   Collection Time: 05/03/16 11:55 AM  Result Value Ref Range   Sodium 136 135 - 145 mmol/L   Potassium 4.5 3.5 - 5.1 mmol/L   Chloride 106 101 - 111 mmol/L   CO2 22 22 - 32 mmol/L   Glucose, Bld 102 (H) 65 - 99 mg/dL   BUN 14 6 - 20 mg/dL   Creatinine, Ser 0.84 0.44 - 1.00 mg/dL   Calcium 9.1 8.9 - 10.3 mg/dL   Total Protein 7.1 6.5 - 8.1 g/dL   Albumin 4.0 3.5 - 5.0 g/dL   AST 21 15 - 41 U/L   ALT 17 14 - 54 U/L   Alkaline Phosphatase 57 38 - 126 U/L   Total  Bilirubin 0.4 0.3 - 1.2 mg/dL   GFR calc non Af Amer >60 >60 mL/min   GFR calc Af Amer >60 >60 mL/min    Comment: (NOTE) The eGFR has been calculated using the CKD EPI equation. This calculation has not been validated in all clinical situations. eGFR's persistently <60 mL/min signify possible Chronic Kidney Disease.    Anion gap 8 5 - 15  Ethanol     Status: None   Collection Time: 05/03/16 11:55 AM  Result Value Ref Range   Alcohol, Ethyl (B) <5 <5 mg/dL  Comment:        LOWEST DETECTABLE LIMIT FOR SERUM ALCOHOL IS 5 mg/dL FOR MEDICAL PURPOSES ONLY   Salicylate level     Status: None   Collection Time: 05/03/16 11:55 AM  Result Value Ref Range   Salicylate Lvl <5.0 2.8 - 30.0 mg/dL  Acetaminophen level     Status: Abnormal   Collection Time: 05/03/16 11:55 AM  Result Value Ref Range   Acetaminophen (Tylenol), Serum <10 (L) 10 - 30 ug/mL    Comment:        THERAPEUTIC CONCENTRATIONS VARY SIGNIFICANTLY. A RANGE OF 10-30 ug/mL MAY BE AN EFFECTIVE CONCENTRATION FOR MANY PATIENTS. HOWEVER, SOME ARE BEST TREATED AT CONCENTRATIONS OUTSIDE THIS RANGE. ACETAMINOPHEN CONCENTRATIONS >150 ug/mL AT 4 HOURS AFTER INGESTION AND >50 ug/mL AT 12 HOURS AFTER INGESTION ARE OFTEN ASSOCIATED WITH TOXIC REACTIONS.   cbc     Status: None   Collection Time: 05/03/16 11:55 AM  Result Value Ref Range   WBC 9.5 3.6 - 11.0 K/uL   RBC 4.38 3.80 - 5.20 MIL/uL   Hemoglobin 13.3 12.0 - 16.0 g/dL   HCT 39.3 35.0 - 47.0 %   MCV 89.7 80.0 - 100.0 fL   MCH 30.4 26.0 - 34.0 pg   MCHC 33.8 32.0 - 36.0 g/dL   RDW 12.8 11.5 - 14.5 %   Platelets 189 150 - 440 K/uL    Current Facility-Administered Medications  Medication Dose Route Frequency Provider Last Rate Last Dose  . ALPRAZolam (XANAX) tablet 1 mg  1 mg Oral TID Gonzella Lex, MD      . Derrill Memo ON 05/04/2016] amphetamine-dextroamphetamine (ADDERALL) tablet 15 mg  15 mg Oral QPC lunch Gonzella Lex, MD      . Derrill Memo ON 05/04/2016]  amphetamine-dextroamphetamine (ADDERALL) tablet 30 mg  30 mg Oral Q breakfast Gonzella Lex, MD      . hydrOXYzine (ATARAX/VISTARIL) tablet 50 mg  50 mg Oral TID PRN Gonzella Lex, MD      . lithium carbonate (ESKALITH) CR tablet 450 mg  450 mg Oral Q12H Gonzella Lex, MD      . traZODone (DESYREL) tablet 200 mg  200 mg Oral QHS Gonzella Lex, MD       Current Outpatient Prescriptions  Medication Sig Dispense Refill  . alprazolam (XANAX) 2 MG tablet Take 2 mg by mouth 3 (three) times daily as needed for sleep.    Marland Kitchen amphetamine-dextroamphetamine (ADDERALL) 30 MG tablet Take 30 mg by mouth daily.    Marland Kitchen doxylamine, Sleep, (UNISOM) 25 MG tablet Take 25 mg by mouth at bedtime as needed for sleep. Reported on 02/16/2016    . lithium 300 MG tablet Take 300 mg by mouth every morning. Reported on 12/15/2015    . lithium carbonate 300 MG capsule Take 300 mg by mouth at bedtime.    . trazodone (DESYREL) 300 MG tablet Take 600 mg by mouth at bedtime.      Facility-Administered Medications Ordered in Other Encounters  Medication Dose Route Frequency Provider Last Rate Last Dose  . 0.9 %  sodium chloride infusion  250 mL Intravenous Once Gonzella Lex, MD      . ketorolac (TORADOL) 30 MG/ML injection 30 mg  30 mg Intravenous Once Gonzella Lex, MD      . midazolam (VERSED) injection 2 mg  2 mg Intravenous Once Gonzella Lex, MD        Musculoskeletal: Strength & Muscle Tone: within normal limits  Gait & Station: normal Patient leans: N/A  Psychiatric Specialty Exam: Physical Exam  Nursing note and vitals reviewed. Constitutional: She appears well-developed and well-nourished.  HENT:  Head: Normocephalic and atraumatic.  Eyes: Conjunctivae are normal. Pupils are equal, round, and reactive to light.  Neck: Normal range of motion.  Cardiovascular: Regular rhythm and normal heart sounds.   Respiratory: Effort normal. No respiratory distress.  GI: Soft.  Musculoskeletal: Normal range of motion.   Neurological: She is alert.  Skin: Skin is warm and dry.  Psychiatric: Her speech is normal and behavior is normal. Her mood appears anxious. She expresses impulsivity. She expresses suicidal ideation. She exhibits abnormal recent memory.    Review of Systems  Constitutional: Negative.   HENT: Negative.   Eyes: Negative.   Respiratory: Negative.   Cardiovascular: Negative.   Gastrointestinal: Negative.   Musculoskeletal: Negative.   Skin: Negative.   Neurological: Negative.   Psychiatric/Behavioral: Positive for depression, hallucinations, memory loss and suicidal ideas. Negative for substance abuse. The patient is nervous/anxious and has insomnia.     Blood pressure (!) 120/59, pulse 98, temperature 97.8 F (36.6 C), resp. rate 16, height '5\' 4"'  (1.626 m), weight 111.6 kg (246 lb), SpO2 98 %.Body mass index is 42.23 kg/m.  General Appearance: Fairly Groomed  Eye Contact:  Good  Speech:  Clear and Coherent  Volume:  Normal  Mood:  Depressed  Affect:  Blunt  Thought Process:  Goal Directed  Orientation:  Full (Time, Place, and Person)  Thought Content:  Rumination  Suicidal Thoughts:  Yes.  with intent/plan  Homicidal Thoughts:  No  Memory:  Immediate;   Good Recent;   Fair Remote;   Fair  Judgement:  Good  Insight:  Good  Psychomotor Activity:  Decreased  Concentration:  Concentration: Fair  Recall:  [employer] of Knowledge:  Fair  Language:  Fair  Akathisia:  No  Handed:  Right  AIMS (if indicated):     Assets:  Communication Skills Desire for Improvement Financial Resources/Insurance Housing Physical Health Resilience Social Support  ADL's:  Intact  Cognition:  WNL  Sleep:        Treatment Plan Summary: Daily contact with patient to assess and evaluate symptoms and progress in treatment, Medication management and Plan Because of the intensity of her suicidal statements as well as her recent multiple episodes of noncompliance and dangerous behavior I filed  commitment papers on the patient prior to ECT. She had her ECT treatment this morning and tolerated that fine. She continues to be moody and depressed. She will be admitted to the psychiatry ward. I'm restarting her lithium as well as her Adderall. Patient has chronically been maintained on what I think is an excessively high dose of Xanax and I will cut that down to 1 mg 3 times a day instead of 2 mg 3 times a day. Continue trazodone. Engage patient in groups on the unit. Probably going through continue ECT the rest of this week as well to get some stabilization.  Disposition: Recommend psychiatric Inpatient admission when medically cleared. Supportive therapy provided about ongoing stressors.  Alethia Berthold, MD 05/03/2016 4:52 PM

## 2016-05-03 NOTE — ED Notes (Signed)
Pt  Seen  By   Dr  Toni Amend  Pending  Placement

## 2016-05-03 NOTE — Anesthesia Postprocedure Evaluation (Signed)
Anesthesia Post Note  Patient: Brittany [name]  Procedure(s) Performed: * No procedures listed *  Patient location during evaluation: PACU Anesthesia Type: General Level of consciousness: awake and alert Pain management: pain level controlled Vital Signs Assessment: post-procedure vital signs reviewed and stable Respiratory status: spontaneous breathing, nonlabored ventilation and respiratory function stable Cardiovascular status: blood pressure returned to baseline and stable Postop Assessment: no signs of nausea or vomiting Anesthetic complications: no    Last Vitals:  Vitals:   05/03/16 1120 05/03/16 1125  BP: 103/90   Pulse: 97 99  Resp: 18 18  Temp:      Last Pain:  Vitals:   05/03/16 1125  TempSrc:   PainSc: 4                  [name]

## 2016-05-03 NOTE — ED Notes (Signed)

## 2016-05-03 NOTE — H&P (Signed)
Brittany [name] is an 30 y.o. female.   Chief Complaint: Patient was severe depression as part of bipolar disorder HPI: Depressed labile recent hospitalization earlier in the month. Suicide attempt.  Past Medical History:  Diagnosis Date  . ADHD (attention deficit hyperactivity disorder)   . Anxiety   . Arthritis   . Bipolar disorder ([facility])   . Chronic kidney disease    Kidney stones  . Depression   . Headache     Past Surgical History:  Procedure Laterality Date  . CESAREAN SECTION    . FOOT SURGERY    . KIDNEY SURGERY    . NOVASURE ABLATION  2010  . SHOULDER SURGERY Right   . TUBAL LIGATION      Family History  Problem Relation Age of Onset  . Anxiety disorder Mother   . Depression Mother   . Drug abuse Mother   . Bipolar disorder Mother   . Depression Father   . Anxiety disorder Father   . Drug abuse Father   . Anxiety disorder Sister   . Depression Sister   . ADD / ADHD Sister   . Anxiety disorder Sister   . Depression Sister    Social History:  reports that she has quit smoking. Her smoking use included Cigarettes. She has never used smokeless tobacco. She reports that she drinks about 2.4 - 7.2 oz of alcohol per week . She reports that she uses drugs, including Marijuana.  Allergies:  Allergies  Allergen Reactions  . Ceclor [Cefaclor] Hives    "vomiting"     (Not in a hospital admission)  Results for orders placed or performed during the hospital encounter of 05/03/16 (from the past 48 hour(s))  Pregnancy, urine POC     Status: None   Collection Time: 05/03/16  8:50 AM  Result Value Ref Range   Preg Test, Ur NEGATIVE NEGATIVE    Comment:        THE SENSITIVITY OF THIS METHODOLOGY IS >24 mIU/mL   Lithium level     Status: Abnormal   Collection Time: 05/03/16  8:57 AM  Result Value Ref Range   Lithium Lvl <0.06 (L) 0.60 - 1.20 mmol/L  Urine Drug Screen, Qualitative ([facility] only)     Status: Abnormal   Collection Time: 05/03/16  8:57 AM  Result  Value Ref Range   Tricyclic, Ur Screen NONE DETECTED NONE DETECTED   Amphetamines, Ur Screen POSITIVE (A) NONE DETECTED   MDMA (Ecstasy)Ur Screen NONE DETECTED NONE DETECTED   Cocaine Metabolite,Ur [place] NONE DETECTED NONE DETECTED   Opiate, Ur Screen NONE DETECTED NONE DETECTED   Phencyclidine (PCP) Ur S NONE DETECTED NONE DETECTED   Cannabinoid 50 Ng, Ur [place] NONE DETECTED NONE DETECTED   Barbiturates, Ur Screen NONE DETECTED NONE DETECTED   Benzodiazepine, Ur Scrn POSITIVE (A) NONE DETECTED   Methadone Scn, Ur NONE DETECTED NONE DETECTED    Comment: (NOTE) 100  Tricyclics, urine               Cutoff 1000 ng/mL 200  Amphetamines, urine             Cutoff 1000 ng/mL 300  MDMA (Ecstasy), urine           Cutoff 500 ng/mL 400  Cocaine Metabolite, urine       Cutoff 300 ng/mL 500  Opiate, urine                   Cutoff 300 ng/mL 600  Phencyclidine (PCP),  urine      Cutoff 25 ng/mL 700  Cannabinoid, urine              Cutoff 50 ng/mL 800  Barbiturates, urine             Cutoff 200 ng/mL 900  Benzodiazepine, urine           Cutoff 200 ng/mL 1000 Methadone, urine                Cutoff 300 ng/mL 1100 1200 The urine drug screen provides only a preliminary, unconfirmed 1300 analytical test result and should not be used for non-medical 1400 purposes. Clinical consideration and professional judgment should 1500 be applied to any positive drug screen result due to possible 1600 interfering substances. A more specific alternate chemical method 1700 must be used in order to obtain a confirmed analytical result.  1800 Gas chromato graphy / mass spectrometry (GC/MS) is the preferred 1900 confirmatory method.    No results found.  Review of Systems  Constitutional: Negative.   HENT: Negative.   Eyes: Negative.   Respiratory: Negative.   Cardiovascular: Negative.   Gastrointestinal: Negative.   Musculoskeletal: Negative.   Skin: Negative.   Neurological: Negative.   Psychiatric/Behavioral:  Positive for depression, hallucinations, memory loss and suicidal ideas. Negative for substance abuse. The patient is nervous/anxious and has insomnia.     Blood pressure (!) 133/99, pulse (!) 106, temperature 98.4 F (36.9 C), temperature source Oral, resp. rate 18, height  (1.626 m), weight 111.6 kg (246 lb). Physical Exam  Nursing note and vitals reviewed. Constitutional: She appears well-developed and well-nourished.  HENT:  Head: Normocephalic and atraumatic.  Eyes: Conjunctivae are normal. Pupils are equal, round, and reactive to light.  Neck: Normal range of motion.  Cardiovascular: Regular rhythm and normal heart sounds.   Respiratory: Effort normal. No respiratory distress.  GI: Soft.  Musculoskeletal: Normal range of motion.  Neurological: She is alert.  Skin: Skin is warm and dry.  Psychiatric: Her mood appears anxious. Her affect is labile and inappropriate. Her speech is tangential. She is agitated. Thought content is paranoid. She expresses impulsivity. She exhibits a depressed mood. She expresses suicidal ideation.     Assessment/Plan Patient is complaining of worsening depression. Active suicidal thoughts. Labile mood and affect. ECT today followed by admission to the hospital.  Mordecai Rasmussen, MD 05/03/2016, 10:38 AM

## 2016-05-03 NOTE — ED Notes (Signed)
Patient given dinner tray. She called her husband on the phone. Currently in no acute distress. Maintained on 15 minute checks and observation by security camera for safety.

## 2016-05-03 NOTE — ED Triage Notes (Signed)
Was at ect today and told dr she was suicidal.  Says she has a plan to overdose and has done same a couple times

## 2016-05-03 NOTE — ED Notes (Signed)
Urine for pregnancy obtained - negative.

## 2016-05-03 NOTE — ED Notes (Signed)
Gave report to [employer]

## 2016-05-03 NOTE — Progress Notes (Signed)
Pt states she had attempted suicide on June 30th with overdose of several different medications.  Was admitted to [facility] for approx 4 days.  States they had advised her to be transferred to inpatient psychiatic facility.  Pt did not go as advised.

## 2016-05-03 NOTE — Transfer of Care (Signed)
Immediate Anesthesia Transfer of Care Note  Patient: Brittany [name]  Procedure(s) Performed: * No procedures listed *  Patient Location: PACU  Anesthesia Type:General  Level of Consciousness: awake  Airway & Oxygen Therapy: Patient Spontanous Breathing and Patient connected to face mask oxygen  Post-op Assessment: Report given to RN and Post -op Vital signs reviewed and stable  Post vital signs: Reviewed and stable  Last Vitals:  Vitals:   05/03/16 0858 05/03/16 1100  BP: (!) 133/99 100/82  Pulse: (!) 106 (!) 108  Resp: 18 20  Temp: 36.9 C 37.3 C    Last Pain:  Vitals:   05/03/16 0858  TempSrc: Oral         Complications: No apparent anesthesia complications

## 2016-05-04 ENCOUNTER — Other Ambulatory Visit: Payer: Self-pay | Admitting: Psychiatry

## 2016-05-04 DIAGNOSIS — F603 Borderline personality disorder: Secondary | ICD-10-CM

## 2016-05-04 DIAGNOSIS — F331 Major depressive disorder, recurrent, moderate: Principal | ICD-10-CM

## 2016-05-04 DIAGNOSIS — F1220 Cannabis dependence, uncomplicated: Secondary | ICD-10-CM

## 2016-05-04 LAB — LIPID PANEL
CHOL/HDL RATIO: 4.9 ratio
Cholesterol: 199 mg/dL (ref 0–200)
HDL: 41 mg/dL (ref >40)
LDL CALC: 115 mg/dL — AB (ref 0–99)
Triglycerides: 217 mg/dL — ABNORMAL HIGH (ref <150)
VLDL: 43 mg/dL — AB (ref 0–40)

## 2016-05-04 LAB — TSH: TSH: 2.234 u[IU]/mL (ref 0.350–4.500)

## 2016-05-04 LAB — HEMOGLOBIN A1C: Hgb A1c MFr Bld: 4.7 % (ref 4.0–6.0)

## 2016-05-04 MED ORDER — HALOPERIDOL LACTATE 5 MG/ML IJ SOLN: 5.0000 mg | Freq: Four times a day (QID) | INTRAMUSCULAR | Status: DC | PRN

## 2016-05-04 MED ORDER — CHLORDIAZEPOXIDE HCL 25 MG PO CAPS
75.0000 mg | ORAL_CAPSULE | Freq: Three times a day (TID) | ORAL | Status: DC
  Administered 2016-05-04 (×2): 75 mg via ORAL
  Filled 2016-05-04 (×2): qty 3

## 2016-05-04 MED ORDER — HALOPERIDOL 5 MG PO TABS
5.0000 mg | ORAL_TABLET | Freq: Four times a day (QID) | ORAL | Status: DC | PRN
  Administered 2016-05-04: 5 mg via ORAL
  Filled 2016-05-04: qty 1

## 2016-05-04 MED ORDER — LITHIUM CARBONATE ER 300 MG PO TBCR
300.0000 mg | EXTENDED_RELEASE_TABLET | Freq: Two times a day (BID) | ORAL | Status: DC
  Administered 2016-05-04 – 2016-05-07 (×7): 300 mg via ORAL
  Filled 2016-05-04 (×7): qty 1

## 2016-05-04 MED ORDER — DIPHENHYDRAMINE HCL 50 MG/ML IJ SOLN
50.0000 mg | Freq: Four times a day (QID) | INTRAMUSCULAR | Status: DC | PRN
Start: 2016-05-04 — End: 2016-05-04

## 2016-05-04 MED ORDER — TRAZODONE HCL 100 MG PO TABS
200.0000 mg | ORAL_TABLET | Freq: Every day | ORAL | Status: DC
  Administered 2016-05-04 – 2016-05-06 (×3): 200 mg via ORAL
  Filled 2016-05-04 (×4): qty 2

## 2016-05-04 MED ORDER — TRAZODONE HCL 100 MG PO TABS: 300.0000 mg | ORAL_TABLET | Freq: Every day | ORAL | Status: DC

## 2016-05-04 MED ORDER — DIPHENHYDRAMINE HCL 25 MG PO CAPS: 50.0000 mg | ORAL_CAPSULE | Freq: Four times a day (QID) | ORAL | Status: DC | PRN
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
## 2016-05-04 NOTE — Progress Notes (Signed)
Making threats  of acting out as xanax and adderall have been d/c.   Orders given for prn haldol and benadryl PO or IM

## 2016-05-04 NOTE — Plan of Care (Signed)
Problem: Coping: Goal: Ability to cope will improve Outcome: Not Progressing Patient not able to cope with stressors at this time as she has not learned coping mechanisms CTownsendRN

## 2016-05-04 NOTE — [facility] Suicide Risk Assessment (Signed)
[facility] INPATIENT:  Family/Significant Other Suicide Prevention Education  Suicide Prevention Education:  Education Completed; husband, Ariahna Hankerson, ph #[phone] has been identified by the patient as the family member/significant other with whom the patient will be residing, and identified as the person(s) who will aid the patient in the event of a mental health crisis (suicidal ideations/suicide attempt).  With written consent from the patient, the family member/significant other has been provided the following suicide prevention education, prior to the and/or following the discharge of the patient. Husband, Jomarie Longs states his lack of insight with wife's diagnoses. He states that he is pt only support at this time and thinks therapy for both individuals would be helpful.  The suicide prevention education provided includes the following:  Suicide risk factors  Suicide prevention and interventions  National Suicide Hotline telephone number  [facility] assessment telephone number  [facility] Emergency Assistance 911  [facility] and/or Residential Mobile Crisis Unit telephone number  Request made of family/significant other to:  Remove weapons (e.g., guns, rifles, knives), all items previously/currently identified as safety concern.    Remove drugs/medications (over-the-counter, prescriptions, illicit drugs), all items previously/currently identified as a safety concern.  The family member/significant other verbalizes understanding of the suicide prevention education information provided.  The family member/significant other agrees to remove the items of safety concern listed above.  Lynden Oxford, MSW, [name]-A 05/04/2016, 10:57 AM

## 2016-05-04 NOTE — [facility] Suicide Risk Assessment (Signed)
[facility] Admission Suicide Risk Assessment   Nursing information obtained from:  Patient Demographic factors:  Unemployed Current Mental Status:  NA Loss Factors:  Loss of significant relationship, Financial problems / change in socioeconomic status Historical Factors:  Prior suicide attempts Risk Reduction Factors:  Sense of responsibility to family  Total Time spent with patient: 1 hour Principal Problem: MDD (major depressive disorder), recurrent episode, moderate ([facility]) Diagnosis:   Patient Active Problem List   Diagnosis Date Noted  . MDD (major depressive disorder), recurrent episode, moderate ([facility]) [F33.1] 05/04/2016  . Borderline personality disorder [F60.3] 05/04/2016  . Cannabis use disorder, severe, dependence ([facility]) [F12.20] 05/04/2016  . Suicidal ideation [R45.851] 05/03/2016  . Noncompliance [Z91.19] 05/03/2016   Subjective Data: Patient has reported active suicidal ideation and had made a suicide attempt at the beginning of July. Mood has been labile and agitated and she has been noncompliant with outpatient treatment. Multiple serious risk factors for suicide  Continued Clinical Symptoms:  Alcohol Use Disorder Identification Test Final Score (AUDIT): 5 The "Alcohol Use Disorders Identification Test", Guidelines for Use in Primary Care, Second Edition.  World Science writer [facility]). Score between 0-7:  no or low risk or alcohol related problems. Score between 8-15:  moderate risk of alcohol related problems. Score between 16-19:  high risk of alcohol related problems. Score 20 or above:  warrants further diagnostic evaluation for alcohol dependence and treatment.   CLINICAL FACTORS:   Bipolar Disorder:   Mixed State Depression:   Anhedonia Impulsivity   Musculoskeletal: Strength & Muscle Tone: within normal limits Gait & Station: normal Patient leans: N/A  Psychiatric Specialty Exam: Physical Exam  ROS  Blood pressure 98/83, pulse (!) 102, temperature 98 F  (36.7 C), temperature source Oral, resp. rate 18, height  (1.626 m), weight 111.6 kg (246 lb), SpO2 99 %.Body mass index is 42.23 kg/m.  General Appearance: Casual  Eye Contact:  Fair  Speech:  Clear and Coherent  Volume:  Normal  Mood:  Anxious and Depressed  Affect:  Depressed  Thought Process:  Coherent  Orientation:  Full (Time, Place, and Person)  Thought Content:  Rumination  Suicidal Thoughts:  Yes.  with intent/plan  Homicidal Thoughts:  No  Memory:  Immediate;   Good Recent;   Fair Remote;   Fair  Judgement:  Impaired  Insight:  Shallow  Psychomotor Activity:  Psychomotor Retardation  Concentration:  Concentration: Poor  Recall:  Fair  Fund of Knowledge:  Fair  Language:  Fair  Akathisia:  No  Handed:  Right  AIMS (if indicated):     Assets:  Desire for Improvement Housing Physical Health Resilience  ADL's:  Intact  Cognition:  WNL  Sleep:  Number of Hours: 6.15      COGNITIVE FEATURES THAT CONTRIBUTE TO RISK:  Loss of executive function    SUICIDE RISK:   Severe:  Frequent, intense, and enduring suicidal ideation, specific plan, no subjective intent, but some objective markers of intent (i.e., choice of lethal method), the method is accessible, some limited preparatory behavior, evidence of impaired self-control, severe dysphoria/symptomatology, multiple risk factors present, and few if any protective factors, particularly a lack of social support.   PLAN OF CARE: Patient has been admitted to the psychiatry ward. Frequent observation. Involvement in groups and activities. Daily reevaluation of mood. ECT scheduled for tomorrow. Restarting lithium which had been helpful with her mood in the past.  I certify that inpatient services furnished can reasonably be expected to improve the patient's  condition.  Mordecai Rasmussen, MD 05/04/2016, 6:05 PM

## 2016-05-04 NOTE — Progress Notes (Signed)
D: Patient is a 30 y.o.  year-old female admitted to [facility] ambulatory without difficulty. Patient is alert and oriented upon admission. A: Admission assessment completed without difficulty. Skin and contraband assessment completed with Baird Lyons RN with no skin abnormalities nor contraband found. Q.15 minute safety checks were implemented at the time of admission. Patient was oriented to the unit and escorted to room   303                                                                  R: Patient was receptive to and cooperative with admission assessment. Patient contracts for safety on the unit at this time

## 2016-05-04 NOTE — Progress Notes (Signed)
D: Patient upset that she  Is note receiving her standing medications . Stated she has been on  Certain medication for 10 years .  Noted to get upset with MD. " Stated I'm not going to take a fucking thing " " I'm not going to groups or any thing until  I go home  Patient slept for a long period of time  This shift  Patient stated slept oorlast night .Stated appetite is good and energy level  Is normal. Stated concentration is good . Stated on Depression scale 2, hopeless 2 and anxiety 8.( low 0-10 high) Denies suicidal  homicidal ideations  .  No auditory hallucinations  No pain concerns .Limited  Interacting with peers and staff.  A: Encourage patient participation with unit programming . Instruction  Given on  Medication , verbalize understanding. R: Voice no other concerns. Staff continue to monitor

## 2016-05-04 NOTE — Tx Team (Signed)
Initial Interdisciplinary Treatment Plan   PATIENT STRESSORS: Financial Spouse relationship stress   PATIENT STRENGTHS: Willingness to learn coping skills Desire to have the correct medications for her depression   PROBLEM LIST: Problem List/Patient Goals Date to be addressed Date deferred Reason deferred Estimated date of resolution  depression 05/04/16   05/10/16  SI without a plan  05/04/16   05/10/16  anxiety 05/04/16   05/10/16  Panic attacks 05/04/16   05/10/16        "anxiety controlled"      'depression controlled"                   DISCHARGE CRITERIA:  Patient to have anxiety and depression controlled with medications and ect treatments.Patient to have no thoughts of suicide. Patient to utilize learned coping mechanisms prior to discharge home   PRELIMINARY DISCHARGE PLAN: Attend aftercare/continuing care group Participate in family therapy Return to previous living arrangement  PATIENT/FAMIILY INVOLVEMENT: This treatment plan has been presented to and reviewed with the patient, Bambie Mcrae, and/or family member,    The patient and family have been given the opportunity to ask questions and make suggestions.  MARCELLIA LORAH 05/04/2016, 5:07 AM
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
## 2016-05-04 NOTE — H&P (Signed)
Psychiatric Admission Assessment Adult  Patient Identification: Aviva Attanasio MRN:  [id number] Date of Evaluation:  05/04/2016 Chief Complaint:  Bipolar Disorder Principal Diagnosis: MDD (major depressive disorder), recurrent episode, moderate (HCC) Diagnosis:   Patient Active Problem List   Diagnosis Date Noted  . MDD (major depressive disorder), recurrent episode, moderate (HCC) [F33.1] 05/04/2016  . Borderline personality disorder [F60.3] 05/04/2016  . Cannabis use disorder, severe, dependence (HCC) [F12.20] 05/04/2016  . Suicidal ideation [R45.851] 05/03/2016  . Noncompliance [Z91.19] 05/03/2016   History of Present Illness: 30 year old woman with a long-standing history of mood instability with labile mood depression anger rapid cycling bipolar personality disorder features. Presented to ECT on Monday reporting active suicidal ideation. Patient received an ECT treatment Monday and was then admitted to the hospital which she had already discussed with me prior to treatment. Mood recently has been depressed. She had a hospital stay in [facility] early in July and was supposed to go for psychiatric treatment afterwards but did not follow-up. She has been off of psychiatric medicine except for her Xanax and probably her Adderall. Continues to have major stress in her relationship at home. Associated Signs/Symptoms: Depression Symptoms:  depressed mood, insomnia, psychomotor agitation, fatigue, feelings of worthlessness/guilt, difficulty concentrating, hopelessness, recurrent thoughts of death, suicidal thoughts with specific plan, (Hypo) Manic Symptoms:  Distractibility, Impulsivity, Anxiety Symptoms:  Panic Symptoms, Obsessive Compulsive Symptoms:   Checking,, Psychotic Symptoms:  Paranoia, PTSD Symptoms: Negative Total Time spent with patient: 1 hour  Past Psychiatric History: Long history of mental health problems. Variously diagnosed as bipolar depressed PTSD and  personality disordered. Several prior hospitalizations. Had had some stability when getting maintenance ECT but has had recurrent current compliance problems with both medicine and other outpatient treatment. Several prior suicide attempts.  Is the patient at risk to self? Yes.    Has the patient been a risk to self in the past 6 months? Yes.    Has the patient been a risk to self within the distant past? Yes.    Is the patient a risk to others? No.  Has the patient been a risk to others in the past 6 months? No.  Has the patient been a risk to others within the distant past? Yes.     Prior Inpatient Therapy:   Prior Outpatient Therapy:    Alcohol Screening: Patient refused Alcohol Screening Tool: Yes 1. How often do you have a drink containing alcohol?: 4 or more times a week 2. How many drinks containing alcohol do you have on a typical day when you are drinking?: 3 or 4 3. How often do you have six or more drinks on one occasion?: Never Preliminary Score: 1 4. How often during the last year have you found that you were not able to stop drinking once you had started?: Never 5. How often during the last year have you failed to do what was normally expected from you becasue of drinking?: Never 6. How often during the last year have you needed a first drink in the morning to get yourself going after a heavy drinking session?: Never 8. How often during the last year have you been unable to remember what happened the night before because you had been drinking?: Never 9. Have you or someone else been injured as a result of your drinking?: No 10. Has a relative or friend or a doctor or another health worker been concerned about your drinking or suggested you cut down?: No Alcohol Use Disorder Identification Test Final  Score (AUDIT): 5 Brief Intervention: Yes Substance Abuse History in the last 12 months:  Yes.   Consequences of Substance Abuse: Negative Previous Psychotropic Medications: Yes   Psychological Evaluations: Yes  Past Medical History:  Past Medical History:  Diagnosis Date  . ADHD (attention deficit hyperactivity disorder)   . Anxiety   . Arthritis   . Bipolar disorder (HCC)   . Bipolar I disorder, most recent episode depressed (HCC)   . Chronic kidney disease    Kidney stones  . Depression   . Headache     Past Surgical History:  Procedure Laterality Date  . CESAREAN SECTION    . FOOT SURGERY    . KIDNEY SURGERY    . NOVASURE ABLATION  2010  . SHOULDER SURGERY Right   . TUBAL LIGATION     Family History:  Family History  Problem Relation Age of Onset  . Anxiety disorder Mother   . Depression Mother   . Drug abuse Mother   . Bipolar disorder Mother   . Depression Father   . Anxiety disorder Father   . Drug abuse Father   . Anxiety disorder Sister   . Depression Sister   . ADD / ADHD Sister   . Anxiety disorder Sister   . Depression Sister    Family Psychiatric  History: Patient believes there is some anxiety problem in her family but doesn't know much detail Tobacco Screening: [phone]::[phone])@ Social History:  History  Alcohol Use  . 2.4 - 7.2 oz/week  . 4 Glasses of wine per week     History  Drug Use  . Types: Marijuana    Comment: last used about 3 weeks ago    Additional Social History: Marital status: Married Number of Years Married: 1 What types of issues is patient dealing with in the relationship?: Husband does not understand diagnoses  Are you sexually active?: Yes What is your sexual orientation?: Straight  Has your sexual activity been affected by drugs, alcohol, medication, or emotional stress?: Yes - emotional stress and pt thinks diagnosis has affected sexual activity  Does patient have children?: Yes How many children?: 1 How is patient's relationship with their children?: Great relationship with son, "I am his biggest son"     History of alcohol / drug use?: Yes Longest period of sobriety (when/how long): 3  weeks Negative Consequences of Use: Financial, Personal relationships Withdrawal Symptoms: Agitation Name of Substance 1: marijuana 1 - Age of First Use: 17 1 - Amount (size/oz): 1 bowl 1 - Frequency: daily 1 - Last Use / Amount: 04/26/16                  Allergies:   Allergies  Allergen Reactions  . Ceclor [Cefaclor] Hives and Nausea And Vomiting    "vomiting"   Lab Results:  Results for orders placed or performed during the hospital encounter of 05/03/16 (from the past 48 hour(s))  Hemoglobin A1c     Status: None   Collection Time: 05/04/16  6:49 AM  Result Value Ref Range   Hgb A1c MFr Bld 4.7 4.0 - 6.0 %  Lipid panel     Status: Abnormal   Collection Time: 05/04/16  6:49 AM  Result Value Ref Range   Cholesterol 199 0 - 200 mg/dL   Triglycerides 409 (H) <150 mg/dL   HDL 41 >81 mg/dL   Total CHOL/HDL Ratio 4.9 RATIO   VLDL 43 (H) 0 - 40 mg/dL   LDL Cholesterol 191 (H) 0 -  99 mg/dL    Comment:        Total Cholesterol/HDL:CHD Risk Coronary Heart Disease Risk Table                     Men   Women  1/2 Average Risk   3.4   3.3  Average Risk       5.0   4.4  2 X Average Risk   9.6   7.1  3 X Average Risk  23.4   11.0        Use the calculated Patient Ratio above and the CHD Risk Table to determine the patient's CHD Risk.        ATP III CLASSIFICATION (LDL):  <100     mg/dL   Optimal  161-096  mg/dL   Near or Above                    Optimal  130-159  mg/dL   Borderline  045-409  mg/dL   High  >811     mg/dL   Very High   TSH     Status: None   Collection Time: 05/04/16  6:49 AM  Result Value Ref Range   TSH 2.234 0.350 - 4.500 uIU/mL    Blood Alcohol level:  Lab Results  Component Value Date   ETH <5 05/03/2016    Metabolic Disorder Labs:  Lab Results  Component Value Date   HGBA1C 4.7 05/04/2016   No results found for: PROLACTIN Lab Results  Component Value Date   CHOL 199 05/04/2016   TRIG 217 (H) 05/04/2016   HDL 41 05/04/2016    CHOLHDL 4.9 05/04/2016   VLDL 43 (H) 05/04/2016   LDLCALC 115 (H) 05/04/2016    Current Medications: Current Facility-Administered Medications  Medication Dose Route Frequency Provider Last Rate Last Dose  . acetaminophen (TYLENOL) tablet 650 mg  650 mg Oral Q6H PRN Audery Amel, MD      . alum & mag hydroxide-simeth (MAALOX/MYLANTA) 200-200-20 MG/5ML suspension 30 mL  30 mL Oral Q4H PRN Audery Amel, MD      . haloperidol (HALDOL) tablet 5 mg  5 mg Oral Q6H PRN Jimmy Footman, MD       Or  . haloperidol lactate (HALDOL) injection 5 mg  5 mg Intramuscular Q6H PRN Jimmy Footman, MD      . lithium carbonate (LITHOBID) CR tablet 300 mg  300 mg Oral Q12H Jimmy Footman, MD   300 mg at 05/04/16 1019  . magnesium hydroxide (MILK OF MAGNESIA) suspension 30 mL  30 mL Oral Daily PRN Audery Amel, MD      . traZODone (DESYREL) tablet 200 mg  200 mg Oral QHS Jimmy Footman, MD       Facility-Administered Medications Ordered in Other Encounters  Medication Dose Route Frequency Provider Last Rate Last Dose  . 0.9 %  sodium chloride infusion  250 mL Intravenous Once Audery Amel, MD      . ketorolac (TORADOL) 30 MG/ML injection 30 mg  30 mg Intravenous Once Audery Amel, MD      . midazolam (VERSED) injection 2 mg  2 mg Intravenous Once Audery Amel, MD       PTA Medications: Prescriptions Prior to Admission  Medication Sig Dispense Refill Last Dose  . alprazolam (XANAX) 2 MG tablet Take 2 mg by mouth 3 (three) times daily as needed for sleep.   unknown at unknown  .  amphetamine-dextroamphetamine (ADDERALL) 30 MG tablet Take 30 mg by mouth daily.   unknown at unknown  . doxylamine, Sleep, (UNISOM) 25 MG tablet Take 25 mg by mouth at bedtime as needed for sleep. Reported on 02/16/2016   unknown at unknown  . lithium 300 MG tablet Take 300 mg by mouth every morning. Reported on 12/15/2015   unknown at unknown  . lithium carbonate 300 MG capsule Take  300 mg by mouth at bedtime.   unknown at unknown  . trazodone (DESYREL) 300 MG tablet Take 600 mg by mouth at bedtime.    unknown at unknown    Musculoskeletal: Strength & Muscle Tone: within normal limits Gait & Station: normal Patient leans: N/A  Psychiatric Specialty Exam: Physical Exam  Constitutional: She appears well-developed and well-nourished.  HENT:  Head: Normocephalic and atraumatic.  Eyes: Conjunctivae are normal. Pupils are equal, round, and reactive to light.  Neck: Normal range of motion.  Cardiovascular: Normal heart sounds.   Respiratory: Effort normal.  GI: Soft.  Musculoskeletal: Normal range of motion.  Neurological: She is alert.  Skin: Skin is warm and dry.  Psychiatric: Her speech is delayed. She is slowed. Cognition and memory are normal. She expresses impulsivity. She exhibits a depressed mood. She expresses suicidal ideation.    Review of Systems  Constitutional: Negative.   HENT: Negative.   Eyes: Negative.   Respiratory: Negative.   Cardiovascular: Negative.   Gastrointestinal: Negative.   Musculoskeletal: Negative.   Skin: Negative.   Neurological: Negative.   Psychiatric/Behavioral: Positive for depression, hallucinations and suicidal ideas. The patient is nervous/anxious and has insomnia.     Blood pressure 98/83, pulse (!) 102, temperature 98 F (36.7 C), temperature source Oral, resp. rate 18, height 5\' 4"  (1.626 m), weight 111.6 kg (246 lb), SpO2 99 %.Body mass index is 42.23 kg/m.  General Appearance: Casual  Eye Contact:  Fair  Speech:  Slow  Volume:  Decreased  Mood:  Anxious and Depressed  Affect:  Congruent  Thought Process:  Goal Directed  Orientation:  Full (Time, Place, and Person)  Thought Content:  Rumination  Suicidal Thoughts:  Yes.  with intent/plan  Homicidal Thoughts:  No  Memory:  Immediate;   Good Recent;   Fair Remote;   Fair  Judgement:  Impaired  Insight:  Shallow  Psychomotor Activity:  Decreased   Concentration:  Concentration: Fair  Recall:  [employer] of Knowledge:  Fair  Language:  Fair  Akathisia:  No  Handed:  Right  AIMS (if indicated):     Assets:  Communication Skills Desire for Improvement Housing Resilience  ADL's:  Intact  Cognition:  WNL  Sleep:  Number of Hours: 6.15       Treatment Plan Summary: Daily contact with patient to assess and evaluate symptoms and progress in treatment, Medication management and Plan Patient admitted to the hospital for stabilization and because of recent worsening of her mood. Patient was seen by another psychiatric team earlier today and some changes were made to her medicine. The patient had been prescribed alprazolam and Adderall by her primary care doctor but had been off her lithium recently. I am ambivalent about the utility of her Adderall but I agree that the Xanax is unnecessary. I had cut her down and yesterday took only 1 mg 3 times a day. Today that was discontinued and she was put on Librium which was clearly over sedating her today. I discontinued all of that and will rediscuss it tomorrow. Plan  is for another ECT treatment tomorrow and then reevaluation. We may be able to get her discharge soon but she will definitely need a solid outpatient plan once she leaves. She has been restarted on her lithium which had been helpful previously  Observation Level/Precautions:  15 minute checks  Laboratory:  UDS  Psychotherapy:  Daily individual and group therapy   Medications:  Medications as detailed above   Consultations:  None for now   Discharge Concerns:  Does not currently have an outpatient psychiatric provider   Estimated LOS:2-3 days   Other:     I certify that inpatient services furnished can reasonably be expected to improve the patient's condition.    Mordecai Rasmussen, MD 7/25/20175:58 PM

## 2016-05-05 ENCOUNTER — Inpatient Hospital Stay: Payer: Medicaid Other | Admitting: Certified Registered Nurse Anesthetist

## 2016-05-05 LAB — PROLACTIN: PROLACTIN: 27.7 ng/mL — AB (ref 4.8–23.3)

## 2016-05-05 MED ORDER — ALPRAZOLAM 1 MG PO TABS
1.0000 mg | ORAL_TABLET | Freq: Three times a day (TID) | ORAL | Status: DC
  Administered 2016-05-05 – 2016-05-07 (×6): 1 mg via ORAL
  Filled 2016-05-05 (×6): qty 1

## 2016-05-05 MED ORDER — AMPHETAMINE-DEXTROAMPHETAMINE 5 MG PO TABS
15.0000 mg | ORAL_TABLET | Freq: Every day | ORAL | Status: DC
  Administered 2016-05-06 – 2016-05-07 (×2): 15 mg via ORAL
  Filled 2016-05-05 (×2): qty 3

## 2016-05-05 MED ORDER — AMPHETAMINE-DEXTROAMPHETAMINE 5 MG PO TABS
30.0000 mg | ORAL_TABLET | Freq: Every day | ORAL | Status: DC
  Administered 2016-05-06: 30 mg via ORAL
  Filled 2016-05-05: qty 6
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
## 2016-05-05 NOTE — Progress Notes (Signed)
D:  Patient has been appropriate in her interactions with staff and peers.  Patient states anxiety has decreased. A:  Medications were given with education.  Patient encouraged to continue using coping skills. R:  Patient was compliant medication and safety was maintained with 15 minute checks.

## 2016-05-05 NOTE — Plan of Care (Signed)
Problem: Safety: Goal: Periods of time without injury will increase Outcome: Progressing Patient has remained free from injury this shift.  Patient has been compliant with safety rules of the unit.  Will continue to monitor.

## 2016-05-05 NOTE — Plan of Care (Signed)
Problem: Activity: Goal: Interest or engagement in leisure activities will improve Outcome: Not Met (add Reason) Calm and cooperative. Bright affect. Interacting with peers and staff. Attend group. Med compliant. Maalox 30 ml given PRN for indigestion.  S/e and adverse reaction discussed. Questions encouraged. No voiced thoughts of hurting herself. No behavior issues noted. q 15 min checks maintained for safety will continue to monitor behavior.

## 2016-05-05 NOTE — Progress Notes (Signed)
D: Patient has been very labile this evening. She was making threats towards the doctor who D/C'd her xanax stating somebody better be in the room with her during the assessment if she makes her as mad as she did yesterday. She has been hostile towards staff. She had to be redirected multiple times. Patient made statements such as, "Anybody can kill themselves here if they really wanted to. I could kill myself with these paper scrubs."  A: Medications given with education. PRN Haldol given. Encouragement provided.  R: Patient compliant with medication. Safety maintained with 15 min checks.

## 2016-05-05 NOTE — [facility] Group Notes (Signed)
[facility] [name] Group Therapy   05/05/2016  9:30am  Type of Therapy: Group Therapy   Participation Level: Did Not Attend. Patient invited to participate but declined.    Reginold Beale F. Jerimyah Vandunk, MSW, LCSWA, LCAS     

## 2016-05-05 NOTE — Plan of Care (Signed)
Problem: Safety: Goal: Periods of time without injury will increase Outcome: Progressing Patient has been without injury during this shift.    

## 2016-05-05 NOTE — Progress Notes (Signed)
[facility] MD Progress Note  05/05/2016 5:08 PM Brittany [name]  MRN:  [id number] Subjective:  "I'm just really anxious" patient seen. Treatment team met as well. This is a follow-up note for this 30 year old woman who is a maintenance ECT patient and was admitted to the hospital Monday after ECT. Patient today says she is feeling very nervous and anxious. Mood is still labile. Denies however having any current suicidal or homicidal intentions or plans. Denies current psychotic symptoms. Patient has been fidgety and difficulty sleeping last night. She has particular complaints about her current medication that she is taking. Principal Problem: MDD (major depressive disorder), recurrent episode, moderate ([place]) Diagnosis:   Patient Active Problem List   Diagnosis Date Noted  . MDD (major depressive disorder), recurrent episode, moderate ([place]) [F33.1] 05/04/2016  . Borderline personality disorder [F60.3] 05/04/2016  . Cannabis use disorder, severe, dependence ([place]) [F12.20] 05/04/2016  . Suicidal ideation [R45.851] 05/03/2016  . Noncompliance [Z91.19] 05/03/2016   Total Time spent with patient: 30 minutes  Past Psychiatric History: Long-standing mood problems with multiple hospitalizations and suicide attempts in the past. Recent hospitalization at [facility] in the beginning of July. Tends to not follow-up regularly with outpatient treatment as recommended. Has had some benefit from ECT on top of her medicine.  Past Medical History:  Past Medical History:  Diagnosis Date  . ADHD (attention deficit hyperactivity disorder)   . Anxiety   . Arthritis   . Bipolar disorder ([place])   . Bipolar I disorder, most recent episode depressed ([place])   . Chronic kidney disease    Kidney stones  . Depression   . Headache     Past Surgical History:  Procedure Laterality Date  . CESAREAN SECTION    . FOOT SURGERY    . KIDNEY SURGERY    . [place]  2010  . SHOULDER SURGERY Right   . TUBAL  LIGATION     Family History:  Family History  Problem Relation Age of Onset  . Anxiety disorder Mother   . Depression Mother   . Drug abuse Mother   . Bipolar disorder Mother   . Depression Father   . Anxiety disorder Father   . Drug abuse Father   . Anxiety disorder Sister   . Depression Sister   . ADD / ADHD Sister   . Anxiety disorder Sister   . Depression Sister    Family Psychiatric  History: Family history positive for mood symptoms Social History:  History  Alcohol Use  . 2.4 - 7.2 oz/week  . 4 Glasses of wine per week     History  Drug Use  . Types: Marijuana    Comment: last used about 3 weeks ago    Social History   Social History  . Marital status: Married    Spouse name: N/A  . Number of children: N/A  . Years of education: N/A   Social History Main Topics  . Smoking status: Former Smoker    Types: Cigarettes  . Smokeless tobacco: Never Used  . Alcohol use 2.4 - 7.2 oz/week    4 Glasses of wine per week  . Drug use:     Types: Marijuana     Comment: last used about 3 weeks ago  . Sexual activity: Yes    Birth control/ protection: None   Other Topics Concern  . None   Social History Narrative  . None   Additional Social History:    History of alcohol / drug use?:  Yes Longest period of sobriety (when/how long): 3 weeks Negative Consequences of Use: Financial, Personal relationships Withdrawal Symptoms: Agitation Name of Substance 1: marijuana 1 - Age of First Use: 17 1 - Amount (size/oz): 1 bowl 1 - Frequency: daily 1 - Last Use / Amount: 04/26/16                  Sleep: Fair  Appetite:  Good  Current Medications: Current Facility-Administered Medications  Medication Dose Route Frequency Provider Last Rate Last Dose  . acetaminophen (TYLENOL) tablet 650 mg  650 mg Oral Q6H PRN Gonzella Lex, MD   650 mg at 05/04/16 2147  . ALPRAZolam Duanne Moron) tablet 1 mg  1 mg Oral TID Gonzella Lex, MD   1 mg at 05/05/16 1625  . alum &  mag hydroxide-simeth (MAALOX/MYLANTA) 200-200-20 MG/5ML suspension 30 mL  30 mL Oral Q4H PRN Gonzella Lex, MD   30 mL at 05/04/16 2147  . [START ON 05/06/2016] amphetamine-dextroamphetamine (ADDERALL) tablet 15 mg  15 mg Oral QPC lunch Gonzella Lex, MD      . Derrill Memo ON 05/06/2016] amphetamine-dextroamphetamine (ADDERALL) tablet 30 mg  30 mg Oral Q breakfast Gonzella Lex, MD      . haloperidol (HALDOL) tablet 5 mg  5 mg Oral Q6H PRN Hildred Priest, MD   5 mg at 05/04/16 2142   Or  . haloperidol lactate (HALDOL) injection 5 mg  5 mg Intramuscular Q6H PRN Hildred Priest, MD      . lithium carbonate (LITHOBID) CR tablet 300 mg  300 mg Oral Q12H Hildred Priest, MD   300 mg at 05/05/16 0911  . magnesium hydroxide (MILK OF MAGNESIA) suspension 30 mL  30 mL Oral Daily PRN Gonzella Lex, MD      . traZODone (DESYREL) tablet 200 mg  200 mg Oral QHS Hildred Priest, MD   200 mg at 05/04/16 2128    Lab Results:  Results for orders placed or performed during the hospital encounter of 05/03/16 (from the past 48 hour(s))  Hemoglobin A1c     Status: None   Collection Time: 05/04/16  6:49 AM  Result Value Ref Range   Hgb A1c MFr Bld 4.7 4.0 - 6.0 %  Lipid panel     Status: Abnormal   Collection Time: 05/04/16  6:49 AM  Result Value Ref Range   Cholesterol 199 0 - 200 mg/dL   Triglycerides 217 (H) <150 mg/dL   HDL 41 >40 mg/dL   Total CHOL/HDL Ratio 4.9 RATIO   VLDL 43 (H) 0 - 40 mg/dL   LDL Cholesterol 115 (H) 0 - 99 mg/dL    Comment:        Total Cholesterol/HDL:CHD Risk Coronary Heart Disease Risk Table                     Men   Women  1/2 Average Risk   3.4   3.3  Average Risk       5.0   4.4  2 X Average Risk   9.6   7.1  3 X Average Risk  23.4   11.0        Use the calculated Patient Ratio above and the CHD Risk Table to determine the patient's CHD Risk.        ATP III CLASSIFICATION (LDL):  <100     mg/dL   Optimal  100-129  mg/dL   Near  or Above  Optimal  130-159  mg/dL   Borderline  160-189  mg/dL   High  >190     mg/dL   Very High   Prolactin     Status: Abnormal   Collection Time: 05/04/16  6:49 AM  Result Value Ref Range   Prolactin 27.7 (H) 4.8 - 23.3 ng/mL    Comment: (NOTE) Performed At: [facility] [place], [place] [id number] Lindon Romp MD [id number]   TSH     Status: None   Collection Time: 05/04/16  6:49 AM  Result Value Ref Range   TSH 2.234 0.350 - 4.500 uIU/mL    Blood Alcohol level:  Lab Results  Component Value Date   ETH <5 35/32/9924    Metabolic Disorder Labs: Lab Results  Component Value Date   HGBA1C 4.7 05/04/2016   Lab Results  Component Value Date   PROLACTIN 27.7 (H) 05/04/2016   Lab Results  Component Value Date   CHOL 199 05/04/2016   TRIG 217 (H) 05/04/2016   HDL 41 05/04/2016   CHOLHDL 4.9 05/04/2016   VLDL 43 (H) 05/04/2016   LDLCALC 115 (H) 05/04/2016    Physical Findings: AIMS:  , ,  ,  ,    CIWA:  CIWA-Ar Total: 6 COWS:     Musculoskeletal: Strength & Muscle Tone: within normal limits Gait & Station: normal Patient leans: N/A  Psychiatric Specialty Exam: Physical Exam  Nursing note and vitals reviewed. Constitutional: She appears well-developed and well-nourished.  HENT:  Head: Normocephalic and atraumatic.  Eyes: Conjunctivae are normal. Pupils are equal, round, and reactive to light.  Neck: Normal range of motion.  Cardiovascular: Regular rhythm and normal heart sounds.   Respiratory: Effort normal. No respiratory distress.  GI: Soft.  Musculoskeletal: Normal range of motion.  Neurological: She is alert.  Skin: Skin is warm and dry.  Psychiatric: Her speech is normal and behavior is normal. Thought content normal. Her mood appears anxious. She expresses impulsivity. She expresses no suicidal ideation. She exhibits abnormal recent memory.    Review of Systems  Constitutional: Negative.   HENT:  Negative.   Eyes: Negative.   Respiratory: Negative.   Cardiovascular: Negative.   Gastrointestinal: Negative.   Musculoskeletal: Negative.   Skin: Negative.   Neurological: Negative.   Psychiatric/Behavioral: Positive for depression and memory loss. Negative for hallucinations, substance abuse and suicidal ideas. The patient is nervous/anxious and has insomnia.     Blood pressure 101/62, pulse 79, temperature 97.8 F (36.6 C), temperature source Oral, resp. rate 18, height '5\' 4"'  (1.626 m), weight 111.6 kg (246 lb), SpO2 99 %.Body mass index is 42.23 kg/m.  General Appearance: Casual  Eye Contact:  Fair  Speech:  Clear and Coherent  Volume:  Normal  Mood:  Anxious  Affect:  Blunt  Thought Process:  Goal Directed  Orientation:  Full (Time, Place, and Person)  Thought Content:  Logical and Rumination  Suicidal Thoughts:  No  Homicidal Thoughts:  No  Memory:  Immediate;   Good Recent;   Fair Remote;   Fair  Judgement:  Impaired  Insight:  Shallow  Psychomotor Activity:  Decreased  Concentration:  Concentration: Fair  Recall:  [employer] of Knowledge:  Fair  Language:  Fair  Akathisia:  No  Handed:  Right  AIMS (if indicated):     Assets:  Communication Skills Desire for Improvement Financial Resources/Insurance Housing Physical Health Resilience Social Support  ADL's:  Intact  Cognition:  WNL  Sleep:  Number of Hours: 7.5     Treatment Plan Summary: Daily contact with patient to assess and evaluate symptoms and progress in treatment, Medication management and Plan As far as her depression and mood instability the patient is calming down a little bit. Tolerating lithium okay. Denies any current suicidal ideation. She is still extremely anxious and upset about her medications having been changed. We discussed the need for her to have appropriate outpatient treatment and she points out correctly that her outpatient primary care doctor had been prescribing her medicine. I  negotiated with her that we try a slightly lower dose of Xanax 1 mg 3 times a day instead of 2 mg and I will restart the Adderall. I don't have any evidence that she has been abusing either of those. I do have evidence that she's been getting them prescribe regularly. Continue the current lithium. Next ECT scheduled for Friday after which I'm anticipating a likely discharge. No other change to medication. Supportive counseling and encouraged her to attend groups.  Alethia Berthold, MD 05/05/2016, 5:08 PM

## 2016-05-06 ENCOUNTER — Other Ambulatory Visit: Payer: Self-pay | Admitting: Psychiatry

## 2016-05-06 MED ORDER — BACITRACIN-NEOMYCIN-POLYMYXIN 400-5-5000 EX OINT
TOPICAL_OINTMENT | Freq: Two times a day (BID) | CUTANEOUS | Status: DC
  Administered 2016-05-06: 1 via TOPICAL
  Filled 2016-05-06 (×2): qty 1

## 2016-05-06 NOTE — Progress Notes (Signed)
[facility] MD Progress Note  05/06/2016 7:20 PM Brittany [name]  MRN:  [id number] Subjective:  Follow-up for Thursday the 25th. 30 year old woman with bipolar disorder. Patient states that her mood is better today. She has been feeling more stable throughout the day. She is not having any suicidal thoughts or psychosis today. She has been able to focus more appropriately on longer term treatment goals. Tolerating medicines well. No sign of oversedation or overstimulation. Principal Problem: MDD (major depressive disorder), recurrent episode, moderate (HCC) Diagnosis:   Patient Active Problem List   Diagnosis Date Noted  . MDD (major depressive disorder), recurrent episode, moderate (HCC) [F33.1] 05/04/2016  . Borderline personality disorder [F60.3] 05/04/2016  . Cannabis use disorder, severe, dependence (HCC) [F12.20] 05/04/2016  . Suicidal ideation [R45.851] 05/03/2016  . Noncompliance [Z91.19] 05/03/2016   Total Time spent with patient: 30 minutes  Past Psychiatric History: Long-standing mood problems with multiple hospitalizations and suicide attempts in the past. Recent hospitalization at [facility] in the beginning of July. Tends to not follow-up regularly with outpatient treatment as recommended. Has had some benefit from ECT on top of her medicine.  Past Medical History:  Past Medical History:  Diagnosis Date  . ADHD (attention deficit hyperactivity disorder)   . Anxiety   . Arthritis   . Bipolar disorder (HCC)   . Bipolar I disorder, most recent episode depressed (HCC)   . Chronic kidney disease    Kidney stones  . Depression   . Headache     Past Surgical History:  Procedure Laterality Date  . CESAREAN SECTION    . FOOT SURGERY    . KIDNEY SURGERY    . NOVASURE ABLATION  2010  . SHOULDER SURGERY Right   . TUBAL LIGATION     Family History:  Family History  Problem Relation Age of Onset  . Anxiety disorder Mother   . Depression Mother   . Drug abuse Mother   .  Bipolar disorder Mother   . Depression Father   . Anxiety disorder Father   . Drug abuse Father   . Anxiety disorder Sister   . Depression Sister   . ADD / ADHD Sister   . Anxiety disorder Sister   . Depression Sister    Family Psychiatric  History: Family history positive for mood symptoms Social History:  History  Alcohol Use  . 2.4 - 7.2 oz/week  . 4 Glasses of wine per week     History  Drug Use  . Types: Marijuana    Comment: last used about 3 weeks ago    Social History   Social History  . Marital status: Married    Spouse name: N/A  . Number of children: N/A  . Years of education: N/A   Social History Main Topics  . Smoking status: Former Smoker    Types: Cigarettes  . Smokeless tobacco: Never Used  . Alcohol use 2.4 - 7.2 oz/week    4 Glasses of wine per week  . Drug use:     Types: Marijuana     Comment: last used about 3 weeks ago  . Sexual activity: Yes    Birth control/ protection: None   Other Topics Concern  . None   Social History Narrative  . None   Additional Social History:    History of alcohol / drug use?: Yes Longest period of sobriety (when/how long): 3 weeks Negative Consequences of Use: Financial, Personal relationships Withdrawal Symptoms: Agitation Name of Substance 1: marijuana 1 -  Age of First Use: 17 1 - Amount (size/oz): 1 bowl 1 - Frequency: daily 1 - Last Use / Amount: 04/26/16                  Sleep: Fair  Appetite:  Good  Current Medications: Current Facility-Administered Medications  Medication Dose Route Frequency Provider Last Rate Last Dose  . acetaminophen (TYLENOL) tablet 650 mg  650 mg Oral Q6H PRN Audery Amel, MD   650 mg at 05/04/16 2147  . ALPRAZolam Prudy Feeler) tablet 1 mg  1 mg Oral TID Audery Amel, MD   1 mg at 05/06/16 1554  . alum & mag hydroxide-simeth (MAALOX/MYLANTA) 200-200-20 MG/5ML suspension 30 mL  30 mL Oral Q4H PRN Audery Amel, MD   30 mL at 05/05/16 2146  .  amphetamine-dextroamphetamine (ADDERALL) tablet 15 mg  15 mg Oral QPC lunch Audery Amel, MD   15 mg at 05/06/16 1233  . amphetamine-dextroamphetamine (ADDERALL) tablet 30 mg  30 mg Oral Q breakfast Audery Amel, MD   30 mg at 05/06/16 0839  . haloperidol (HALDOL) tablet 5 mg  5 mg Oral Q6H PRN Jimmy Footman, MD   5 mg at 05/04/16 2142   Or  . haloperidol lactate (HALDOL) injection 5 mg  5 mg Intramuscular Q6H PRN Jimmy Footman, MD      . lithium carbonate (LITHOBID) CR tablet 300 mg  300 mg Oral Q12H Jimmy Footman, MD   300 mg at 05/06/16 0839  . magnesium hydroxide (MILK OF MAGNESIA) suspension 30 mL  30 mL Oral Daily PRN Audery Amel, MD      . neomycin-bacitracin-polymyxin (NEOSPORIN) ointment   Topical BID Audery Amel, MD   1 application at 05/06/16 1554  . traZODone (DESYREL) tablet 200 mg  200 mg Oral QHS Jimmy Footman, MD   200 mg at 05/05/16 2145    Lab Results:  No results found for this or any previous visit (from the past 48 hour(s)).  Blood Alcohol level:  Lab Results  Component Value Date   ETH <5 05/03/2016    Metabolic Disorder Labs: Lab Results  Component Value Date   HGBA1C 4.7 05/04/2016   Lab Results  Component Value Date   PROLACTIN 27.7 (H) 05/04/2016   Lab Results  Component Value Date   CHOL 199 05/04/2016   TRIG 217 (H) 05/04/2016   HDL 41 05/04/2016   CHOLHDL 4.9 05/04/2016   VLDL 43 (H) 05/04/2016   LDLCALC 115 (H) 05/04/2016    Physical Findings: AIMS:  , ,  ,  ,    CIWA:  CIWA-Ar Total: 6 COWS:     Musculoskeletal: Strength & Muscle Tone: within normal limits Gait & Station: normal Patient leans: N/A  Psychiatric Specialty Exam: Physical Exam  Nursing note and vitals reviewed. Constitutional: She appears well-developed and well-nourished.  HENT:  Head: Normocephalic and atraumatic.  Eyes: Conjunctivae are normal. Pupils are equal, round, and reactive to light.  Neck: Normal  range of motion.  Cardiovascular: Regular rhythm and normal heart sounds.   Respiratory: Effort normal. No respiratory distress.  GI: Soft.  Musculoskeletal: Normal range of motion.  Neurological: She is alert.  Skin: Skin is warm and dry.  Psychiatric: She has a normal mood and affect. Her speech is normal and behavior is normal. Judgment and thought content normal. Her mood appears not anxious. Cognition and memory are normal. She does not express impulsivity. She expresses no suicidal ideation. She exhibits normal recent  memory.    Review of Systems  Constitutional: Negative.   HENT: Negative.   Eyes: Negative.   Respiratory: Negative.   Cardiovascular: Negative.   Gastrointestinal: Negative.   Musculoskeletal: Negative.   Skin: Negative.   Neurological: Negative.   Psychiatric/Behavioral: Positive for depression and memory loss. Negative for hallucinations, substance abuse and suicidal ideas. The patient is nervous/anxious and has insomnia.     Blood pressure 99/80, pulse 99, temperature 98.1 F (36.7 C), temperature source Oral, resp. rate 18, height 5\' 4"  (1.626 m), weight 111.6 kg (246 lb), SpO2 99 %.Body mass index is 42.23 kg/m.  General Appearance: Casual  Eye Contact:  Fair  Speech:  Clear and Coherent  Volume:  Normal  Mood:  Anxious  Affect:  Blunt  Thought Process:  Goal Directed  Orientation:  Full (Time, Place, and Person)  Thought Content:  Logical and Rumination  Suicidal Thoughts:  No  Homicidal Thoughts:  No  Memory:  Immediate;   Good Recent;   Fair Remote;   Fair  Judgement:  Impaired  Insight:  Shallow  Psychomotor Activity:  Decreased  Concentration:  Concentration: Fair  Recall:  [employer] of Knowledge:  Fair  Language:  Fair  Akathisia:  No  Handed:  Right  AIMS (if indicated):     Assets:  Communication Skills Desire for Improvement Financial Resources/Insurance Housing Physical Health Resilience Social Support  ADL's:  Intact   Cognition:  WNL  Sleep:  Number of Hours: 7.45     Treatment Plan Summary: Daily contact with patient to assess and evaluate symptoms and progress in treatment, Medication management and Plan Plan for likely discharge tomorrow after ECT. No change to medicine. Patient is tolerating medication well without any signs of side effects or abuse. Calm and appropriate behavior today. We will do ECT in the morning for 1 more treatment and stabilization. She and I reviewed the importance of having good outpatient treatment in place at discharge.  Mordecai Rasmussen, MD 05/06/2016, 7:20 PM

## 2016-05-06 NOTE — [facility] Group Notes (Signed)
[facility] [name] Group Therapy   05/06/2016 9:30 am   Type of Therapy: Group Therapy   Participation Level: Active   Participation Quality: Attentive, Sharing and Supportive   Affect: Appropriate   Cognitive: Alert and Oriented   Insight: Developing/Improving and Engaged   Engagement in Therapy: Developing/Improving and Engaged   Modes of Intervention: Clarification, Confrontation, Discussion, Education, Exploration, Limit-setting, Orientation, Problem-solving, Rapport Building, Dance movement psychotherapist, Socialization and Support   Summary of Progress/Problems: The topic for group was balance in life. Today's group focused on defining balance in one's own words, identifying things that can knock one off balance, and exploring healthy ways to maintain balance in life. Group members were asked to provide an example of a time when they felt off balance, describe how they handled that situation, and process healthier ways to regain balance in the future. Group members were asked to share the most important tool for maintaining balance that they learned while at [facility] and how they plan to apply this method after discharge. Pt reported that for the pt attaining balance in the pt's life meant caring for others, specifically for the pt's family, but that the pt was focusing on the pt's own self-care at the present.  Pt shared that on one occasion due to concern for the family the pt acted out due to not utilizing self-care and as a result, the pt felt "out of balance".  Pt shared that the pt's primary tool for achieving balance is to focus on the welfare of the pt's family and also herself.  Pt was polite and cooperative with the CSW and other group members and focused and attentive to the topics discussed and the sharing of others.  Pt seems to be making great improvement during group, as evidenced by increased sharing, sharing at length when prompted and pt presents a happy and increasingly energetic, as compared to  previous sessions.  CSW actively validated the pt's opinion and provided feedback.

## 2016-05-06 NOTE — [facility] Group Notes (Signed)
[facility] Group Notes:  (Nursing/MHT/Case Management/Adjunct)  Date:  05/06/2016  Time:  2:35 AM  Type of Therapy:  Group Therapy  Participation Level:  Active  Participation Quality:  Appropriate  Affect:  Appropriate  Cognitive:  Appropriate  Insight:  Good  Engagement in Group:  Engaged  Modes of Intervention:  n/a  Summary of Progress/Problems:  Veva Holes 05/06/2016, 2:35 AM

## 2016-05-06 NOTE — Plan of Care (Signed)
Problem: Coping: Goal: Ability to cope will improve Outcome: Progressing  Patient voice of working on her coping skills  And verbalizing feeling.

## 2016-05-06 NOTE — Progress Notes (Signed)
D:  Affect cheerful on approach. Voice of going to  ECT tomorrow and  Home following that . Patient stated slept fair last night .Stated appetite is fair and energy level  Is normal. Stated concentration is good . Stated on Depression scale 0 , hopeless 0  and anxiety 3 .( low 0-10 high) Denies suicidal  homicidal ideations  .  No auditory hallucinations  No pain concerns . Appropriate ADL'S. Interacting with peers and staff.   Voice of working Market researcher out given. A: Encourage patient participation with unit programming . Instruction  Given on  Medication , verbalize understanding. R: Voice no other concerns. Staff continue to monitor

## 2016-05-06 NOTE — [facility] Group Notes (Signed)
Goals Group Date/Time: 05/06/2016 9:00 AM Type of Therapy and Topic: Group Therapy: Goals Group: SMART Goals   Participation Level: Moderate  Description of Group:    The purpose of a daily goals group is to assist and guide patients in setting recovery/wellness-related goals. The objective is to set goals as they relate to the crisis in which they were admitted. Patients will be using SMART goal modalities to set measurable goals. Characteristics of realistic goals will be discussed and patients will be assisted in setting and processing how one will reach their goal. Facilitator will also assist patients in applying interventions and coping skills learned in psycho-education groups to the SMART goal and process how one will achieve defined goal.   Therapeutic Goals:   -Patients will develop and document one goal related to or their crisis in which brought them into treatment.  -Patients will be guided by [name] using SMART goal setting modality in how to set a measurable, attainable, realistic and time sensitive goal.  -Patients will process barriers in reaching goal.  -Patients will process interventions in how to overcome and successful in reaching goal.   Patient's Goal: Pt shared the pt's goal is to learn to be stronger.  Pt shared the pt's primary motivation for achieving the pt's goal is the welfare of the pt's family.  Pt was polite and cooperative with the CSW and other group members and focused and attentive to the topics discussed and the sharing of others.   Therapeutic Modalities:  Motivational Interviewing  Research officer, political party  SMART goals setting   Dorothe Pea. Athol Bolds, LCSWA, LCAS

## 2016-05-07 ENCOUNTER — Inpatient Hospital Stay: Payer: Medicaid Other | Admitting: Anesthesiology

## 2016-05-07 ENCOUNTER — Encounter: Payer: Self-pay | Admitting: Psychiatry

## 2016-05-07 MED ORDER — KETOROLAC TROMETHAMINE 30 MG/ML IJ SOLN
30.0000 mg | Freq: Once | INTRAMUSCULAR | Status: AC
  Administered 2016-05-07: 30 mg via INTRAVENOUS

## 2016-05-07 MED ORDER — ONDANSETRON HCL 4 MG/2ML IJ SOLN
4.0000 mg | Freq: Once | INTRAMUSCULAR | Status: AC
  Administered 2016-05-07: 4 mg via INTRAVENOUS

## 2016-05-07 MED ORDER — METHOHEXITAL SODIUM 100 MG/10ML IV SOSY
PREFILLED_SYRINGE | INTRAVENOUS | Status: DC | PRN
  Administered 2016-05-07: 100 mg via INTRAVENOUS

## 2016-05-07 MED ORDER — SODIUM CHLORIDE 0.9 % IV SOLN
250.0000 mL | Freq: Once | INTRAVENOUS | Status: AC
  Administered 2016-05-07: 500 mL via INTRAVENOUS

## 2016-05-07 MED ORDER — TRAZODONE HCL 100 MG PO TABS: 200.0000 mg | ORAL_TABLET | Freq: Every day | ORAL | 1 refills | Status: DC

## 2016-05-07 MED ORDER — SODIUM CHLORIDE 0.9 % IV SOLN
INTRAVENOUS | Status: DC | PRN
  Administered 2016-05-07: 10:00:00 via INTRAVENOUS

## 2016-05-07 MED ORDER — ALPRAZOLAM 1 MG PO TABS: 1.0000 mg | ORAL_TABLET | Freq: Three times a day (TID) | ORAL | 0 refills | Status: DC

## 2016-05-07 MED ORDER — SUCCINYLCHOLINE CHLORIDE 200 MG/10ML IV SOSY
PREFILLED_SYRINGE | INTRAVENOUS | Status: DC | PRN
  Administered 2016-05-07: 120 mg via INTRAVENOUS

## 2016-05-07 MED ORDER — LITHIUM CARBONATE ER 300 MG PO TBCR: 300.0000 mg | EXTENDED_RELEASE_TABLET | Freq: Two times a day (BID) | ORAL | 1 refills | Status: DC

## 2016-05-07 NOTE — H&P (Signed)
Brittany [name] is an 30 y.o. female.   Chief Complaint: Mood is feeling much better. No suicidal ideation today. HPI: Recurrent depression and mixed state episodes. Had been in the hospital for 4 days now. Finally feeling better.  Past Medical History:  Diagnosis Date  . ADHD (attention deficit hyperactivity disorder)   . Anxiety   . Arthritis   . Bipolar disorder (HCC)   . Bipolar I disorder, most recent episode depressed (HCC)   . Chronic kidney disease    Kidney stones  . Depression   . Headache     Past Surgical History:  Procedure Laterality Date  . CESAREAN SECTION    . FOOT SURGERY    . KIDNEY SURGERY    . NOVASURE ABLATION  2010  . SHOULDER SURGERY Right   . TUBAL LIGATION      Family History  Problem Relation Age of Onset  . Anxiety disorder Mother   . Depression Mother   . Drug abuse Mother   . Bipolar disorder Mother   . Depression Father   . Anxiety disorder Father   . Drug abuse Father   . Anxiety disorder Sister   . Depression Sister   . ADD / ADHD Sister   . Anxiety disorder Sister   . Depression Sister    Social History:  reports that she has quit smoking. Her smoking use included Cigarettes. She has never used smokeless tobacco. She reports that she drinks about 2.4 - 7.2 oz of alcohol per week . She reports that she uses drugs, including Marijuana.  Allergies:  Allergies  Allergen Reactions  . Ceclor [Cefaclor] Hives and Nausea And Vomiting    "vomiting"    Medications Prior to Admission  Medication Sig Dispense Refill  . alprazolam (XANAX) 2 MG tablet Take 2 mg by mouth 3 (three) times daily as needed for sleep.    Marland Kitchen amphetamine-dextroamphetamine (ADDERALL) 30 MG tablet Take 30 mg by mouth daily.    Marland Kitchen doxylamine, Sleep, (UNISOM) 25 MG tablet Take 25 mg by mouth at bedtime as needed for sleep. Reported on 02/16/2016    . lithium 300 MG tablet Take 300 mg by mouth every morning. Reported on 12/15/2015    . lithium carbonate 300 MG capsule Take  300 mg by mouth at bedtime.    . trazodone (DESYREL) 300 MG tablet Take 600 mg by mouth at bedtime.       No results found for this or any previous visit (from the past 48 hour(s)). No results found.  Review of Systems  Constitutional: Negative.   HENT: Negative.   Eyes: Negative.   Respiratory: Negative.   Cardiovascular: Negative.   Gastrointestinal: Negative.   Musculoskeletal: Negative.   Skin: Negative.   Neurological: Negative.   Psychiatric/Behavioral: Negative for depression, hallucinations, memory loss, substance abuse and suicidal ideas. The patient has insomnia. The patient is not nervous/anxious.     Blood pressure 116/88, pulse 100, temperature 98.3 F (36.8 C), temperature source Oral, resp. rate 18, height  (1.626 m), weight 112 kg (247 lb), SpO2 100 %. Physical Exam  Nursing note and vitals reviewed. Constitutional: She appears well-developed and well-nourished.  HENT:  Head: Normocephalic and atraumatic.  Eyes: Conjunctivae are normal. Pupils are equal, round, and reactive to light.  Neck: Normal range of motion.  Cardiovascular: Regular rhythm and normal heart sounds.   Respiratory: Effort normal and breath sounds normal. No respiratory distress.  GI: Soft.  Musculoskeletal: Normal range of motion.  Neurological: She  is alert.  Skin: Skin is warm and dry.  Psychiatric: She has a normal mood and affect. Her behavior is normal. Judgment and thought content normal.     Assessment/Plan Treatment today and I would really like to see her back in 1 week. We have reviewed the treatment plan.  Mordecai Rasmussen, MD 05/07/2016, 10:25 AM

## 2016-05-07 NOTE — Procedures (Signed)
ECT SERVICES Physician's Interval Evaluation & Treatment Note  Patient Identification: Brittany [name] MRN:  [id number] Date of Evaluation:  05/07/2016 TX #: [id number]  MADRS:   MMSE:   P.E. Findings:  No change to physical. Vitals stable. Lungs clear heart normal.  Psychiatric Interval Note:  Mood is feeling much better and more calm. No psychotic symptoms no mood symptoms of depression.  Subjective:  Patient is a 30 y.o. female seen for evaluation for Electroconvulsive Therapy. No specific new complaint  Treatment Summary:   [x]   Right Unilateral             []  Bilateral   % Energy : 0.3 ms 60%   Impedance: 840 ohms  Seizure Energy Index: 6736 V squared  Postictal Suppression Index: 95%  Seizure Concordance Index: 97%  Medications  Pre Shock: Zofran 4 mg Toradol 30 g Brevital 100 mg succinylcholine 120 mg  Post Shock:    Seizure Duration: 19 seconds by EMG, 55 seconds by EEG   Comments: Follow-up in about one week on August 4   Lungs:  [x]   Clear to auscultation               []  Other:   Heart:    [x]   Regular rhythm             []  irregular rhythm    [x]   Previous H&P reviewed, patient examined and there are NO CHANGES                 []   Previous H&P reviewed, patient examined and there are changes noted.   Mordecai Rasmussen, MD 7/28/201710:27 AM

## 2016-05-07 NOTE — Discharge Summary (Signed)
Physician Discharge Summary Note  Patient:  Brittany [name] is an 30 y.o., female MRN:  [id number] DOB:  04/07/1986 Patient phone:  [phone] (home)  Patient address:   [address] [place] [address],  Total Time spent with patient: 30 minutes  Date of Admission:  05/03/2016 Date of Discharge: 05/07/2016  Reason for Admission:  Patient was admitted to the hospital this past Monday after her ECT treatment because of new onset of mood lability and active suicidal ideation. She had been noncompliant with recommended outpatient treatment  Principal Problem: MDD (major depressive disorder), recurrent episode, moderate (HCC) Discharge Diagnoses: Patient Active Problem List   Diagnosis Date Noted  . MDD (major depressive disorder), recurrent episode, moderate (HCC) [F33.1] 05/04/2016  . Borderline personality disorder [F60.3] 05/04/2016  . Cannabis use disorder, severe, dependence (HCC) [F12.20] 05/04/2016  . Suicidal ideation [R45.851] 05/03/2016  . Noncompliance [Z91.19] 05/03/2016    Past Psychiatric History: Patient has a history of long-standing mood disorder with both depression and agitated manic like symptoms. Has a history of noncompliance. Positive history of psychiatric admissions and suicide attempts. Patient had shown some benefit from ECT but was noncompliant with recommended maintenance treatment plan  Past Medical History:  Past Medical History:  Diagnosis Date  . ADHD (attention deficit hyperactivity disorder)   . Anxiety   . Arthritis   . Bipolar disorder (HCC)   . Bipolar I disorder, most recent episode depressed (HCC)   . Chronic kidney disease    Kidney stones  . Depression   . Headache     Past Surgical History:  Procedure Laterality Date  . CESAREAN SECTION    . FOOT SURGERY    . KIDNEY SURGERY    . NOVASURE ABLATION  2010  . SHOULDER SURGERY Right   . TUBAL LIGATION     Family History:  Family History  Problem Relation Age of Onset  .  Anxiety disorder Mother   . Depression Mother   . Drug abuse Mother   . Bipolar disorder Mother   . Depression Father   . Anxiety disorder Father   . Drug abuse Father   . Anxiety disorder Sister   . Depression Sister   . ADD / ADHD Sister   . Anxiety disorder Sister   . Depression Sister    Family Psychiatric  History: Positive for anxiety Social History:  History  Alcohol Use  . 2.4 - 7.2 oz/week  . 4 Glasses of wine per week     History  Drug Use  . Types: Marijuana    Comment: last used about 3 weeks ago    Social History   Social History  . Marital status: Married    Spouse name: N/A  . Number of children: N/A  . Years of education: N/A   Social History Main Topics  . Smoking status: Former Smoker    Types: Cigarettes  . Smokeless tobacco: Never Used  . Alcohol use 2.4 - 7.2 oz/week    4 Glasses of wine per week  . Drug use:     Types: Marijuana     Comment: last used about 3 weeks ago  . Sexual activity: Yes    Birth control/ protection: None   Other Topics Concern  . None   Social History Narrative  . None    Hospital Course:  Patient was admitted after her ECT treatment on Monday. She remained agitated and distraught much of Tuesday and into early Wednesday but by later Wednesday  afternoon had calm down quite a bit. She was continued on her outpatient Adderall but we have cut down on her Xanax dose to 1 mg 3 times a day. Continued her on lithium. Patient did not get treatment on Wednesday due to a miscommunication but did have ECT treatment today which was tolerated well. Patient is denying any suicidal thoughts. Behavior is calm. Family is comfortable with her returning home. She has been counseled multiple times about the importance of getting an outpatient provider to continue her outpatient medicine and to get in to see a therapist and to please be compliant with [place] as far as recommendations for maintenance ECT treatment.  Physical Findings: AIMS:  ,  ,  ,  ,    CIWA:  CIWA-Ar Total: 6 COWS:     Musculoskeletal: Strength & Muscle Tone: within normal limits Gait & Station: normal Patient leans: N/A  Psychiatric Specialty Exam: Physical Exam  Nursing note and vitals reviewed. Constitutional: She appears well-developed and well-nourished.  HENT:  Head: Normocephalic and atraumatic.  Eyes: Conjunctivae are normal. Pupils are equal, round, and reactive to light.  Neck: Normal range of motion.  Cardiovascular: Regular rhythm and normal heart sounds.   Respiratory: Effort normal and breath sounds normal.  GI: Soft.  Musculoskeletal: Normal range of motion.  Neurological: She is alert.  Skin: Skin is warm and dry.  Psychiatric: She has a normal mood and affect. Her behavior is normal. Judgment and thought content normal.    Review of Systems  Constitutional: Negative.   HENT: Negative.   Eyes: Negative.   Respiratory: Negative.   Cardiovascular: Negative.   Gastrointestinal: Negative.   Musculoskeletal: Negative.   Skin: Negative.   Neurological: Negative.   Psychiatric/Behavioral: Negative for depression, hallucinations, memory loss, substance abuse and suicidal ideas. The patient is nervous/anxious and has insomnia.     Blood pressure 100/89, pulse 98, temperature 98.2 F (36.8 C), temperature source Tympanic, resp. rate 18, height  (1.626 m), weight 112 kg (247 lb), SpO2 94 %.Body mass index is 42.4 kg/m.  General Appearance: Fairly Groomed  Eye Contact:  Good  Speech:  Clear and Coherent  Volume:  Normal  Mood:  Euthymic  Affect:  Congruent  Thought Process:  Coherent  Orientation:  Full (Time, Place, and Person)  Thought Content:  Logical  Suicidal Thoughts:  No  Homicidal Thoughts:  No  Memory:  Immediate;   Good Recent;   Fair Remote;   Fair  Judgement:  Fair  Insight:  Fair  Psychomotor Activity:  Normal  Concentration:  Concentration: Fair  Recall:  [employer] of Knowledge:  Fair  Language:  Fair   Akathisia:  No  Handed:  Right  AIMS (if indicated):     Assets:  Communication Skills Desire for Improvement Financial Resources/Insurance Housing Resilience Social Support  ADL's:  Intact  Cognition:  WNL  Sleep:  Number of Hours: 0     Have you used any form of tobacco in the last 30 days? (Cigarettes, Smokeless Tobacco, Cigars, and/or Pipes): No  Has this patient used any form of tobacco in the last 30 days? (Cigarettes, Smokeless Tobacco, Cigars, and/or Pipes) Yes, No  Blood Alcohol level:  Lab Results  Component Value Date   ETH <5 05/03/2016    Metabolic Disorder Labs:  Lab Results  Component Value Date   HGBA1C 4.7 05/04/2016   Lab Results  Component Value Date   PROLACTIN 27.7 (H) 05/04/2016   Lab Results  Component  Value Date   CHOL 199 05/04/2016   TRIG 217 (H) 05/04/2016   HDL 41 05/04/2016   CHOLHDL 4.9 05/04/2016   VLDL 43 (H) 05/04/2016   LDLCALC 115 (H) 05/04/2016    See Psychiatric Specialty Exam and Suicide Risk Assessment completed by Attending Physician prior to discharge.  Discharge destination:  Home  Is patient on multiple antipsychotic therapies at discharge:  No   Has Patient had three or more failed trials of antipsychotic monotherapy by history:  No  Recommended Plan for Multiple Antipsychotic Therapies: NA  Discharge Instructions    Diet - low sodium heart healthy    Complete by:  As directed   Increase activity slowly    Complete by:  As directed       Medication List    STOP taking these medications   lithium carbonate 300 MG capsule Replaced by:  lithium carbonate 300 MG CR tablet You also have another medication with the same name that you need to continue taking as instructed.     TAKE these medications     Indication  ALPRAZolam 1 MG tablet Commonly known as:  XANAX Take 1 tablet (1 mg total) by mouth 3 (three) times daily. What changed:  medication strength  how much to take  when to take  this  reasons to take this  Indication:  Feeling Anxious   amphetamine-dextroamphetamine 30 MG tablet Commonly known as:  ADDERALL Take 30 mg by mouth daily.  Indication:  Attention Deficit Hyperactivity Disorder   doxylamine (Sleep) 25 MG tablet Commonly known as:  UNISOM Take 25 mg by mouth at bedtime as needed for sleep. Reported on 02/16/2016  Indication:  Trouble Sleeping   lithium 300 MG tablet Take 300 mg by mouth every morning. Reported on 12/15/2015 What changed:  Another medication with the same name was added. Make sure you understand how and when to take each.  Another medication with the same name was removed. Continue taking this medication, and follow the directions you see here.  Indication:  Manic-Depression   lithium carbonate 300 MG CR tablet Commonly known as:  LITHOBID Take 1 tablet (300 mg total) by mouth every 12 (twelve) hours. What changed:  You were already taking a medication with the same name, and this prescription was added. Make sure you understand how and when to take each. Replaces:  lithium carbonate 300 MG capsule  Indication:  Manic-Depression   traZODone 100 MG tablet Commonly known as:  DESYREL Take 2 tablets (200 mg total) by mouth at bedtime. What changed:  medication strength  how much to take  Indication:  Anxiety Disorder      Follow-up Information    [facility]. Go on 05/10/2016.   Why:  Please arrive on Monday July 31st at 11:15am to see Fredricka Bonine for an assessment for medication management, substance abuse treatment and therapy. please bring your ID, social security number, insurance information and proof of income Contact information: [facility] [place] [address] [place],  [place], [place] [address] Phone: [phone] Fax: [phone]          Follow-up recommendations:  Activity:  Activity as tolerated Diet:  Regular diet Other:  Please follow-up with ECT treatment is scheduled for next Friday, August  4. Please work to get a therapist and psychiatric provider as soon as possible as I will not be continuing to prescribe medication after these discharge prescriptions.  Comments:  Patient will be discharged today. Family is comfortable with  discharge plan. Continue current medicine. She needs to get a psychiatric provider as soon as possible as I will not be continuing medicines after these discharge prescriptions. She is to return for ECT in 1 week.  Signed: Mordecai Rasmussen, MD 05/07/2016, 12:16 PM

## 2016-05-07 NOTE — Anesthesia Postprocedure Evaluation (Signed)
Anesthesia Post Note  Patient: Brittany [name]  Procedure(s) Performed: * No procedures listed *  Patient location during evaluation: PACU Anesthesia Type: General Level of consciousness: awake and alert Pain management: pain level controlled Vital Signs Assessment: post-procedure vital signs reviewed and stable Respiratory status: spontaneous breathing, nonlabored ventilation, respiratory function stable and patient connected to nasal cannula oxygen Cardiovascular status: blood pressure returned to baseline and stable Postop Assessment: no signs of nausea or vomiting Anesthetic complications: no    Last Vitals:  Vitals:   05/07/16 1103 05/07/16 1114  BP: 117/81 100/89  Pulse: 98 98  Resp: 18   Temp:  36.8 C    Last Pain:  Vitals:   05/07/16 1114  TempSrc: Tympanic  PainSc: 0-No pain                 Yevette Edwards

## 2016-05-07 NOTE — Anesthesia Procedure Notes (Signed)
Date/Time: 05/07/2016 10:34 AM Performed by: Lily Kocher Pre-anesthesia Checklist: Patient identified, Emergency Drugs available, Suction available and Patient being monitored Patient Re-evaluated:Patient Re-evaluated prior to inductionOxygen Delivery Method: Circle system utilized Preoxygenation: Pre-oxygenation with 100% oxygen Intubation Type: IV induction Ventilation: Mask ventilation without difficulty and Mask ventilation throughout procedure Airway Equipment and Method: Bite block Placement Confirmation: positive ETCO2 Dental Injury: Teeth and Oropharynx as per pre-operative assessment

## 2016-05-07 NOTE — Progress Notes (Signed)
D: Pt is pleasant and cooperative this evening. She is seen smiling and interacting appropriately with staff and peers. Pt states "I've learned that other people have it a lot worse than me. When I feel stressed I'm going to work on counting my blessings." She denies SI/HI/AVH at this time. Pt c/o inability to sleep throughout the night despite administration of prescribed Trazodone. Pt c/o boredom due to inability to sleep. A: Emotional support and encouragement provided. Medications administered with eduction. q15 minute safety checks maintained. R: Pt remains free from harm. Will continue to monitor.

## 2016-05-07 NOTE — Progress Notes (Signed)
Patient awake and requested to use the bathrrom. She was assisted to the bedside commode and transferred back to the bed with no difficulty.

## 2016-05-07 NOTE — BHH Suicide Risk Assessment (Signed)
[facility] Discharge Suicide Risk Assessment   Principal Problem: MDD (major depressive disorder), recurrent episode, moderate (HCC) Discharge Diagnoses:  Patient Active Problem List   Diagnosis Date Noted  . MDD (major depressive disorder), recurrent episode, moderate (HCC) [F33.1] 05/04/2016  . Borderline personality disorder [F60.3] 05/04/2016  . Cannabis use disorder, severe, dependence (HCC) [F12.20] 05/04/2016  . Suicidal ideation [R45.851] 05/03/2016  . Noncompliance [Z91.19] 05/03/2016    Total Time spent with patient: 30 minutes  Musculoskeletal: Strength & Muscle Tone: within normal limits Gait & Station: normal Patient leans: N/A  Psychiatric Specialty Exam: ROS  Blood pressure 100/89, pulse 98, temperature 98.2 F (36.8 C), temperature source Tympanic, resp. rate 18, height 5\' 4"  (1.626 m), weight 112 kg (247 lb), SpO2 94 %.Body mass index is 42.4 kg/m.  General Appearance: Casual  Eye Contact::  Good  Speech:  Clear and Coherent409  Volume:  Normal  Mood:  Euthymic  Affect:  Appropriate  Thought Process:  Goal Directed  Orientation:  Full (Time, Place, and Person)  Thought Content:  Logical  Suicidal Thoughts:  No  Homicidal Thoughts:  No  Memory:  Immediate;   Good Recent;   Good Remote;   Fair  Judgement:  Fair  Insight:  Fair  Psychomotor Activity:  Normal  Concentration:  Fair  Recall:  [employer] of Knowledge:Fair  Language: Fair  Akathisia:  No  Handed:  Right  AIMS (if indicated):     Assets:  Communication Skills Desire for Improvement Housing Physical Health Resilience Social Support  Sleep:  Number of Hours: 0  Cognition: WNL  ADL's:  Intact   Mental Status Per Nursing Assessment::   On Admission:  NA  Demographic Factors:  Caucasian  Loss Factors: NA  Historical Factors: Prior suicide attempts, Family history of mental illness or substance abuse and Impulsivity  Risk Reduction Factors:   Responsible for children under 18 years  of age, Sense of responsibility to family, Living with another person, especially a relative, Positive social support and Positive therapeutic relationship  Continued Clinical Symptoms:  Bipolar Disorder:   Mixed State  Cognitive Features That Contribute To Risk:  Polarized thinking    Suicide Risk:  Mild:  Suicidal ideation of limited frequency, intensity, duration, and specificity.  There are no identifiable plans, no associated intent, mild dysphoria and related symptoms, good self-control (both objective and subjective assessment), few other risk factors, and identifiable protective factors, including available and accessible social support.  Follow-up Information    [facility]. Go on 05/10/2016.   Why:  Please arrive on Monday July 31st at 11:15am to see Fredricka Bonine for an assessment for medication management, substance abuse treatment and therapy. please bring your ID, social security number, insurance information and proof of income Contact information: [facility] [place] [address] [place],  [place], [place] [address] Phone: [phone] Fax: [phone]          Plan Of Care/Follow-up recommendations:  Activity:  Activity as tolerated Diet:  Regular diet Other:  Patient needs to stay on her medicine and then find an outpatient psychiatric provider. She also needs to return for regular ECT treatments as directed. We are expecting to see her back 1 week from today on August 4.  Mordecai Rasmussen, MD 05/07/2016, 12:11 PM

## 2016-05-07 NOTE — Plan of Care (Signed)
Problem: Coping: Goal: Ability to cope will improve Outcome: Progressing Pt identifies that when feeling anxious or suicidal she will start to list the things she is grateful for. Pt states "I've realized a lot of people have it worse than me."  Problem: Safety: Goal: Ability to identify and utilize support systems that promote safety will improve Outcome: Progressing Pt identifies husband and son as her support systems.

## 2016-05-07 NOTE — Progress Notes (Signed)
  [facility] Adult Case Management Discharge Plan :  Will you be returning to the same living situation after discharge:  Yes,  pt will return home to [place] to live with her family At discharge, do you have transportation home?: Yes,  pt will be picked up by her husband Do you have the ability to pay for your medications: Yes,  pt will be provided with prescriptions at discharge  Release of information consent forms completed and in the chart;  Patient's signature needed at discharge.  Patient to Follow up at: Follow-up Information    [facility]. Go on 05/10/2016.   Why:  Please arrive on Monday July 31st at 11:15am to see Fredricka Bonine for an assessment for medication management, substance abuse treatment and therapy. please bring your ID, social security number, insurance information and proof of income Contact information: [facility] [place] [address] [place],  [place], [place] [address] Phone: [phone] Fax: [phone]          Next level of care provider has access to [facility] Link:no  Safety Planning and Suicide Prevention discussed: Yes,  completed with pt  Have you used any form of tobacco in the last 30 days? (Cigarettes, Smokeless Tobacco, Cigars, and/or Pipes): No  Has patient been referred to the Quitline?: N/A patient is not a smoker  Patient has been referred for addiction treatment: Yes  Dorothe Pea Xayden Linsey 05/07/2016, 11:59 AM

## 2016-05-07 NOTE — Progress Notes (Signed)
Patient discharged home. DC instructions provided and explained. Medications reviewed. Rx given. All questions answered. Pt stable at discharge. Denies SI, HI, AVH. Belongings returned. 

## 2016-05-07 NOTE — Transfer of Care (Signed)
Immediate Anesthesia Transfer of Care Note  Patient: Brittany [name]  Procedure(s) Performed: ECT  Patient Location: PACU  Anesthesia Type:General  Level of Consciousness: awake and confused  Airway & Oxygen Therapy: Patient Spontanous Breathing and Patient connected to face mask oxygen  Post-op Assessment: Report given to RN  Post vital signs: Reviewed and stable  Last Vitals:  Vitals:   05/07/16 0908 05/07/16 1043  BP: 116/88 127/82  Pulse: 100 99  Resp:  20  Temp: 36.8 C (P) 36.8 C    Last Pain:  Vitals:   05/07/16 0908  TempSrc: Oral  PainSc:       Patients Stated Pain Goal: 0 (05/04/16 2147)  Complications: No apparent anesthesia complications
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
## 2016-05-07 NOTE — Anesthesia Preprocedure Evaluation (Signed)
Anesthesia Evaluation  Patient identified by MRN, date of birth, ID band Patient awake    Reviewed: Allergy & Precautions, H&P , NPO status , Patient's Chart, lab work & pertinent test results, reviewed documented beta blocker date and time   Airway Mallampati: II   Neck ROM: full    Dental  (+) Poor Dentition   Pulmonary neg pulmonary ROS, former smoker,    Pulmonary exam normal        Cardiovascular negative cardio ROS Normal cardiovascular exam Rate:Normal     Neuro/Psych  Headaches, PSYCHIATRIC DISORDERS negative neurological ROS  negative psych ROS   GI/Hepatic negative GI ROS, Neg liver ROS,   Endo/Other  negative endocrine ROS  Renal/GU Renal diseasenegative Renal ROS  negative genitourinary   Musculoskeletal   Abdominal   Peds  Hematology negative hematology ROS (+)   Anesthesia Other Findings Past Medical History: No date: ADHD (attention deficit hyperactivity disorder) No date: Anxiety No date: Arthritis No date: Bipolar disorder (HCC) No date: Bipolar I disorder, most recent episode depres* No date: Chronic kidney disease     Comment: Kidney stones No date: Depression No date: Headache Past Surgical History: No date: CESAREAN SECTION No date: FOOT SURGERY No date: KIDNEY SURGERY 2010: NOVASURE ABLATION No date: SHOULDER SURGERY Right No date: TUBAL LIGATION BMI    Body Mass Index:  42.40 kg/m     Reproductive/Obstetrics                             Anesthesia Physical Anesthesia Plan  ASA: III  Anesthesia Plan: General   Post-op Pain Management:    Induction:   Airway Management Planned:   Additional Equipment:   Intra-op Plan:   Post-operative Plan:   Informed Consent: I have reviewed the patients History and Physical, chart, labs and discussed the procedure including the risks, benefits and alternatives for the proposed anesthesia with the patient or  authorized representative who has indicated his/her understanding and acceptance.   Dental Advisory Given  Plan Discussed with: CRNA  Anesthesia Plan Comments:         Anesthesia Quick Evaluation

## 2016-05-12 ENCOUNTER — Telehealth: Payer: Self-pay | Admitting: *Deleted

## 2016-05-13 ENCOUNTER — Other Ambulatory Visit: Payer: Self-pay | Admitting: Psychiatry

## 2016-05-14 ENCOUNTER — Inpatient Hospital Stay: Admission: RE | Admit: 2016-05-14 | Payer: Self-pay | Source: Ambulatory Visit

## 2016-05-17 NOTE — Addendum Note (Signed)
Addendum  created 05/17/16 1531 by Henrietta HooverKimberly Charleston Hankin, CRNA   Delete clinical note

## 2016-09-14 DIAGNOSIS — T56892A Toxic effect of other metals, intentional self-harm, initial encounter: Secondary | ICD-10-CM | POA: Diagnosis not present

## 2016-09-14 DIAGNOSIS — F319 Bipolar disorder, unspecified: Secondary | ICD-10-CM

## 2016-09-14 DIAGNOSIS — R110 Nausea: Secondary | ICD-10-CM

## 2016-09-14 DIAGNOSIS — T1491XA Suicide attempt, initial encounter: Secondary | ICD-10-CM | POA: Diagnosis not present

## 2016-09-14 DIAGNOSIS — D72829 Elevated white blood cell count, unspecified: Secondary | ICD-10-CM | POA: Diagnosis not present

## 2016-09-15 ENCOUNTER — Inpatient Hospital Stay
Admission: EM | Admit: 2016-09-15 | Discharge: 2016-09-17 | DRG: 885 | Disposition: A | Discharge status: Home / Self Care | Payer: Medicaid Other | Attending: Psychiatry | Admitting: Psychiatry

## 2016-09-15 DIAGNOSIS — F909 Attention-deficit hyperactivity disorder, unspecified type: Secondary | ICD-10-CM | POA: Diagnosis present

## 2016-09-15 DIAGNOSIS — F429 Obsessive-compulsive disorder, unspecified: Secondary | ICD-10-CM | POA: Diagnosis present

## 2016-09-15 DIAGNOSIS — Z87891 Personal history of nicotine dependence: Secondary | ICD-10-CM | POA: Diagnosis not present

## 2016-09-15 DIAGNOSIS — F3130 Bipolar disorder, current episode depressed, mild or moderate severity, unspecified: Secondary | ICD-10-CM | POA: Diagnosis present

## 2016-09-15 DIAGNOSIS — Z813 Family history of other psychoactive substance abuse and dependence: Secondary | ICD-10-CM

## 2016-09-15 DIAGNOSIS — T1491XA Suicide attempt, initial encounter: Secondary | ICD-10-CM | POA: Diagnosis not present

## 2016-09-15 DIAGNOSIS — G4700 Insomnia, unspecified: Secondary | ICD-10-CM | POA: Diagnosis present

## 2016-09-15 DIAGNOSIS — F4310 Post-traumatic stress disorder, unspecified: Secondary | ICD-10-CM | POA: Diagnosis present

## 2016-09-15 DIAGNOSIS — F410 Panic disorder [episodic paroxysmal anxiety] without agoraphobia: Secondary | ICD-10-CM | POA: Diagnosis present

## 2016-09-15 DIAGNOSIS — Z881 Allergy status to other antibiotic agents status: Secondary | ICD-10-CM | POA: Diagnosis not present

## 2016-09-15 DIAGNOSIS — R45851 Suicidal ideations: Secondary | ICD-10-CM | POA: Diagnosis present

## 2016-09-15 DIAGNOSIS — F603 Borderline personality disorder: Secondary | ICD-10-CM | POA: Diagnosis present

## 2016-09-15 DIAGNOSIS — Z818 Family history of other mental and behavioral disorders: Secondary | ICD-10-CM

## 2016-09-15 DIAGNOSIS — Z915 Personal history of self-harm: Secondary | ICD-10-CM

## 2016-09-15 DIAGNOSIS — F418 Other specified anxiety disorders: Secondary | ICD-10-CM | POA: Diagnosis present

## 2016-09-15 DIAGNOSIS — T56892A Toxic effect of other metals, intentional self-harm, initial encounter: Secondary | ICD-10-CM | POA: Diagnosis not present

## 2016-09-15 DIAGNOSIS — E8881 Metabolic syndrome: Secondary | ICD-10-CM | POA: Diagnosis present

## 2016-09-15 DIAGNOSIS — T50901A Poisoning by unspecified drugs, medicaments and biological substances, accidental (unintentional), initial encounter: Secondary | ICD-10-CM | POA: Diagnosis present

## 2016-09-15 DIAGNOSIS — F1220 Cannabis dependence, uncomplicated: Secondary | ICD-10-CM | POA: Diagnosis present

## 2016-09-15 DIAGNOSIS — D72829 Elevated white blood cell count, unspecified: Secondary | ICD-10-CM | POA: Diagnosis not present

## 2016-09-15 DIAGNOSIS — F319 Bipolar disorder, unspecified: Secondary | ICD-10-CM | POA: Diagnosis not present

## 2016-09-15 LAB — URINE DRUG SCREEN, QUALITATIVE (ARMC ONLY)
AMPHETAMINES, UR SCREEN: NOT DETECTED
Barbiturates, Ur Screen: NOT DETECTED
Benzodiazepine, Ur Scrn: POSITIVE — AB
CANNABINOID 50 NG, UR ~~LOC~~: NOT DETECTED
Cocaine Metabolite,Ur ~~LOC~~: NOT DETECTED
MDMA (ECSTASY) UR SCREEN: NOT DETECTED
Methadone Scn, Ur: NOT DETECTED
OPIATE, UR SCREEN: NOT DETECTED
PHENCYCLIDINE (PCP) UR S: NOT DETECTED
Tricyclic, Ur Screen: NOT DETECTED

## 2016-09-15 LAB — URINALYSIS, COMPLETE (UACMP) WITH MICROSCOPIC
Bilirubin Urine: NEGATIVE
GLUCOSE, UA: NEGATIVE mg/dL
KETONES UR: NEGATIVE mg/dL
LEUKOCYTES UA: NEGATIVE
NITRITE: NEGATIVE
PH: 6 (ref 5.0–8.0)
PROTEIN: NEGATIVE mg/dL
Specific Gravity, Urine: 1.009 (ref 1.005–1.030)

## 2016-09-15 MED ORDER — ACETAMINOPHEN 325 MG PO TABS: 650.0000 mg | ORAL_TABLET | Freq: Four times a day (QID) | ORAL | Status: DC | PRN

## 2016-09-15 MED ORDER — ALPRAZOLAM 1 MG PO TABS: 1.0000 mg | ORAL_TABLET | Freq: Three times a day (TID) | ORAL | Status: DC | PRN

## 2016-09-15 MED ORDER — TRAZODONE HCL 100 MG PO TABS: 100.0000 mg | ORAL_TABLET | Freq: Every evening | ORAL | Status: DC | PRN

## 2016-09-15 MED ORDER — LOPERAMIDE HCL 2 MG PO CAPS
4.0000 mg | ORAL_CAPSULE | Freq: Every day | ORAL | Status: DC
  Administered 2016-09-16 – 2016-09-17 (×2): 4 mg via ORAL
  Filled 2016-09-15 (×2): qty 2

## 2016-09-15 MED ORDER — ALUM & MAG HYDROXIDE-SIMETH 200-200-20 MG/5ML PO SUSP: 30.0000 mL | ORAL | Status: DC | PRN

## 2016-09-15 MED ORDER — MAGNESIUM HYDROXIDE 400 MG/5ML PO SUSP: 30.0000 mL | Freq: Every day | ORAL | Status: DC | PRN

## 2016-09-15 MED ORDER — TEMAZEPAM 15 MG PO CAPS
30.0000 mg | ORAL_CAPSULE | Freq: Every day | ORAL | Status: DC
  Administered 2016-09-15 – 2016-09-16 (×2): 30 mg via ORAL
  Filled 2016-09-15 (×2): qty 2

## 2016-09-15 MED ORDER — ALPRAZOLAM 1 MG PO TABS
1.0000 mg | ORAL_TABLET | Freq: Three times a day (TID) | ORAL | Status: DC
  Administered 2016-09-15 – 2016-09-16 (×2): 1 mg via ORAL
  Filled 2016-09-15 (×2): qty 1

## 2016-09-15 MED ORDER — FLUVOXAMINE MALEATE 50 MG PO TABS
50.0000 mg | ORAL_TABLET | Freq: Every day | ORAL | Status: DC
  Administered 2016-09-15: 50 mg via ORAL
  Filled 2016-09-15: qty 1

## 2016-09-15 MED ORDER — DICYCLOMINE HCL 20 MG PO TABS
20.0000 mg | ORAL_TABLET | Freq: Three times a day (TID) | ORAL | Status: DC
  Administered 2016-09-15 – 2016-09-17 (×8): 20 mg via ORAL
  Filled 2016-09-15 (×10): qty 1

## 2016-09-15 MED ORDER — ZIPRASIDONE HCL 40 MG PO CAPS
40.0000 mg | ORAL_CAPSULE | Freq: Two times a day (BID) | ORAL | Status: DC
  Administered 2016-09-15 – 2016-09-17 (×4): 40 mg via ORAL
  Filled 2016-09-15 (×4): qty 1

## 2016-09-15 MED ORDER — HYDROXYZINE HCL 25 MG PO TABS: 25.0000 mg | ORAL_TABLET | Freq: Three times a day (TID) | ORAL | Status: DC | PRN

## 2016-09-15 MED ORDER — AMPHETAMINE-DEXTROAMPHET ER 5 MG PO CP24
30.0000 mg | ORAL_CAPSULE | Freq: Every day | ORAL | Status: DC
  Administered 2016-09-17: 30 mg via ORAL
  Filled 2016-09-15 (×3): qty 6

## 2016-09-15 NOTE — Tx Team (Signed)
Initial Treatment Plan 09/15/2016 4:43 PM Brittany Buffyhristine [name] ZOX:[id number]    PATIENT STRESSORS: Financial difficulties Health problems Marital or family conflict   PATIENT STRENGTHS: Ability for insight Average or above average intelligence Motivation for treatment/growth Supportive family/friends   PATIENT IDENTIFIED PROBLEMS: ADHD  Depression  Anxiety  Bipolar D/O  GERD             DISCHARGE CRITERIA:  Ability to meet basic life and health needs Improved stabilization in mood, thinking, and/or behavior Medical problems require only outpatient monitoring Verbal commitment to aftercare and medication compliance  PRELIMINARY DISCHARGE PLAN: Attend aftercare/continuing care group Attend PHP/IOP Participate in family therapy Return to previous living arrangement  PATIENT/FAMILY INVOLVEMENT: This treatment plan has been presented to and reviewed with the patient, Brittany [name], and/or family member.  The patient and family have been given the opportunity to ask questions and make suggestions.  Dagoberto LigasJennifer S Dhruva Orndoff, RN 09/15/2016, 4:43 PM

## 2016-09-15 NOTE — Plan of Care (Signed)
Problem: Education: Goal: Knowledge of the prescribed therapeutic regimen will improve Outcome: Progressing Patient met with MD and talked about treatment plan as far as medications she will be taking while she is here.

## 2016-09-15 NOTE — [facility] Suicide Risk Assessment (Signed)
[facility]   Nursing information obtained from:  Patient Demographic factors:  Adolescent or young adult, Caucasian, Unemployed Current Mental Status:  Suicidal ideation indicated by others Loss Factors:  Financial problems / change in socioeconomic status (loss of support from husband) Historical Factors:  Prior suicide attempts Risk Reduction Factors:  Responsible for children under 32 years of age, Sense of responsibility to family, Living with another person, especially a relative  Total Time spent with patient: 1 hour Principal Problem: Bipolar I disorder, most recent episode depressed (HCC) Diagnosis:   Patient Active Problem List   Diagnosis Date Noted  . ADHD (attention deficit hyperactivity disorder) [F90.9] 09/15/2016  . Bipolar I disorder, most recent episode depressed (HCC) [F31.30] 09/15/2016  . Borderline personality disorder [F60.3] 05/04/2016  . Cannabis use disorder, severe, dependence (HCC) [F12.20] 05/04/2016  . Overdose [T50.901A] 05/03/2016  . Noncompliance [Z91.19] 05/03/2016   Subjective Data: overdose.  Continued Clinical Symptoms:  Alcohol Use Disorder Identification Test Final Score (AUDIT): 3 The "Alcohol Use Disorders Identification Test", Guidelines for Use in Primary Care, Second Edition.  World Science writerHealth Organization [facility](WHO). Score between 0-7:  no or low risk or alcohol related problems. Score between 8-15:  moderate risk of alcohol related problems. Score between 16-19:  high risk of alcohol related problems. Score 20 or above:  warrants further diagnostic evaluation for alcohol dependence and treatment.   CLINICAL FACTORS:   Severe Anxiety and/or Agitation Bipolar Disorder:   Depressive phase Depression:   Insomnia Obsessive-Compulsive Disorder   Musculoskeletal: Strength & Muscle Tone: within normal limits Gait & Station: normal Patient leans: N/A  Psychiatric Specialty Exam: Physical Exam  Nursing note and vitals  reviewed.   Review of Systems  Psychiatric/Behavioral: Positive for depression and suicidal ideas. The patient is nervous/anxious and has insomnia.   All other systems reviewed and are negative.   Blood pressure 127/84, pulse 91, temperature 97.2 F (36.2 C), temperature source Oral, resp. rate 18, height 5\' 4"  (1.626 m), weight 112 kg (247 lb), SpO2 96 %.Body mass index is 42.4 kg/m.  General Appearance: Casual  Eye Contact:  Good  Speech:  Clear and Coherent  Volume:  Normal  Mood:  Anxious  Affect:  Depressed  Thought Process:  Goal Directed and Descriptions of Associations: Intact  Orientation:  Full (Time, Place, and Person)  Thought Content:  WDL  Suicidal Thoughts:  Yes.  with intent/plan  Homicidal Thoughts:  No  Memory:  Immediate;   Fair Recent;   Fair Remote;   Fair  Judgement:  Impaired  Insight:  Fair  Psychomotor Activity:  Psychomotor Retardation  Concentration:  Concentration: Fair and Attention Span: Fair  Recall:  [employer]Fair  Fund of Knowledge:  Fair  Language:  Fair  Akathisia:  No  Handed:  Right  AIMS (if indicated):     Assets:  Communication Skills Desire for Improvement Financial Resources/Insurance Housing Physical Health Resilience Social Support Transportation  ADL's:  Intact  Cognition:  WNL  Sleep:         COGNITIVE FEATURES THAT CONTRIBUTE TO RISK:  None    SUICIDE RISK:   Moderate:  Frequent suicidal ideation with limited intensity, and duration, some specificity in terms of plans, no associated intent, good self-control, limited dysphoria/symptomatology, some risk factors present, and identifiable protective factors, including available and accessible social support.   PLAN OF CARE: Hospital admission, medication management, discharge planning.  Ms.  Kevan NyGates is a 30 year old female with a history of bipolar depression and severe  anxiety admitted after suicide attempt by overdose.  1. Suicidal ideation. The patient is able to contract  for safety in the hospital.  2. Mood. We will start Geodon 40 mg twice daily for mood stabilization.  3. Anxiety. We will continue Xanax 1 mg 3 times daily and start Luvox at that time for OCD type symptoms. We will start Minipress for PTSD with time.  4. Insomnia. We will offer Restoril.  5. ADHD. She is on.  6. Metabolic syndrome. Lipid panel, TSH, hemoglobin A1c are pending.  7. Lithium overdose. We will monitor Lithium level.  8. Disposition. She will be discharged to home with her family. She will follow up with the proper psychiatrist.  I certify that inpatient services furnished can reasonably be expected to improve the patient's condition.  Kristine LineaJolanta Maudene Stotler, MD 09/15/2016, 5:05 PM

## 2016-09-15 NOTE — Progress Notes (Signed)
Patient is a new admit to the unit. Skin assessment completed. Patient searched with no contraband found. Patient was oriented to the unit, milieu, and staff. Preoccupied about her medications. Stating she is hungry. Has had a loss of support from her husband, that does not understand her mental illness. States when she tires to seek help he is "ther but not there and looks past me." States she also had her gallbladder removed 5 weeks ago and has problems sometimes with her bowel and bladder. Also states she has a 32 year old son, who she loves very much. Claims to have Irritable bowel Syndrome and would like mediaction for that. Denies SI/HI/AVH and is just ready to discharge. Did verbalize she does not know whey she is here because short term stays have not been helping her. Is on Q15 minute checks for safety. Will continue to monitor.
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
## 2016-09-15 NOTE — Plan of Care (Signed)
Problem: Education: Goal: Knowledge of the prescribed therapeutic regimen will improve Outcome: Progressing Pt has good insight into her condition. She reports "they switched up my meds and I started feeling bad and tried to overdose." Pt verbalizes understanding of her medications and plan of care.  Problem: Safety: Goal: Ability to disclose and discuss suicidal ideas will improve Outcome: Progressing Pt denies SI at this time. She is able to talk about her suicide attempt and reports she is happy she did not die.

## 2016-09-16 LAB — LIPID PANEL
CHOL/HDL RATIO: 4.7 ratio
Cholesterol: 256 mg/dL — ABNORMAL HIGH (ref 0–200)
HDL: 54 mg/dL (ref >40)
LDL CALC: 176 mg/dL — AB (ref 0–99)
Triglycerides: 129 mg/dL (ref <150)
VLDL: 26 mg/dL (ref 0–40)

## 2016-09-16 LAB — COMPREHENSIVE METABOLIC PANEL
ALBUMIN: 3.8 g/dL (ref 3.5–5.0)
ALK PHOS: 50 U/L (ref 38–126)
ALT: 16 U/L (ref 14–54)
AST: 14 U/L — AB (ref 15–41)
Anion gap: 8 (ref 5–15)
BILIRUBIN TOTAL: 0.4 mg/dL (ref 0.3–1.2)
BUN: 13 mg/dL (ref 6–20)
CALCIUM: 9.4 mg/dL (ref 8.9–10.3)
CO2: 23 mmol/L (ref 22–32)
Chloride: 107 mmol/L (ref 101–111)
Creatinine, Ser: 0.8 mg/dL (ref 0.44–1.00)
GFR calc Af Amer: >60 mL/min (ref >60)
GFR calc non Af Amer: >60 mL/min (ref >60)
GLUCOSE: 97 mg/dL (ref 65–99)
POTASSIUM: 4 mmol/L (ref 3.5–5.1)
SODIUM: 138 mmol/L (ref 135–145)
TOTAL PROTEIN: 6.9 g/dL (ref 6.5–8.1)

## 2016-09-16 LAB — CBC
HEMATOCRIT: 40.4 % (ref 35.0–47.0)
HEMOGLOBIN: 13.8 g/dL (ref 12.0–16.0)
MCH: 31 pg (ref 26.0–34.0)
MCHC: 34.2 g/dL (ref 32.0–36.0)
MCV: 90.6 fL (ref 80.0–100.0)
Platelets: 166 10*3/uL (ref 150–440)
RBC: 4.46 MIL/uL (ref 3.80–5.20)
RDW: 13.4 % (ref 11.5–14.5)
WBC: 13.2 10*3/uL — AB (ref 3.6–11.0)

## 2016-09-16 LAB — LITHIUM LEVEL: LITHIUM LVL: 0.15 mmol/L — AB (ref 0.60–1.20)

## 2016-09-16 LAB — TSH: TSH: 1.031 u[IU]/mL (ref 0.350–4.500)

## 2016-09-16 MED ORDER — MENTHOL 3 MG MT LOZG
1.0000 | LOZENGE | OROMUCOSAL | Status: DC | PRN
  Administered 2016-09-17: 3 mg via ORAL
  Filled 2016-09-16: qty 9

## 2016-09-16 MED ORDER — ALPRAZOLAM 1 MG PO TABS
1.0000 mg | ORAL_TABLET | Freq: Three times a day (TID) | ORAL | Status: DC
  Administered 2016-09-16 – 2016-09-17 (×4): 1 mg via ORAL
  Filled 2016-09-16 (×4): qty 1

## 2016-09-16 MED ORDER — FLUVOXAMINE MALEATE 50 MG PO TABS
100.0000 mg | ORAL_TABLET | Freq: Every day | ORAL | Status: DC
  Administered 2016-09-16: 100 mg via ORAL
  Filled 2016-09-16: qty 2

## 2016-09-16 MED ORDER — TOPIRAMATE 100 MG PO TABS
100.0000 mg | ORAL_TABLET | Freq: Every day | ORAL | Status: DC
  Administered 2016-09-16: 100 mg via ORAL
  Filled 2016-09-16: qty 1

## 2016-09-16 MED ORDER — PRAZOSIN HCL 2 MG PO CAPS
2.0000 mg | ORAL_CAPSULE | Freq: Two times a day (BID) | ORAL | Status: DC
  Administered 2016-09-16 – 2016-09-17 (×3): 2 mg via ORAL
  Filled 2016-09-16 (×3): qty 1

## 2016-09-16 MED ORDER — PSEUDOEPHEDRINE HCL ER 120 MG PO TB12
120.0000 mg | ORAL_TABLET | Freq: Two times a day (BID) | ORAL | Status: DC
  Administered 2016-09-16 – 2016-09-17 (×2): 120 mg via ORAL
  Filled 2016-09-16 (×3): qty 1

## 2016-09-16 NOTE — Progress Notes (Signed)
Recreation Therapy Notes  Date: 12.07.17 Time: 1:00 pm Location: Craft Room  Group Topic: Leisure Education  Goal Area(s) Addresses:  Patient will identify activities for each letter of the alphabet. Patient will verbalize ability to integrate positive leisure into life post d/c. Patient will verbalize ability to use leisure as a Associate Professorcoping skill.  Behavioral Response: Attentive, Interactive, Left early  Intervention: Leisure Alphabet  Activity: Patients were given a Leisure Information systems managerAlphabet worksheet and were instructed to write healthy leisure activities for each letter of the alphabet.  Education: LRT educated patients on what they need to participate in leisure.  Education Outcome: Patient left before LRT educated group.  Clinical Observations/Feedback: Patient wrote healthy leisure activities. Patient contributed to group discussion by stating healthy leisure activities.  Jacquelynn CreeGreene,Takima Encina M, LRT/CTRS 09/16/2016 4:10 PM

## 2016-09-16 NOTE — Tx Team (Signed)
Interdisciplinary Treatment and Diagnostic Plan Update  09/16/2016 Time of Session: 10:30am Brittany [name] MRN: [id number]  Principal Diagnosis: Bipolar I disorder, most recent episode depressed (HCC)  Secondary Diagnoses: Principal Problem:   Bipolar I disorder, most recent episode depressed (HCC) Active Problems:   Overdose   Borderline personality disorder   Cannabis use disorder, severe, dependence (HCC)   ADHD (attention deficit hyperactivity disorder)   Current Medications:  Current Facility-Administered Medications  Medication Dose Route Frequency Provider Last Rate Last Dose  . acetaminophen (TYLENOL) tablet 650 mg  650 mg Oral Q6H PRN Jolanta B Pucilowska, MD      . ALPRAZolam Prudy Feeler(XANAX) tablet 1 mg  1 mg Oral TID AC Jolanta B Pucilowska, MD   1 mg at 09/16/16 1109  . alum & mag hydroxide-simeth (MAALOX/MYLANTA) 200-200-20 MG/5ML suspension 30 mL  30 mL Oral Q4H PRN Jolanta B Pucilowska, MD      . amphetamine-dextroamphetamine (ADDERALL XR) 24 hr capsule 30 mg  30 mg Oral Daily Jolanta B Pucilowska, MD      . dicyclomine (BENTYL) tablet 20 mg  20 mg Oral TID AC & HS Jolanta B Pucilowska, MD   20 mg at 09/16/16 1112  . fluvoxaMINE (LUVOX) tablet 100 mg  100 mg Oral QHS Jolanta B Pucilowska, MD      . loperamide (IMODIUM) capsule 4 mg  4 mg Oral Q breakfast Jolanta B Pucilowska, MD   4 mg at 09/16/16 0758  . magnesium hydroxide (MILK OF MAGNESIA) suspension 30 mL  30 mL Oral Daily PRN Jolanta B Pucilowska, MD      . menthol-cetylpyridinium (CEPACOL) lozenge 3 mg  1 lozenge Oral PRN Jolanta B Pucilowska, MD      . prazosin (MINIPRESS) capsule 2 mg  2 mg Oral BID Jolanta B Pucilowska, MD   2 mg at 09/16/16 1110  . pseudoephedrine (SUDAFED) 12 hr tablet 120 mg  120 mg Oral BID Jolanta B Pucilowska, MD   120 mg at 09/16/16 1110  . temazepam (RESTORIL) capsule 30 mg  30 mg Oral QHS Shari ProwsJolanta B Pucilowska, MD   30 mg at 09/15/16 2138  . topiramate (TOPAMAX) tablet 100 mg  100 mg Oral QHS  Jolanta B Pucilowska, MD      . ziprasidone (GEODON) capsule 40 mg  40 mg Oral BID WC Jolanta B Pucilowska, MD   40 mg at 09/16/16 0759   PTA Medications: Prescriptions Prior to Admission  Medication Sig Dispense Refill Last Dose  . ALPRAZolam (XANAX) 1 MG tablet Take 1 tablet (1 mg total) by mouth 3 (three) times daily. 60 tablet 0   . amphetamine-dextroamphetamine (ADDERALL) 30 MG tablet Take 30 mg by mouth daily.   unknown at unknown  . doxylamine, Sleep, (UNISOM) 25 MG tablet Take 25 mg by mouth at bedtime as needed for sleep. Reported on 02/16/2016   unknown at unknown  . lithium 300 MG tablet Take 300 mg by mouth every morning. Reported on 12/15/2015   unknown at unknown  . lithium carbonate (LITHOBID) 300 MG CR tablet Take 1 tablet (300 mg total) by mouth every 12 (twelve) hours. 60 tablet 1   . traZODone (DESYREL) 100 MG tablet Take 2 tablets (200 mg total) by mouth at bedtime. 30 tablet 1     Patient Stressors: Financial difficulties Health problems Marital or family conflict  Patient Strengths: Ability for insight Average or above average intelligence Motivation for treatment/growth Supportive family/friends  Treatment Modalities: Medication Management, Group therapy, Case management,  1 to  1 session with clinician, Psychoeducation, Recreational therapy.   Physician Treatment Plan for Primary Diagnosis: Bipolar I disorder, most recent episode depressed (HCC) Long Term Goal(s): Improvement in symptoms so as ready for discharge Improvement in symptoms so as ready for discharge   Short Term Goals: Ability to identify changes in lifestyle to reduce recurrence of condition will improve Ability to verbalize feelings will improve Ability to disclose and discuss suicidal ideas Ability to demonstrate self-control will improve Ability to identify and develop effective coping behaviors will improve Ability to maintain clinical measurements within normal limits will improve Compliance  with prescribed medications will improve Ability to identify changes in lifestyle to reduce recurrence of condition will improve Ability to demonstrate self-control will improve  Medication Management: Evaluate patient's response, side effects, and tolerance of medication regimen.  Therapeutic Interventions: 1 to 1 sessions, Unit Group sessions and Medication administration.  Evaluation of Outcomes: Progressing  Physician Treatment Plan for Secondary Diagnosis: Principal Problem:   Bipolar I disorder, most recent episode depressed (HCC) Active Problems:   Overdose   Borderline personality disorder   Cannabis use disorder, severe, dependence (HCC)   ADHD (attention deficit hyperactivity disorder)  Long Term Goal(s): Improvement in symptoms so as ready for discharge Improvement in symptoms so as ready for discharge   Short Term Goals: Ability to identify changes in lifestyle to reduce recurrence of condition will improve Ability to verbalize feelings will improve Ability to disclose and discuss suicidal ideas Ability to demonstrate self-control will improve Ability to identify and develop effective coping behaviors will improve Ability to maintain clinical measurements within normal limits will improve Compliance with prescribed medications will improve Ability to identify changes in lifestyle to reduce recurrence of condition will improve Ability to demonstrate self-control will improve     Medication Management: Evaluate patient's response, side effects, and tolerance of medication regimen.  Therapeutic Interventions: 1 to 1 sessions, Unit Group sessions and Medication administration.  Evaluation of Outcomes: Progressing   RN Treatment Plan for Primary Diagnosis: Bipolar I disorder, most recent episode depressed (HCC) Long Term Goal(s): Knowledge of disease and therapeutic regimen to maintain health will improve  Short Term Goals: Ability to remain free from injury will  improve, Ability to verbalize feelings will improve, Ability to disclose and discuss suicidal ideas, Ability to identify and develop effective coping behaviors will improve and Compliance with prescribed medications will improve  Medication Management: RN will administer medications as ordered by provider, will assess and evaluate patient's response and provide education to patient for prescribed medication. RN will report any adverse and/or side effects to prescribing provider.  Therapeutic Interventions: 1 on 1 counseling sessions, Psychoeducation, Medication administration, Evaluate responses to treatment, Monitor vital signs and CBGs as ordered, Perform/monitor CIWA, COWS, AIMS and Fall Risk screenings as ordered, Perform wound care treatments as ordered.  Evaluation of Outcomes: Progressing   [name] Treatment Plan for Primary Diagnosis: Bipolar I disorder, most recent episode depressed (HCC) Long Term Goal(s): Safe transition to appropriate next level of care at discharge, Engage patient in therapeutic group addressing interpersonal concerns.  Short Term Goals: Engage patient in aftercare planning with referrals and resources, Increase social support, Increase ability to appropriately verbalize feelings, Increase emotional regulation, Facilitate acceptance of mental health diagnosis and concerns, Identify triggers associated with mental health/substance abuse issues and Increase skills for wellness and recovery  Therapeutic Interventions: Assess for all discharge needs, 1 to 1 time with Social worker, Explore available resources and support systems, Assess for adequacy in community support  network, Educate family and significant other(s) on suicide prevention, Complete Psychosocial Assessment, Interpersonal group therapy.  Evaluation of Outcomes: Progressing   Progress in Treatment: Attending groups: Yes. Participating in groups: Yes. Taking medication as prescribed: Yes. Toleration  medication: Yes. Family/Significant other contact made: No, will contact:  identified support person Patient understands diagnosis: Yes. Discussing patient identified problems/goals with staff: Yes. Medical problems stabilized or resolved: Yes. Denies suicidal/homicidal ideation: Yes. Issues/concerns per patient self-inventory: No. Other: n/a  New problem(s) identified: None identified at this time.   New Short Term/Long Term Goal(s): None identified at this time.   Discharge Plan or Barriers: Patient will discharge home and follow-up with outpatient provider.   Reason for Continuation of Hospitalization: Medication stabilization  Estimated Length of Stay: 3 days.   Attendees: Patient: Brittany [name] 09/16/2016 1:07 PM  Physician: Dr. Jennet MaduroPucilowska, MD 09/16/2016 1:07 PM  Nursing: Leonia ReaderPhyllis Cobb, RN 09/16/2016 1:07 PM  RN Care Manager: 09/16/2016 1:07 PM  Social Worker: Fredrich BirksAmaris G. Garnette CzechSampson MSW, LCSWA 09/16/2016 1:07 PM  Recreational Therapist: Jacquelynn CreeElizabeth M. Greene, LRT/CTRS 09/16/2016 1:07 PM  Other:  09/16/2016 1:07 PM  Other:  09/16/2016 1:07 PM  Other: 09/16/2016 1:07 PM    Scribe for Treatment Team: Arelia LongestAmaris G Ephraim Reichel, LCSWA 09/16/2016 1:10 PM
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
## 2016-09-16 NOTE — Progress Notes (Signed)
D: Pt is pleasant and cooperative this evening. She states "They switched up my meds and I started feeling bad and tried to overdose. It sucks because it has to get this bad in order to get help." Pt reports that she is happy she did not die. She reports a desire to go home soon to be with her son. Denies SI/HI/AVH at this time. A: Emotional support and encouragement provided. Medications administered with education. q15 minute safety checks maintained. R: Pt remains free from harm. Will continue to monitor.

## 2016-09-16 NOTE — [facility] Group Notes (Signed)
[facility] [name] Group Therapy   09/16/2016 1pm   Type of Therapy: Group Therapy   Participation Level: Active   Participation Quality: Attentive, Sharing and Supportive   Affect: Appropriate   Cognitive: Alert and Oriented   Insight: Developing/Improving and Engaged   Engagement in Therapy: Developing/Improving and Engaged   Modes of Intervention: Clarification, Confrontation, Discussion, Education, Exploration, Limit-setting, Orientation, Problem-solving, Rapport Building, Dance movement psychotherapisteality Testing, Socialization and Support   Summary of Progress/Problems: The topic for group was balance in life. Today's group focused on defining balance in one's own words, identifying things that can knock one off balance, and exploring healthy ways to maintain balance in life. Group members were asked to provide an example of a time when they felt off balance, describe how they handled that situation, and process healthier ways to regain balance in the future. Group members were asked to share the most important tool for maintaining balance that they [facility] and how they plan to apply this method after discharge. Pt defined balance as "achieving equilibrium." She identified her mental/emotional area as her unbalanced area of life. She stated in order to achieve balance, she will stay compliant with medications even when she believes her strength is greater than medications.  Hampton AbbotKadijah Azavion [name], MSW, LCSWA 09/16/2016, 10:52AM

## 2016-09-16 NOTE — Progress Notes (Signed)
D. Pt pleasant and cooperative this am. Pt reports feeling like she's "getting a cold". Pt currently denies SI/HI and AVH at this time and verbally contracts for safety. Pt rates her anxiety a 7/10. Patient refused medication for ADD reporting that she didn't feel as though she needed it while in the hospital.  A. Labs and vitals monitored. Pt compliant with all other medications. Pt supported emotionally and encouraged to express concerns and ask questions.   R. Pt remains safe with 15 minute checks. Will continue POC.

## 2016-09-16 NOTE — [facility] Group Notes (Signed)
[facility] Group Notes:  (Nursing/MHT/Case Management/Adjunct)  Date:  09/16/2016  Time:  11:05 PM  Type of Therapy:  Psychoeducational Skills  Participation Level:  Active  Participation Quality:  Supportive  Affect:  Appropriate  Cognitive:  Alert  Insight:  Good  Engagement in Group:  Engaged  Modes of Intervention:  Activity  Summary of Progress/Problems:  Brittany [name] 09/16/2016, 11:05 PM

## 2016-09-16 NOTE — Progress Notes (Signed)
Recreation Therapy Notes  INPATIENT RECREATION THERAPY ASSESSMENT  Patient Details Name: Brittany [name] MRN: [id number] DOB: 26-Jan-1986 Today's Date: 09/16/2016  Patient Stressors: Family, Relationship, Other (Comment) (Not good relationship with family - they do not understand her mental illness; husband acts like she did this to inconvience him; stays bitter towards her son's father who relapsed on drugs and is now clean [place]  Coping Skills:   Isolate, Arguments, Substance Abuse, Avoidance, Self-Injury, Exercise, Art/Dance, Talking, Music, Sports  Personal Challenges: Anger, Communication, Concentration, Decision-Making, Problem-Solving, Relationships, Self-Esteem/Confidence, Social Interaction, Stress Management, Trusting Others  Leisure Interests (2+):  Sports - Swimming, Technical brewerature - Other (Comment), Individual - Other (Comment) (Spend time with son, kayak, exercise)  Awareness of Community Resources:  Yes  Community Resources:  [employer], [place]  Current Use: Yes  If no, Barriers?:    Patient Strengths:  Try to be selfless, loving  Patient Identified Areas of Improvement:  Coping  Current Recreation Participation:  Exercising  Patient Goal for Hospitalization:  To get medication right  [place]  [place] Residence[place]   Current [place] (including self-harm):  No  Current HI:  No  Consent to Intern Participation: N/A   Jacquelynn CreeGreene,Lanaysia Fritchman M, LRT/CTRS 09/16/2016, 4:23 PM

## 2016-09-16 NOTE — Progress Notes (Signed)
[facility] MD Progress Note  09/16/2016 1:31 PM Brittany [name]  MRN:  [id number]  Subjective:  Brittany [name] reports feeling better today. She slept through the night for the first time in several days. She feels rested. She does not feel oversedated from Luvox. She is excited about new medications. She wants to proceed with other medication adjustments. There are no somatic complaints. She participates in programming. We will make several medication adjustments. We will give Xanax during the day only, we will increase Luvox 200 mg at bedtime, we'll start Topamax, we will start Minipress for PTSD today. The patient denies suicidal ideation and is pressing for discharge.  Per nursing: D: Pt is pleasant and cooperative this evening. She states "They switched up my meds and I started feeling bad and tried to overdose. It sucks because it has to get this bad in order to get help." Pt reports that she is happy she did not die. She reports a desire to go home soon to be with her son. Denies SI/HI/AVH at this time. A: Emotional support and encouragement provided. Medications administered with education. q15 minute safety checks maintained. R: Pt remains free from harm. Will continue to monitor.  Principal Problem: Bipolar I disorder, most recent episode depressed ([place]) Diagnosis:   Patient Active Problem List   Diagnosis Date Noted  . ADHD (attention deficit hyperactivity disorder) [F90.9] 09/15/2016  . Bipolar I disorder, most recent episode depressed ([place]) [F31.30] 09/15/2016  . Borderline personality disorder [F60.3] 05/04/2016  . Cannabis use disorder, severe, dependence ([place]) [F12.20] 05/04/2016  . Overdose [T50.901A] 05/03/2016  . Noncompliance [Z91.19] 05/03/2016   Total Time spent with patient: 20 minutes  Past Psychiatric History: depression.  Past Medical History:  Past Medical History:  Diagnosis Date  . ADHD (attention deficit hyperactivity disorder)   . Anxiety   . Arthritis   . Bipolar  disorder ([place])   . Bipolar I disorder, most recent episode depressed ([place])   . Chronic kidney disease    Kidney stones  . Depression   . Headache     Past Surgical History:  Procedure Laterality Date  . CESAREAN SECTION    . FOOT SURGERY    . KIDNEY SURGERY    . [place]  2010  . SHOULDER SURGERY Right   . TUBAL LIGATION     Family History:  Family History  Problem Relation Age of Onset  . Anxiety disorder Mother   . Depression Mother   . Drug abuse Mother   . Bipolar disorder Mother   . Depression Father   . Anxiety disorder Father   . Drug abuse Father   . Anxiety disorder Sister   . Depression Sister   . ADD / ADHD Sister   . Anxiety disorder Sister   . Depression Sister    Family Psychiatric  History: see H&P. Social History:  History  Alcohol Use  . 2.4 - 7.2 oz/week  . 4 Glasses of wine per week     History  Drug Use  . Types: Marijuana    Comment: last used about 3 weeks ago    Social History   Social History  . Marital status: Married    Spouse name: N/A  . Number of children: N/A  . Years of education: N/A   Social History Main Topics  . Smoking status: Former Smoker    Types: Cigarettes  . Smokeless tobacco: Never Used  . Alcohol use 2.4 - 7.2 oz/week    4 Glasses of  wine per week  . Drug use:     Types: Marijuana     Comment: last used about 3 weeks ago  . Sexual activity: Yes    Birth control/ protection: None   Other Topics Concern  . None   Social History Narrative  . None   Additional Social History:                         Sleep: Poor  Appetite:  Fair  Current Medications: Current Facility-Administered Medications  Medication Dose Route Frequency Provider Last Rate Last Dose  . acetaminophen (TYLENOL) tablet 650 mg  650 mg Oral Q6H PRN Devon Kingdon B Bonifacio Pruden, MD      . ALPRAZolam Duanne Moron) tablet 1 mg  1 mg Oral TID AC Ramiro Pangilinan B Demarian Epps, MD   1 mg at 09/16/16 1109  . alum & mag hydroxide-simeth  (MAALOX/MYLANTA) 200-200-20 MG/5ML suspension 30 mL  30 mL Oral Q4H PRN Kalynne Womac B Jaylenne Hamelin, MD      . amphetamine-dextroamphetamine (ADDERALL XR) 24 hr capsule 30 mg  30 mg Oral Daily Read Bonelli B Ayah Cozzolino, MD      . dicyclomine (BENTYL) tablet 20 mg  20 mg Oral TID AC & HS Annielee Jemmott B Lysle Yero, MD   20 mg at 09/16/16 1112  . fluvoxaMINE (LUVOX) tablet 100 mg  100 mg Oral QHS Rhilyn Battle B Marcie Shearon, MD      . loperamide (IMODIUM) capsule 4 mg  4 mg Oral Q breakfast Takira Sherrin B Etana Beets, MD   4 mg at 09/16/16 0758  . magnesium hydroxide (MILK OF MAGNESIA) suspension 30 mL  30 mL Oral Daily PRN Nguyen Todorov B Krishawna Stiefel, MD      . menthol-cetylpyridinium (CEPACOL) lozenge 3 mg  1 lozenge Oral PRN Ermel Verne B Avri Paiva, MD      . prazosin (MINIPRESS) capsule 2 mg  2 mg Oral BID Tanaysha Alkins B Cecilia Vancleve, MD   2 mg at 09/16/16 1110  . pseudoephedrine (SUDAFED) 12 hr tablet 120 mg  120 mg Oral BID Adeleigh Barletta B Odell Fasching, MD   120 mg at 09/16/16 1110  . temazepam (RESTORIL) capsule 30 mg  30 mg Oral QHS Clovis Fredrickson, MD   30 mg at 09/15/16 2138  . topiramate (TOPAMAX) tablet 100 mg  100 mg Oral QHS Kaisey Huseby B Denilson Salminen, MD      . ziprasidone (GEODON) capsule 40 mg  40 mg Oral BID WC Fabiha Rougeau B Brieanna Nau, MD   40 mg at 09/16/16 0759    Lab Results:  Results for orders placed or performed during the hospital encounter of 09/15/16 (from the past 48 hour(s))  Urine Drug Screen, Qualitative ([place] only)     Status: Abnormal   Collection Time: 09/15/16  4:27 PM  Result Value Ref Range   Tricyclic, Ur Screen NONE DETECTED NONE DETECTED   Amphetamines, Ur Screen NONE DETECTED NONE DETECTED   MDMA (Ecstasy)Ur Screen NONE DETECTED NONE DETECTED   Cocaine Metabolite,Ur [place] NONE DETECTED NONE DETECTED   Opiate, Ur Screen NONE DETECTED NONE DETECTED   Phencyclidine (PCP) Ur S NONE DETECTED NONE DETECTED   Cannabinoid 50 Ng, Ur [place] NONE DETECTED NONE DETECTED   Barbiturates, Ur Screen NONE DETECTED NONE DETECTED    Benzodiazepine, Ur Scrn POSITIVE (A) NONE DETECTED   Methadone Scn, Ur NONE DETECTED NONE DETECTED    Comment: (NOTE) 272  Tricyclics, urine               Cutoff 1000 ng/mL 200  Amphetamines, urine  Cutoff 1000 ng/mL 300  MDMA (Ecstasy), urine           Cutoff 500 ng/mL 400  Cocaine Metabolite, urine       Cutoff 300 ng/mL 500  Opiate, urine                   Cutoff 300 ng/mL 600  Phencyclidine (PCP), urine      Cutoff 25 ng/mL 700  Cannabinoid, urine              Cutoff 50 ng/mL 800  Barbiturates, urine             Cutoff 200 ng/mL 900  Benzodiazepine, urine           Cutoff 200 ng/mL 1000 Methadone, urine                Cutoff 300 ng/mL 1100 1200 The urine drug screen provides only a preliminary, unconfirmed 1300 analytical test result and should not be used for non-medical 1400 purposes. Clinical consideration and professional judgment should 1500 be applied to any positive drug screen result due to possible 1600 interfering substances. A more specific alternate chemical method 1700 must be used in order to obtain a confirmed analytical result.  1800 Gas chromato graphy / mass spectrometry (GC/MS) is the preferred 1900 confirmatory method.   Urinalysis, Complete w Microscopic     Status: Abnormal   Collection Time: 09/15/16  4:27 PM  Result Value Ref Range   Color, Urine STRAW (A) YELLOW   APPearance CLEAR (A) CLEAR   Specific Gravity, Urine 1.009 1.005 - 1.030   pH 6.0 5.0 - 8.0   Glucose, UA NEGATIVE NEGATIVE mg/dL   Hgb urine dipstick MODERATE (A) NEGATIVE   Bilirubin Urine NEGATIVE NEGATIVE   Ketones, ur NEGATIVE NEGATIVE mg/dL   Protein, ur NEGATIVE NEGATIVE mg/dL   Nitrite NEGATIVE NEGATIVE   Leukocytes, UA NEGATIVE NEGATIVE   RBC / HPF 0-5 0 - 5 RBC/hpf   WBC, UA 0-5 0 - 5 WBC/hpf   Bacteria, UA MANY (A) NONE SEEN   Squamous Epithelial / LPF 0-5 (A) NONE SEEN  CBC     Status: Abnormal   Collection Time: 09/16/16  6:32 AM  Result Value Ref Range   WBC  13.2 (H) 3.6 - 11.0 K/uL   RBC 4.46 3.80 - 5.20 MIL/uL   Hemoglobin 13.8 12.0 - 16.0 g/dL   HCT 40.4 35.0 - 47.0 %   MCV 90.6 80.0 - 100.0 fL   MCH 31.0 26.0 - 34.0 pg   MCHC 34.2 32.0 - 36.0 g/dL   RDW 13.4 11.5 - 14.5 %   Platelets 166 150 - 440 K/uL  Comprehensive metabolic panel     Status: Abnormal   Collection Time: 09/16/16  6:32 AM  Result Value Ref Range   Sodium 138 135 - 145 mmol/L   Potassium 4.0 3.5 - 5.1 mmol/L   Chloride 107 101 - 111 mmol/L   CO2 23 22 - 32 mmol/L   Glucose, Bld 97 65 - 99 mg/dL   BUN 13 6 - 20 mg/dL   Creatinine, Ser 0.80 0.44 - 1.00 mg/dL   Calcium 9.4 8.9 - 10.3 mg/dL   Total Protein 6.9 6.5 - 8.1 g/dL   Albumin 3.8 3.5 - 5.0 g/dL   AST 14 (L) 15 - 41 U/L   ALT 16 14 - 54 U/L   Alkaline Phosphatase 50 38 - 126 U/L   Total Bilirubin 0.4 0.3 - 1.2  mg/dL   GFR calc non Af Amer >60 >60 mL/min   GFR calc Af Amer >60 >60 mL/min    Comment: (NOTE) The eGFR has been calculated using the CKD EPI equation. This calculation has not been validated in all clinical situations. eGFR's persistently <60 mL/min signify possible Chronic Kidney Disease.    Anion gap 8 5 - 15  Lipid panel     Status: Abnormal   Collection Time: 09/16/16  6:32 AM  Result Value Ref Range   Cholesterol 256 (H) 0 - 200 mg/dL   Triglycerides 129 <150 mg/dL   HDL 54 >40 mg/dL   Total CHOL/HDL Ratio 4.7 RATIO   VLDL 26 0 - 40 mg/dL   LDL Cholesterol 176 (H) 0 - 99 mg/dL    Comment:        Total Cholesterol/HDL:CHD Risk Coronary Heart Disease Risk Table                     Men   Women  1/2 Average Risk   3.4   3.3  Average Risk       5.0   4.4  2 X Average Risk   9.6   7.1  3 X Average Risk  23.4   11.0        Use the calculated Patient Ratio above and the CHD Risk Table to determine the patient's CHD Risk.        ATP III CLASSIFICATION (LDL):  <100     mg/dL   Optimal  100-129  mg/dL   Near or Above                    Optimal  130-159  mg/dL   Borderline  160-189   mg/dL   High  >190     mg/dL   Very High   TSH     Status: None   Collection Time: 09/16/16  6:32 AM  Result Value Ref Range   TSH 1.031 0.350 - 4.500 uIU/mL    Comment: Performed by a 3rd Generation assay with a functional sensitivity of <=0.01 uIU/mL.  Lithium level     Status: Abnormal   Collection Time: 09/16/16  6:32 AM  Result Value Ref Range   Lithium Lvl 0.15 (L) 0.60 - 1.20 mmol/L    Blood Alcohol level:  Lab Results  Component Value Date   ETH <5 41/96/2229    Metabolic Disorder Labs: Lab Results  Component Value Date   HGBA1C 4.7 05/04/2016   Lab Results  Component Value Date   PROLACTIN 27.7 (H) 05/04/2016   Lab Results  Component Value Date   CHOL 256 (H) 09/16/2016   TRIG 129 09/16/2016   HDL 54 09/16/2016   CHOLHDL 4.7 09/16/2016   VLDL 26 09/16/2016   LDLCALC 176 (H) 09/16/2016   LDLCALC 115 (H) 05/04/2016    Physical Findings: AIMS:  , ,  ,  ,    CIWA:    COWS:     Musculoskeletal: Strength & Muscle Tone: within normal limits Gait & Station: normal Patient leans: N/A  Psychiatric Specialty Exam: Physical Exam  Nursing note and vitals reviewed.   Review of Systems  Psychiatric/Behavioral: Positive for depression and suicidal ideas. The patient is nervous/anxious and has insomnia.   All other systems reviewed and are negative.   Blood pressure (!) 134/92, pulse 91, temperature 97.8 F (36.6 C), temperature source Oral, resp. rate 18, height _0  (1.626 m), weight 112 kg (  247 lb), SpO2 95 %.Body mass index is 42.4 kg/m.  General Appearance: Casual  Eye Contact:  Good  Speech:  Clear and Coherent  Volume:  Normal  Mood:  Anxious  Affect:  Congruent  Thought Process:  Goal Directed and Descriptions of Associations: Intact  Orientation:  Full (Time, Place, and Person)  Thought Content:  WDL  Suicidal Thoughts:  Yes.  with intent/plan  Homicidal Thoughts:  No  Memory:  Immediate;   Fair Recent;   Fair Remote;   Fair  Judgement:   Impaired  Insight:  Shallow  Psychomotor Activity:  Normal  Concentration:  Concentration: Fair and Attention Span: Fair  Recall:  [employer] of Knowledge:  Fair  Language:  Fair  Akathisia:  No  Handed:  Right  AIMS (if indicated):     Assets:  Communication Skills Desire for Improvement Financial Resources/Insurance Housing Intimacy Physical Health Resilience Social Support Transportation  ADL's:  Intact  Cognition:  WNL  Sleep:  Number of Hours: 7.5     Treatment Plan Summary: Daily contact with patient to assess and evaluate symptoms and progress in treatment and Medication management   Ms.  Inda [name] is a 30 year old female with a history of bipolar depression and severe anxiety admitted after suicide attempt by overdose.  1. Suicidal ideation. The patient is able to contract for safety in the hospital.  2. Mood. We started Geodon 40 mg twice daily for mood stabilization. We will add Topamax for weight control.   3. Anxiety. We continue Xanax 1 mg 3 times daily. Will increase Luvox to 100 mg nightly at that time for OCD type symptoms and start Minipress for PTSD.  4. Insomnia. We will offer Restoril.  5. ADHD. She is on.  6. Metabolic syndrome. Lipid panel, TSH, hemoglobin A1c are pending.  7. Lithium overdose. Lithium level 0.15.  8. Disposition. She will be discharged to home with her family. She will follow up with the proper psychiatrist.  Orson Slick, MD 09/16/2016, 1:31 PM

## 2016-09-16 NOTE — [facility] Suicide Risk Assessment (Signed)
[facility] INPATIENT:  Family/Significant Other Suicide Prevention Education  Suicide Prevention Education:  Education Completed;Joe Madilyn FiremanHayes (husband [phone]), has been identified by the patient as the family member/significant other with whom the patient will be residing, and identified as the person(s) who will aid the patient in the event of a mental health crisis (suicidal ideations/suicide attempt).  With written consent from the patient, the family member/significant other has been provided the following suicide prevention education, prior to the and/or following the discharge of the patient.  The suicide prevention education provided includes the following:  Suicide risk factors  Suicide prevention and interventions  National Suicide Hotline telephone number  [facility] assessment telephone number  [facility] Emergency Assistance 911  [facility] and/or Residential Mobile Crisis Unit telephone number  Request made of family/significant other to:  Remove weapons (e.g., guns, rifles, knives), all items previously/currently identified as safety concern.    Remove drugs/medications (over-the-counter, prescriptions, illicit drugs), all items previously/currently identified as a safety concern.  The family member/significant other verbalizes understanding of the suicide prevention education information provided.  The family member/significant other agrees to remove the items of safety concern listed above.  Dilpreet Faires G. Garnette CzechSampson MSW, LCSWA 09/16/2016, 2:24 PM

## 2016-09-17 LAB — HEMOGLOBIN A1C
Hgb A1c MFr Bld: 5.3 % (ref 4.8–5.6)
Mean Plasma Glucose: 105 mg/dL

## 2016-09-17 MED ORDER — LOPERAMIDE HCL 2 MG PO CAPS: 4.0000 mg | ORAL_CAPSULE | Freq: Every day | ORAL | 1 refills | Status: DC

## 2016-09-17 MED ORDER — PRAZOSIN HCL 2 MG PO CAPS: 2.0000 mg | ORAL_CAPSULE | Freq: Two times a day (BID) | ORAL | 1 refills | Status: DC

## 2016-09-17 MED ORDER — TOPIRAMATE 100 MG PO TABS: 100.0000 mg | ORAL_TABLET | Freq: Every day | ORAL | 1 refills | Status: DC

## 2016-09-17 MED ORDER — ZIPRASIDONE HCL 40 MG PO CAPS: 40.0000 mg | ORAL_CAPSULE | Freq: Two times a day (BID) | ORAL | 1 refills | Status: DC

## 2016-09-17 MED ORDER — TEMAZEPAM 30 MG PO CAPS: 30.0000 mg | ORAL_CAPSULE | Freq: Every day | ORAL | 0 refills | Status: DC

## 2016-09-17 MED ORDER — FLUVOXAMINE MALEATE 100 MG PO TABS: 100.0000 mg | ORAL_TABLET | Freq: Every day | ORAL | 1 refills | Status: DC

## 2016-09-17 MED ORDER — DICYCLOMINE HCL 20 MG PO TABS: 20.0000 mg | ORAL_TABLET | Freq: Three times a day (TID) | ORAL | 1 refills | Status: DC

## 2016-09-17 NOTE — Progress Notes (Addendum)
Visible in the Day Room, appropriate to circumstances, A&Ox3, well groomed, declared that she had a good day, "I initially slept a lot, was bored but participated in the Painting Group, helped with clean up after the group ... I have an autistic 32 year old son, that's the reason for my tattoo in the right palm, I will never attempt suicide again, it is hard as we speak, it will be harder without a mother..." Patient was encouraged to continue to verbalize feelings to alleviate burdens and to enable her to problem solve, motivates her to tap into her inner strengths. C/o possible reactions to flu shot because "my right face feels numbed when I woke up .Marland Kitchen.Marland Kitchen.Marland Kitchen."

## 2016-09-17 NOTE — Plan of Care (Signed)
Problem: [facility] Participation in Recreation Therapeutic Interventions Goal: STG-Patient will identify at least five coping skills for ** STG: Coping Skills - Within 3 treatment sessions, patient will verbalize at least 10 coping skills in one treatment session to increase healthy coping skills post d/c.  Outcome: Completed/Met Date Met: 09/17/16 Treatment Session 1; Completed 1 out of 1: At approximately 8:20 am, LRT met with patient in craft room. Patient verbalized 56 coping skills. LRT encouraged patient to continue thinking of coping skills and ot use them post d/c.  Leonette Monarch, LRT/CTRS 12.08.17 2:08 pm Goal: STG-Other Recreation Therapy Goal (Specify) STG: Stress Management - Within 3 treatment sessions, patient will verbalize understanding of the stress management techniques in one treatment session to increase stress management skills post d/c.  Outcome: Completed/Met Date Met: 09/17/16 Treatment Session 1; Completed 1 out of 1: At approximately 8:20 pm, LRT met with patient in craft room. LRT educated and provided patient with handouts on stress management techniques. Patient verbalized understanding. LRT encouraged patient to read over and practice the stress management techniques.  Leonette Monarch, LRT/CTRS 12.08.17 2:09 pm

## 2016-09-17 NOTE — Tx Team (Signed)
Interdisciplinary Treatment and Diagnostic Plan Update  09/17/2016 Time of Session: 10:00am Brittany [name] MRN: [id number]  Principal Diagnosis: Bipolar I disorder, most recent episode depressed (HCC)  Secondary Diagnoses: Principal Problem:   Bipolar I disorder, most recent episode depressed (HCC) Active Problems:   Overdose   Borderline personality disorder   Cannabis use disorder, severe, dependence (HCC)   ADHD (attention deficit hyperactivity disorder)   Current Medications:  Current Facility-Administered Medications  Medication Dose Route Frequency Provider Last Rate Last Dose  . acetaminophen (TYLENOL) tablet 650 mg  650 mg Oral Q6H PRN Jolanta B Pucilowska, Brittany [name]      . ALPRAZolam Prudy Feeler(XANAX) tablet 1 mg  1 mg Oral TID AC Jolanta B Pucilowska, Brittany [name]   1 mg at 09/17/16 0902  . alum & mag hydroxide-simeth (MAALOX/MYLANTA) 200-200-20 MG/5ML suspension 30 mL  30 mL Oral Q4H PRN Jolanta B Pucilowska, Brittany [name]      . amphetamine-dextroamphetamine (ADDERALL XR) 24 hr capsule 30 mg  30 mg Oral Daily Jolanta B Pucilowska, Brittany [name]   30 mg at 09/17/16 0901  . dicyclomine (BENTYL) tablet 20 mg  20 mg Oral TID AC & HS Jolanta B Pucilowska, Brittany [name]   20 mg at 09/17/16 0902  . fluvoxaMINE (LUVOX) tablet 100 mg  100 mg Oral QHS Shari ProwsJolanta B Pucilowska, Brittany [name]   100 mg at 09/16/16 2115  . loperamide (IMODIUM) capsule 4 mg  4 mg Oral Q breakfast Jolanta B Pucilowska, Brittany [name]   4 mg at 09/17/16 0901  . magnesium hydroxide (MILK OF MAGNESIA) suspension 30 mL  30 mL Oral Daily PRN Jolanta B Pucilowska, Brittany [name]      . menthol-cetylpyridinium (CEPACOL) lozenge 3 mg  1 lozenge Oral PRN Shari ProwsJolanta B Pucilowska, Brittany [name]   3 mg at 09/17/16 0259  . prazosin (MINIPRESS) capsule 2 mg  2 mg Oral BID Shari ProwsJolanta B Pucilowska, Brittany [name]   2 mg at 09/17/16 0904  . pseudoephedrine (SUDAFED) 12 hr tablet 120 mg  120 mg Oral BID Shari ProwsJolanta B Pucilowska, Brittany [name]   120 mg at 09/17/16 0905  . temazepam (RESTORIL) capsule 30 mg  30 mg Oral QHS Shari ProwsJolanta B Pucilowska, Brittany [name]   30 mg at  09/16/16 2115  . topiramate (TOPAMAX) tablet 100 mg  100 mg Oral QHS Jolanta B Pucilowska, Brittany [name]   100 mg at 09/16/16 2117  . ziprasidone (GEODON) capsule 40 mg  40 mg Oral BID WC Jolanta B Pucilowska, Brittany [name]   40 mg at 09/17/16 0902   PTA Medications: Prescriptions Prior to Admission  Medication Sig Dispense Refill Last Dose  . ALPRAZolam (XANAX) 1 MG tablet Take 1 tablet (1 mg total) by mouth 3 (three) times daily. 60 tablet 0   . amphetamine-dextroamphetamine (ADDERALL) 30 MG tablet Take 30 mg by mouth daily.   unknown at unknown  . doxylamine, Sleep, (UNISOM) 25 MG tablet Take 25 mg by mouth at bedtime as needed for sleep. Reported on 02/16/2016   unknown at unknown  . lithium 300 MG tablet Take 300 mg by mouth every morning. Reported on 12/15/2015   unknown at unknown  . lithium carbonate (LITHOBID) 300 MG CR tablet Take 1 tablet (300 mg total) by mouth every 12 (twelve) hours. 60 tablet 1   . traZODone (DESYREL) 100 MG tablet Take 2 tablets (200 mg total) by mouth at bedtime. 30 tablet 1     Patient Stressors: Financial difficulties Health problems Marital or family conflict  Patient Strengths: Ability for insight Average or above average intelligence Motivation for treatment/growth Supportive family/friends  Treatment Modalities: Medication Management, Group therapy, Case management,  1 to 1 session with clinician, Psychoeducation, Recreational therapy.   Physician Treatment Plan for Primary Diagnosis: Bipolar I disorder, most recent episode depressed (HCC) Long Term Goal(s): Improvement in symptoms so as ready for discharge Improvement in symptoms so as ready for discharge   Short Term Goals: Ability to identify changes in lifestyle to reduce recurrence of condition will improve Ability to verbalize feelings will improve Ability to disclose and discuss suicidal ideas Ability to demonstrate self-control will improve Ability to identify and develop effective coping behaviors will  improve Ability to maintain clinical measurements within normal limits will improve Compliance with prescribed medications will improve Ability to identify changes in lifestyle to reduce recurrence of condition will improve Ability to demonstrate self-control will improve  Medication Management: Evaluate patient's response, side effects, and tolerance of medication regimen.  Therapeutic Interventions: 1 to 1 sessions, Unit Group sessions and Medication administration.  Evaluation of Outcomes: Adequate for Discharge  Physician Treatment Plan for Secondary Diagnosis: Principal Problem:   Bipolar I disorder, most recent episode depressed (HCC) Active Problems:   Overdose   Borderline personality disorder   Cannabis use disorder, severe, dependence (HCC)   ADHD (attention deficit hyperactivity disorder)  Long Term Goal(s): Improvement in symptoms so as ready for discharge Improvement in symptoms so as ready for discharge   Short Term Goals: Ability to identify changes in lifestyle to reduce recurrence of condition will improve Ability to verbalize feelings will improve Ability to disclose and discuss suicidal ideas Ability to demonstrate self-control will improve Ability to identify and develop effective coping behaviors will improve Ability to maintain clinical measurements within normal limits will improve Compliance with prescribed medications will improve Ability to identify changes in lifestyle to reduce recurrence of condition will improve Ability to demonstrate self-control will improve     Medication Management: Evaluate patient's response, side effects, and tolerance of medication regimen.  Therapeutic Interventions: 1 to 1 sessions, Unit Group sessions and Medication administration.  Evaluation of Outcomes: Adequate for Discharge   Brittany [name] Treatment Plan for Primary Diagnosis: Bipolar I disorder, most recent episode depressed (HCC) Long Term Goal(s): Knowledge of disease and  therapeutic regimen to maintain health will improve  Short Term Goals: Ability to remain free from injury will improve, Ability to verbalize feelings will improve, Ability to disclose and discuss suicidal ideas, Ability to identify and develop effective coping behaviors will improve and Compliance with prescribed medications will improve  Medication Management: Brittany [name] will administer medications as ordered by provider, will assess and evaluate patient's response and provide education to patient for prescribed medication. Brittany [name] will report any adverse and/or side effects to prescribing provider.  Therapeutic Interventions: 1 on 1 counseling sessions, Psychoeducation, Medication administration, Evaluate responses to treatment, Monitor vital signs and CBGs as ordered, Perform/monitor CIWA, COWS, AIMS and Fall Risk screenings as ordered, Perform wound care treatments as ordered.  Evaluation of Outcomes: Adequate for Discharge   [name] Treatment Plan for Primary Diagnosis: Bipolar I disorder, most recent episode depressed (HCC) Long Term Goal(s): Safe transition to appropriate next level of care at discharge, Engage patient in therapeutic group addressing interpersonal concerns.  Short Term Goals: Engage patient in aftercare planning with referrals and resources, Increase social support, Increase ability to appropriately verbalize feelings, Increase emotional regulation, Facilitate acceptance of mental health diagnosis and concerns, Identify triggers associated with mental health/substance abuse issues and Increase skills for wellness and recovery  Therapeutic Interventions: Assess for all discharge needs, 1 to  1 time with Child psychotherapistocial worker, Explore available resources and support systems, Assess for adequacy in community support network, Educate family and significant other(s) on suicide prevention, Complete Psychosocial Assessment, Interpersonal group therapy.  Evaluation of Outcomes: Adequate for  Discharge   Progress in Treatment: Attending groups: Yes. Participating in groups: Yes. Taking medication as prescribed: Yes. Toleration medication: Yes. Family/Significant other contact made: Yes, individual(s) contacted:  Brittany [name] Patient understands diagnosis: Yes. Discussing patient identified problems/goals with staff: Yes. Medical problems stabilized or resolved: Yes. Denies suicidal/homicidal ideation: Yes. Issues/concerns per patient self-inventory: No. Other: n/a  New problem(s) identified: None identified at this time.   New Short Term/Long Term Goal(s): None identified at this time.   Discharge Plan or Barriers: Patient will discharge home and has follow-[place] [facility] [place] and [facility] [place].   Reason for Continuation of Hospitalization: Anticipated discharge 09/17/2016  Estimated Length of Stay: Anticipated discharge 09/17/2016  Attendees: Patient: Brittany [name] 09/17/2016 10:26 AM  Physician: Dr. Jennet MaduroPucilowska, Brittany [name] 09/17/2016 10:26 AM  Nursing: Brittany PaddyJennifer Morrow, Brittany [name] 09/17/2016 10:26 AM  Brittany [name] Care Manager: 09/17/2016 10:26 AM  Social Worker: Brittany BirksAmaris G. Garnette CzechSampson [name], Brittany [name] 09/17/2016 10:26 AM  Recreational Therapist: Jacquelynn CreeElizabeth M. [name], Brittany [name] 09/17/2016 10:26 AM  Other:  09/17/2016 10:26 AM  Other:  09/17/2016 10:26 AM  Other: 09/17/2016 10:26 AM    Scribe for Treatment Team: Brittany LongestAmaris G Eyoel Throgmorton, Brittany [name] 09/17/2016 10:29 AM

## 2016-09-17 NOTE — Plan of Care (Signed)
Problem: Physical Regulation: Goal: Will remain free from infection Outcome: Progressing Handwashing encouraged, patient slept for Estimated Hours of 6.45; safety maintained, no injury or falls during this shift.

## 2016-09-17 NOTE — Progress Notes (Addendum)
Patient with appropriate affect, cooperative behavior with meals, meds and plan of care. MD and social worker into visit. Patient to discharge today when discharge plan of care complete. Safety maintained. Patient discharged at this time, husband arrives for transportation in private car. Verbalizes understanding rt recommended discharge plan of care. Acknowledges all belongings have been returned. No SI/HI, safety maintained.

## 2016-09-17 NOTE — Plan of Care (Signed)
Problem: Safety: Goal: Ability to disclose and discuss suicidal ideas will improve Outcome: Progressing Medications compliant, Lozenge 3 mg PRN given sore throat that patient stated, perhaps, related to flu shot; 15 minute checks maintained for safety, clinical and moral support provided, patient encouraged to continue to express feelings and demonstrate safe care. Patient remains free from harm, will continue to monitor.

## 2016-09-17 NOTE — Progress Notes (Signed)
Recreation Therapy Notes  INPATIENT RECREATION TR PLAN  Patient Details Name: Brittany [name] MRN: [id number] DOB: Dec 12, 1985 Today's Date: 09/17/2016  Rec Therapy Plan Is patient appropriate for Therapeutic Recreation?: Yes Treatment times per week: At least once a week TR Treatment/Interventions: 1:1 session, Group participation (Comment) (Appropriate participation in daily recreational therapy tx)  Discharge Criteria Pt will be discharged from therapy if:: Treatment goals are met, Discharged Treatment plan/goals/alternatives discussed and agreed upon by:: Patient/family  Discharge Summary Short term goals set: See Care Plan Short term goals met: Complete Progress toward goals comments: One-to-one attended Which groups?: Leisure education One-to-one attended: Coping Skills, Stress Management Reason goals not met: N/A Therapeutic equipment acquired: None Reason patient discharged from therapy: Discharge from hospital Pt/family agrees with progress & goals achieved: Yes Date patient discharged from therapy: 09/17/16   Leonette Monarch, LRT/CTRS 09/17/2016, 2:11 PM
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
## 2016-09-17 NOTE — Progress Notes (Signed)
  [facility] :  Will you be returning to the same living situation after discharge:  Yes,  home with husband At discharge, do you have transportation home?: Yes,  husband Do you have the ability to pay for your medications: Yes,  patient has insurance  Release of information consent forms completed and in the chart;  Patient's signature needed at discharge.  Patient to Follow up at: Follow-up [facility]. Go in 7 day(s).   Specialty:  Professional Counselor Why:  [facility] services during walk-in hours M-F 8:30a-12p & 1:00p-2:30p. Please bring insurance card, I.D., current medications, and discharge summary.  Contact information: [facility] [address] [place] [address] [phone]        [facility] Go in [facility] day([facility]).   Why:  Please follow-up with [facility] for medication management within 7 days of discharge. Walk-in hours for new patients is M-F 8a-3p. Arrive early to ensure prompt services. Please bring insurance info, I.D., current medications, and discharge summary Contact information: [address] [place] [address] [phone]           Next level of care provider [facility] Link:no  Safety Planning and Suicide Prevention discussed: Yes,  with patient and husband  Have you used any form of tobacco in the last 30 days? (Cigarettes, Smokeless Tobacco, Cigars, and/or Pipes): No  Has patient been referred to the Quitline?: N/A patient is not a smoker  Patient has been referred for addiction treatment: Yes  Raquon Milledge G. Garnette CzechSampson MSW, LCSWA 09/17/2016, 10:30 AM

## 2016-09-17 NOTE — [facility] Suicide Risk Assessment (Signed)
[facility]   Principal Problem: Bipolar I disorder, most recent episode depressed [facility](HCC) Discharge Diagnoses:  Patient Active Problem List   Diagnosis Date Noted  . ADHD (attention deficit hyperactivity disorder) [F90.9] 09/15/2016  . Bipolar I disorder, most recent episode depressed (HCC) [F31.30] 09/15/2016  . Borderline personality disorder [F60.3] 05/04/2016  . Cannabis use disorder, severe, dependence (HCC) [F12.20] 05/04/2016  . Overdose [T50.901A] 05/03/2016  . Noncompliance [Z91.19] 05/03/2016    Total Time spent with patient: 30 minutes  Musculoskeletal: Strength & Muscle Tone: within normal limits Gait & Station: normal Patient leans: N/A  Psychiatric Specialty Exam: Review of Systems  Psychiatric/Behavioral: The patient is nervous/anxious and has insomnia.   All other systems reviewed and are negative.   Blood pressure 127/82, pulse 84, temperature 97.7 F (36.5 C), temperature source Oral, resp. rate 20, height 5\' 4"  (1.626 m), weight 112 kg (247 lb), SpO2 99 %.Body mass index is 42.4 kg/m.  General Appearance: Casual  Eye Contact::  Good  Speech:  Clear and Coherent409  Volume:  Normal  Mood:  Anxious  Affect:  Appropriate  Thought Process:  Goal Directed and Descriptions of Associations: Intact  Orientation:  Full (Time, Place, and Person)  Thought Content:  WDL  Suicidal Thoughts:  No  Homicidal Thoughts:  No  Memory:  Immediate;   Fair Recent;   Fair Remote;   Fair  Judgement:  Impaired  Insight:  Fair  Psychomotor Activity:  Normal  Concentration:  Fair  Recall:  [employer]Fair  Fund of Knowledge:Fair  Language: Fair  Akathisia:  No  Handed:  Right  AIMS (if indicated):     Assets:  Communication Skills Desire for Improvement Financial Resources/Insurance Housing Intimacy Physical Health Resilience Social Support Transportation  Sleep:  Number of Hours: 6.45  Cognition: WNL  ADL's:  Intact   Mental Status Per Nursing  Assessment::   On Admission:  Suicidal ideation indicated by others  Demographic Factors:  Caucasian and Unemployed  Loss Factors: NA  Historical Factors: Prior suicide attempts, Family history of mental illness or substance abuse and Impulsivity  Risk Reduction Factors:   Responsible for children under 32 years of age, Sense of responsibility to family, Living with another person, especially a relative, Positive social support and Positive therapeutic relationship  Continued Clinical Symptoms:  Bipolar Disorder:   Depressive phase Depression:   Impulsivity Insomnia Obsessive-Compulsive Disorder  Cognitive Features That Contribute To Risk:  None    Suicide Risk:  Minimal: No identifiable suicidal ideation.  Patients presenting with no risk factors but with morbid ruminations; may be classified as minimal risk based on the severity of the depressive symptoms  Follow-up Information    [facility] Go in 7 day(s).   Specialty:  Professional Counselor Why:  [facility] services during walk-in hours M-F 8:30a-12p & 1:00p-2:30p. Please bring insurance card, I.D., current medications, and discharge summary.  Contact information: [facility] [address] [place] [address] [phone]           Plan Of Care/Follow-up recommendations:  Activity:  As tolerated. Diet:  Low sodium heart healthy. Other:  Keep follow-up appointments.  Kristine LineaJolanta Laquonda Welby, MD 09/17/2016, 10:19 AM

## 2016-09-17 NOTE — BHH Group Notes (Signed)
[facility] [name] Group Therapy   09/17/2016 9:30 AM   Type of Therapy: Group Therapy   Participation Level: Active   Participation Quality: Attentive, Sharing and Supportive   Affect: Appropriate   Cognitive: Alert and Oriented   Insight: Developing/Improving and Engaged   Engagement in Therapy: Developing/Improving and Engaged   Modes of Intervention: Clarification, Confrontation, Discussion, Education, Exploration, Limit-setting, Orientation, Problem-solving, Rapport Building, Dance movement psychotherapisteality Testing, Socialization and Support   Summary of Progress/Problems: The topic for today was feelings about relapse. Pt discussed what relapse prevention is to them and identified triggers that they are on the path to relapse. Pt processed their feeling towards relapse and was able to relate to peers. Pt discussed coping skills that can be used for relapse prevention. Pt defined relapse as "doing well for a period of time but then having a setback." She identified symptoms such as sleeping too much, racing thoughts, and withdrawing from family/friends as warning signs of a potential relapse. Pt stated to reduce exposure to relapse, she will continue taking medications and follow-up with outpatient providers.  Hampton AbbotKadijah Loda [name], MSW, LCSWA 09/17/2016, 10:33AM

## 2016-12-08 ENCOUNTER — Other Ambulatory Visit: Payer: Self-pay | Admitting: Psychiatry

## 2016-12-08 NOTE — Progress Notes (Signed)
I have attempted to contact this patient several times today by telephone. The phone number was at first busy but more recently I called and no one picked up and there was no opportunity to leave a voicemail. I will continue attempting to return her call.

## 2016-12-13 ENCOUNTER — Telehealth: Payer: Self-pay

## 2016-12-20 ENCOUNTER — Other Ambulatory Visit: Payer: Self-pay | Admitting: Psychiatry

## 2016-12-20 ENCOUNTER — Ambulatory Visit
Admission: RE | Admit: 2016-12-20 | Discharge: 2016-12-20 | Disposition: A | Discharge status: Home / Self Care | Payer: Medicaid Other | Source: Ambulatory Visit | Attending: Psychiatry | Admitting: Psychiatry

## 2016-12-20 ENCOUNTER — Encounter: Payer: Self-pay | Admitting: Anesthesiology

## 2016-12-20 DIAGNOSIS — F3189 Other bipolar disorder: Secondary | ICD-10-CM | POA: Insufficient documentation

## 2016-12-20 DIAGNOSIS — Z818 Family history of other mental and behavioral disorders: Secondary | ICD-10-CM | POA: Insufficient documentation

## 2016-12-20 DIAGNOSIS — M1990 Unspecified osteoarthritis, unspecified site: Secondary | ICD-10-CM | POA: Insufficient documentation

## 2016-12-20 DIAGNOSIS — F339 Major depressive disorder, recurrent, unspecified: Secondary | ICD-10-CM | POA: Diagnosis present

## 2016-12-20 DIAGNOSIS — F333 Major depressive disorder, recurrent, severe with psychotic symptoms: Secondary | ICD-10-CM | POA: Diagnosis not present

## 2016-12-20 DIAGNOSIS — R51 Headache: Secondary | ICD-10-CM | POA: Diagnosis not present

## 2016-12-20 DIAGNOSIS — Z888 Allergy status to other drugs, medicaments and biological substances status: Secondary | ICD-10-CM | POA: Insufficient documentation

## 2016-12-20 DIAGNOSIS — Z9049 Acquired absence of other specified parts of digestive tract: Secondary | ICD-10-CM | POA: Insufficient documentation

## 2016-12-20 DIAGNOSIS — Z87891 Personal history of nicotine dependence: Secondary | ICD-10-CM | POA: Insufficient documentation

## 2016-12-20 DIAGNOSIS — F332 Major depressive disorder, recurrent severe without psychotic features: Secondary | ICD-10-CM

## 2016-12-20 DIAGNOSIS — F419 Anxiety disorder, unspecified: Secondary | ICD-10-CM | POA: Insufficient documentation

## 2016-12-20 DIAGNOSIS — Z87442 Personal history of urinary calculi: Secondary | ICD-10-CM | POA: Insufficient documentation

## 2016-12-20 DIAGNOSIS — F1290 Cannabis use, unspecified, uncomplicated: Secondary | ICD-10-CM | POA: Diagnosis not present

## 2016-12-20 DIAGNOSIS — F909 Attention-deficit hyperactivity disorder, unspecified type: Secondary | ICD-10-CM | POA: Diagnosis not present

## 2016-12-20 MED ORDER — KETOROLAC TROMETHAMINE 30 MG/ML IJ SOLN
30.0000 mg | Freq: Once | INTRAMUSCULAR | Status: AC
  Administered 2016-12-20: 30 mg via INTRAVENOUS

## 2016-12-20 MED ORDER — ONDANSETRON HCL 4 MG/2ML IJ SOLN
INTRAMUSCULAR | Status: AC
  Administered 2016-12-20: 4 mg via INTRAVENOUS
  Filled 2016-12-20: qty 2

## 2016-12-20 MED ORDER — SUCCINYLCHOLINE CHLORIDE 20 MG/ML IJ SOLN
INTRAMUSCULAR | Status: AC
  Filled 2016-12-20: qty 1

## 2016-12-20 MED ORDER — MIDAZOLAM HCL 2 MG/2ML IJ SOLN
INTRAMUSCULAR | Status: DC | PRN
  Administered 2016-12-20: 2 mg via INTRAVENOUS

## 2016-12-20 MED ORDER — SODIUM CHLORIDE 0.9 % IV SOLN
500.0000 mL | Freq: Once | INTRAVENOUS | Status: AC
  Administered 2016-12-20: 1000 mL via INTRAVENOUS

## 2016-12-20 MED ORDER — MIDAZOLAM HCL 2 MG/2ML IJ SOLN
INTRAMUSCULAR | Status: AC
  Filled 2016-12-20: qty 2

## 2016-12-20 MED ORDER — METHOHEXITAL SODIUM 100 MG/10ML IV SOSY
PREFILLED_SYRINGE | INTRAVENOUS | Status: DC | PRN
  Administered 2016-12-20: 100 mg via INTRAVENOUS
  Administered 2016-12-20: 120 mg via INTRAVENOUS

## 2016-12-20 MED ORDER — HALOPERIDOL LACTATE 5 MG/ML IJ SOLN
INTRAMUSCULAR | Status: AC
  Filled 2016-12-20: qty 1

## 2016-12-20 MED ORDER — ONDANSETRON HCL 4 MG/2ML IJ SOLN: 4.0000 mg | Freq: Once | INTRAMUSCULAR | Status: DC | PRN

## 2016-12-20 MED ORDER — METHOHEXITAL SODIUM 0.5 G IJ SOLR
INTRAMUSCULAR | Status: AC
  Filled 2016-12-20: qty 500

## 2016-12-20 MED ORDER — KETOROLAC TROMETHAMINE 30 MG/ML IJ SOLN
INTRAMUSCULAR | Status: AC
  Filled 2016-12-20: qty 1

## 2016-12-20 MED ORDER — FENTANYL CITRATE (PF) 100 MCG/2ML IJ SOLN: 25.0000 ug | INTRAMUSCULAR | Status: DC | PRN

## 2016-12-20 MED ORDER — ONDANSETRON HCL 4 MG/2ML IJ SOLN
4.0000 mg | Freq: Once | INTRAMUSCULAR | Status: AC
  Administered 2016-12-20: 4 mg via INTRAVENOUS

## 2016-12-20 MED ORDER — SODIUM CHLORIDE 0.9 % IV SOLN
INTRAVENOUS | Status: DC | PRN
  Administered 2016-12-20: 10:00:00 via INTRAVENOUS

## 2016-12-20 MED ORDER — HALOPERIDOL LACTATE 5 MG/ML IJ SOLN
INTRAMUSCULAR | Status: DC | PRN
  Administered 2016-12-20: 5 mg via INTRAVENOUS

## 2016-12-20 NOTE — Anesthesia Postprocedure Evaluation (Signed)
Anesthesia Post Note  Patient: Brittany [name]  Procedure(s) Performed: * No procedures listed *  Patient location during evaluation: PACU Anesthesia Type: General Level of consciousness: awake and alert and oriented Pain management: pain level controlled Vital Signs Assessment: post-procedure vital signs reviewed and stable Respiratory status: spontaneous breathing Cardiovascular status: blood pressure returned to baseline Anesthetic complications: no     Last Vitals:  Vitals:   12/20/16 1125 12/20/16 1132  BP: 105/80 110/66  Pulse: 87 92  Resp: 16 16  Temp: 36.1 C     Last Pain:  Vitals:   12/20/16 1132  TempSrc: Oral  PainSc:                  [name]

## 2016-12-20 NOTE — Procedures (Signed)
[facility] SERVICES Physician's Interval Evaluation & Treatment Note  Patient Identification: Brittany [name] MRN:  [id number] Date of Evaluation:  12/20/2016 TX #: 1  MADRS: 31  MMSE: 30  P.E. Findings:  Heart and lungs normal vitals unremarkable  Psychiatric Interval Note:  Mood depressed but lucid and nonpsychotic  Subjective:  Patient is a 31 y.o. female seen for evaluation for Electroconvulsive Therapy. Feeling depressed no suicidal intent  Treatment Summary:   [x]   Right Unilateral             []  Bilateral   % Energy : 0.3 ms 60%   Impedance: 1900 ohms  Seizure Energy Index: 14,318 V squared  Postictal Suppression Index: 47%  Seizure Concordance Index: 85%  Medications  Pre Shock: Zofran 4 mg Toradol 30 mg Brevital 100 mg succinylcholine 120 mg  Post Shock: Versed 2 mg Haldol 5 mg  Seizure Duration: 36 seconds by EMG 36 seconds by EEG   Comments: Follow-up Wednesday and Friday   Lungs:  [x]   Clear to auscultation               []  Other:   Heart:    [x]   Regular rhythm             []  irregular rhythm    [x]   Previous H&P reviewed, patient examined and there are NO CHANGES                 []   Previous H&P reviewed, patient examined and there are changes noted.   Mordecai RasmussenJohn Feleica Fulmore, MD 3/12/201810:17 AM

## 2016-12-20 NOTE — Transfer of Care (Signed)
Immediate Anesthesia Transfer of Care Note  Patient: Brittany [name]  Procedure(s) Performed: * No procedures listed *  Patient Location: PACU  Anesthesia Type:General  Level of Consciousness: sedated  Airway & Oxygen Therapy: Patient Spontanous Breathing and Patient connected to face mask oxygen  Post-op Assessment: Report given to RN and Post -op Vital signs reviewed and stable  Post vital signs: Reviewed and stable  Last Vitals:  Vitals:   12/20/16 0901  BP: (!) 134/96  Temp: 36.4 C    Last Pain:  Vitals:   12/20/16 0901  TempSrc: Oral         Complications: No apparent anesthesia complications

## 2016-12-20 NOTE — Anesthesia Preprocedure Evaluation (Signed)
Anesthesia Evaluation  Patient identified by MRN, date of birth, ID band Patient awake    Reviewed: Allergy & Precautions, H&P , NPO status , Patient's Chart, lab work & pertinent test results, reviewed documented beta blocker date and time   Airway Mallampati: II  TM Distance: <3 FB Neck ROM: full    Dental  (+) Poor Dentition   Pulmonary neg pulmonary ROS, former smoker,    Pulmonary exam normal        Cardiovascular negative cardio ROS Normal cardiovascular exam Rate:Normal     Neuro/Psych  Headaches, PSYCHIATRIC DISORDERS negative neurological ROS  negative psych ROS   GI/Hepatic negative GI ROS, Neg liver ROS,   Endo/Other  negative endocrine ROS  Renal/GU Renal diseasenegative Renal ROS  negative genitourinary   Musculoskeletal  (+) Arthritis , Osteoarthritis,    Abdominal   Peds negative pediatric ROS (+)  Hematology negative hematology ROS (+)   Anesthesia Other Findings Past Medical History: No date: ADHD (attention deficit hyperactivity disorder) No date: Anxiety No date: Arthritis No date: Bipolar disorder (HCC) No date: Bipolar I disorder, most recent episode depres* No date: Chronic kidney disease     Comment: Kidney stones No date: Depression No date: Headache Past Surgical History: No date: CESAREAN SECTION No date: FOOT SURGERY No date: KIDNEY SURGERY 2010: NOVASURE ABLATION No date: SHOULDER SURGERY Right No date: TUBAL LIGATION BMI    Body Mass Index:  42.40 kg/m     Reproductive/Obstetrics                             Anesthesia Physical  Anesthesia Plan  ASA: III  Anesthesia Plan: General   Post-op Pain Management:    Induction: Intravenous  Airway Management Planned: Mask  Additional Equipment:   Intra-op Plan:   Post-operative Plan:   Informed Consent: I have reviewed the patients History and Physical, chart, labs and discussed the  procedure including the risks, benefits and alternatives for the proposed anesthesia with the patient or authorized representative who has indicated his/her understanding and acceptance.   Dental Advisory Given  Plan Discussed with: CRNA  Anesthesia Plan Comments:         Anesthesia Quick Evaluation

## 2016-12-20 NOTE — H&P (Signed)
Brittany [name] is an 31 y.o. female.   Chief Complaint: Return of severe depressed mood and hopelessness HPI: History of recurrent depression and mood instability mood swings  Past Medical History:  Diagnosis Date  . ADHD (attention deficit hyperactivity disorder)   . Anxiety   . Arthritis   . Bipolar disorder (HCC)   . Bipolar I disorder, most recent episode depressed (HCC)   . Chronic kidney disease    Kidney stones  . Depression   . Headache     Past Surgical History:  Procedure Laterality Date  . CESAREAN SECTION    . CHOLECYSTECTOMY  2017  . FOOT SURGERY    . KIDNEY SURGERY    . NOVASURE ABLATION  2010  . SHOULDER SURGERY Right   . TUBAL LIGATION      Family History  Problem Relation Age of Onset  . Anxiety disorder Mother   . Depression Mother   . Drug abuse Mother   . Bipolar disorder Mother   . Depression Father   . Anxiety disorder Father   . Drug abuse Father   . Anxiety disorder Sister   . Depression Sister   . ADD / ADHD Sister   . Anxiety disorder Sister   . Depression Sister    Social History:  reports that she has quit smoking. Her smoking use included Cigarettes. She has never used smokeless tobacco. She reports that she drinks about 2.4 - 7.2 oz of alcohol per week . She reports that she uses drugs, including Marijuana.  Allergies:  Allergies  Allergen Reactions  . Ceclor [Cefaclor] Hives and Nausea And Vomiting    "vomiting"     (Not in a hospital admission)  No results found for this or any previous visit (from the past 48 hour(s)). No results found.  Review of Systems  Constitutional: Negative.   HENT: Negative.   Eyes: Negative.   Respiratory: Negative.   Cardiovascular: Negative.   Gastrointestinal: Negative.   Musculoskeletal: Negative.   Skin: Negative.   Neurological: Negative.   Psychiatric/Behavioral: Positive for depression and suicidal ideas. Negative for hallucinations, memory loss and substance abuse. The patient is  nervous/anxious and has insomnia.     Blood pressure (!) 134/96, temperature 97.6 F (36.4 C), temperature source Oral, height 5\' 4"  (1.626 m), weight 109.8 kg (242 lb), SpO2 96 %. Physical Exam  Nursing note and vitals reviewed. Constitutional: She appears well-developed and well-nourished.  HENT:  Head: Normocephalic and atraumatic.  Eyes: Conjunctivae are normal. Pupils are equal, round, and reactive to light.  Neck: Normal range of motion.  Cardiovascular: Regular rhythm and normal heart sounds.   Respiratory: Effort normal. No respiratory distress.  GI: Soft.  Musculoskeletal: Normal range of motion.  Neurological: She is alert.  Skin: Skin is warm and dry.  Psychiatric: Judgment normal. Her speech is delayed. She is slowed. Cognition and memory are normal. She exhibits a depressed mood. She expresses suicidal ideation. She expresses no suicidal plans.     Assessment/Plan Treatment today follow-up Monday Wednesday and Friday treatment plan.  Mordecai RasmussenJohn Clapacs, MD 12/20/2016, 10:15 AM

## 2016-12-20 NOTE — Discharge Instructions (Signed)
1)  The drugs that you have been given will stay in your system until tomorrow so for the       next 24 hours you should not:  A. Drive an automobile  B. Make any legal decisions  C. Drink any alcoholic beverages  2)  You may resume your regular meals upon return home.  3)  A responsible adult must take you home.  Someone should stay with you for a few          hours, then be available by phone for the remainder of the treatment day.  4)  You May experience any of the following symptoms:  Headache, Nausea and a dry mouth (due to the medications you were given),  temporary memory loss and some confusion, or sore muscles (a warm bath  should help this).  If you you experience any of these symptoms let us know on                your return visit.  5)  Report any of the following: any acute discomfort, severe headache, or temperature        greater than 100.5 F.   Also report any unusual redness, swelling, drainage, or pain         at your IV site.    You may report Symptoms to:  ECT PROGRAM[facility]          Phone: [phone], ECT Department           or Dr. Shary Keylapac's office [phone]  6)  Your next ECT Treatment is Day Friday March 16th  at 830am is scheduled appointment for arrival times.  7)  Nothing to eat or drink after midnight the night before your procedure.  8)  Take .     With a sip of water the morning of your procedure.  9)  Other Instructions: Call [phone] to cancel the morning of your procedure due         to illness or emergency.  10) We will call within 72 hours to assess how you are feeling.

## 2016-12-20 NOTE — Anesthesia Post-op Follow-up Note (Cosign Needed)
Anesthesia QCDR form completed.        

## 2016-12-24 ENCOUNTER — Encounter: Payer: Self-pay | Admitting: Anesthesiology

## 2016-12-24 ENCOUNTER — Other Ambulatory Visit: Payer: Self-pay | Admitting: Psychiatry

## 2016-12-24 ENCOUNTER — Inpatient Hospital Stay: Admission: RE | Admit: 2016-12-24 | Payer: Self-pay | Source: Ambulatory Visit

## 2017-06-07 NOTE — BH Assessment (Signed)
Tele Assessment Note   Patient Name: Brittany [name] MRN: [id number] Referring Physician: Galen Daft, DO Location of Patient: [facility] Location of Provider: Behavioral Health TTS Department  Brittany [name] is a 31 y.o. female who presented voluntarily to [facility]. Pt reports SI secondary to changing medications from Lexapro to Trintellix @4  days ago. Pt presents with labile mood and hyperverbal, tangential speech. Pt shares that she has had 4 hours of sleep within the last 4 days. Periodically throughout the assessment, when pt would stop talking, she appeared to be nodding off to sleep. Pt reports all of the bad thoughts running through her head, to include thoughts of suicide, since the change of her medication. Pt reports taking 6 mg of Xanax a day since age 51. Pt also reports taking Adderall and Trazodone. Pt has no psychiatrist and her meds are prescribed by her PCP.   Diagnosis: Bipolar d/o I, current or most recent episode hypomanic  Past Medical History:  Past Medical History:  Diagnosis Date  . ADHD (attention deficit hyperactivity disorder)   . Anxiety   . Arthritis   . Bipolar disorder (HCC)   . Bipolar I disorder, most recent episode depressed (HCC)   . Chronic kidney disease    Kidney stones  . Depression   . Headache     Past Surgical History:  Procedure Laterality Date  . CESAREAN SECTION    . CHOLECYSTECTOMY  2017  . FOOT SURGERY    . KIDNEY SURGERY    . NOVASURE ABLATION  2010  . SHOULDER SURGERY Right   . TUBAL LIGATION      Family History:  Family History  Problem Relation Age of Onset  . Anxiety disorder Mother   . Depression Mother   . Drug abuse Mother   . Bipolar disorder Mother   . Depression Father   . Anxiety disorder Father   . Drug abuse Father   . Anxiety disorder Sister   . Depression Sister   . ADD / ADHD Sister   . Anxiety disorder Sister   . Depression Sister     Social History:  reports that she has quit  smoking. Her smoking use included Cigarettes. She has never used smokeless tobacco. She reports that she drinks about 2.4 - 7.2 oz of alcohol per week . She reports that she uses drugs, including Marijuana.  Additional Social History:  Alcohol / Drug Use Pain Medications: see PTA meds Prescriptions: see PTA meds Over the Counter: see PTA meds History of alcohol / drug use?: Yes  CIWA:   COWS:    PATIENT STRENGTHS: (choose at least two) Average or above average intelligence Capable of independent living Communication skills Motivation for treatment/growth  Allergies:  Allergies  Allergen Reactions  . Ceclor [Cefaclor] Hives and Nausea And Vomiting    "vomiting"    Home Medications:  No prescriptions prior to admission.    OB/GYN Status:  No LMP recorded. Patient has had an ablation.  General Assessment Data Location of Assessment: [facility] Services TTS Assessment: Out of system Is this a Tele or Face-to-Face Assessment?: Tele Assessment Is this an Initial Assessment or a Re-assessment for this encounter?: Initial Assessment Marital status: Married Is patient pregnant?: No Pregnancy Status: No Living Arrangements: Spouse/significant other, Children Can pt return to current living arrangement?: Yes Admission Status: Voluntary Is patient capable of signing voluntary admission?: Yes Referral Source: Self/Family/Friend Insurance type: Medicaid     Crisis Care Plan Living Arrangements: Spouse/significant other, Children Name of  Psychiatrist: none Name of Therapist: none  Education Status Is patient currently in school?: No  Risk to self with the past 6 months Suicidal Ideation: Yes-Currently Present Has patient been a risk to self within the past 6 months prior to admission? : No Suicidal Intent: Yes-Currently Present Has patient had any suicidal intent within the past 6 months prior to admission? : No Is patient at risk for suicide?: Yes Suicidal Plan?:  Yes-Currently Present Has patient had any suicidal plan within the past 6 months prior to admission? : No Specify Current Suicidal Plan: slit wrists, cause a car wreck Access to Means: Yes Previous Attempts/Gestures: Yes Triggers for Past Attempts: Unknown Intentional Self Injurious Behavior: None Family Suicide History: Unknown Recent stressful life event(s): Other (Comment) Persecutory voices/beliefs?: Yes Depression: Yes Depression Symptoms: Insomnia, Tearfulness, Fatigue, Guilt, Loss of interest in usual pleasures, Feeling worthless/self pity Substance abuse history and/or treatment for substance abuse?: No Suicide prevention information given to non-admitted patients: Not applicable  Risk to Others within the past 6 months Homicidal Ideation: No Does patient have any lifetime risk of violence toward others beyond the six months prior to admission? : No Thoughts of Harm to Others: No Current Homicidal Intent: No Current Homicidal Plan: No Access to Homicidal Means: No History of harm to others?: No Assessment of Violence: None Noted Does patient have access to weapons?: No Criminal Charges Pending?: No Does patient have a court date: No Is patient on probation?: No  Psychosis Hallucinations: None noted Delusions: None noted  Mental Status Report Appearance/Hygiene: Unremarkable Eye Contact: Good Motor Activity: Freedom of movement Speech: Rapid, Tangential Level of Consciousness: Alert Mood: Labile Affect: Appropriate to circumstance Anxiety Level: Moderate Thought Processes: Tangential, Coherent Judgement: Impaired Orientation: Person, Place, Time, Situation Obsessive Compulsive Thoughts/Behaviors: None  Cognitive Functioning Concentration: Decreased Memory: Recent Intact, Remote Intact IQ: Average Insight: Fair Impulse Control: Fair Appetite: Fair Sleep: Decreased Total Hours of Sleep: 0 Vegetative Symptoms: None  ADLScreening [facility] Assessment  Services) Patient's cognitive ability adequate to safely complete daily activities?: Yes Patient able to express need for assistance with ADLs?: Yes Independently performs ADLs?: Yes (appropriate for developmental age)  Prior Inpatient Therapy Prior Inpatient Therapy: Yes  Prior Outpatient Therapy Prior Outpatient Therapy: Yes Prior Therapy Facilty/Provider(s): [facility] Reason for Treatment: bipolar Does patient have an ACCT team?: No Does patient have Intensive In-House Services?  : No Does patient have [facility] services? : No Does patient have P4CC services?: No  ADL Screening (condition at time of admission) Patient's cognitive ability adequate to safely complete daily activities?: Yes Is the patient deaf or have difficulty hearing?: No Does the patient have difficulty seeing, even when wearing glasses/contacts?: No Does the patient have difficulty concentrating, remembering, or making decisions?: No Patient able to express need for assistance with ADLs?: Yes Does the patient have difficulty dressing or bathing?: No Independently performs ADLs?: Yes (appropriate for developmental age) Does the patient have difficulty walking or climbing stairs?: No Weakness of Legs: None Weakness of Arms/Hands: None  Home Assistive Devices/Equipment Home Assistive Devices/Equipment: None    Abuse/Neglect Assessment (Assessment to be complete while patient is alone) Physical Abuse: Denies Verbal Abuse: Denies Sexual Abuse: Denies Exploitation of patient/patient's resources: Denies Self-Neglect: Denies Values / Beliefs Cultural Requests During Hospitalization: None Spiritual Requests During Hospitalization: None   Advance Directives (For Healthcare) Does Patient Have a Medical Advance Directive?: No Would patient like information on creating a medical advance directive?: No - Patient declined    Additional Information 1:1 In  Past 12 Months?: No CIRT Risk: No Elopement Risk: No Does  patient have medical clearance?: Yes     Disposition:  Disposition Initial Assessment Completed for this Encounter: Yes (consulted with Ferne Reus, NP) Disposition of Patient: Inpatient treatment program Type of inpatient treatment program: Adult (pt accepted to 25-2)  This service was provided via telemedicine using a 2-way, interactive audio and video technology.  Names of all persons participating in this telemedicine service and their role in this encounter.   Laddie Aquas 06/07/2017 6:25 PM

## 2017-06-08 ENCOUNTER — Encounter (HOSPITAL_COMMUNITY): Payer: Self-pay | Admitting: *Deleted

## 2017-06-08 ENCOUNTER — Inpatient Hospital Stay (HOSPITAL_COMMUNITY)
Admission: AD | Admit: 2017-06-08 | Discharge: 2017-06-14 | DRG: 885 | Disposition: A | Discharge status: Home / Self Care | Payer: Medicaid Other | Source: Other Acute Inpatient Hospital | Attending: Psychiatry | Admitting: Psychiatry

## 2017-06-08 DIAGNOSIS — F4310 Post-traumatic stress disorder, unspecified: Secondary | ICD-10-CM | POA: Diagnosis present

## 2017-06-08 DIAGNOSIS — F419 Anxiety disorder, unspecified: Secondary | ICD-10-CM | POA: Diagnosis not present

## 2017-06-08 DIAGNOSIS — R112 Nausea with vomiting, unspecified: Secondary | ICD-10-CM

## 2017-06-08 DIAGNOSIS — Z813 Family history of other psychoactive substance abuse and dependence: Secondary | ICD-10-CM | POA: Diagnosis not present

## 2017-06-08 DIAGNOSIS — R450 Nervousness: Secondary | ICD-10-CM | POA: Diagnosis not present

## 2017-06-08 DIAGNOSIS — F3164 Bipolar disorder, current episode mixed, severe, with psychotic features: Principal | ICD-10-CM

## 2017-06-08 DIAGNOSIS — F909 Attention-deficit hyperactivity disorder, unspecified type: Secondary | ICD-10-CM | POA: Diagnosis present

## 2017-06-08 DIAGNOSIS — Z818 Family history of other mental and behavioral disorders: Secondary | ICD-10-CM | POA: Diagnosis not present

## 2017-06-08 DIAGNOSIS — F1910 Other psychoactive substance abuse, uncomplicated: Secondary | ICD-10-CM | POA: Diagnosis not present

## 2017-06-08 DIAGNOSIS — R45851 Suicidal ideations: Secondary | ICD-10-CM | POA: Diagnosis not present

## 2017-06-08 DIAGNOSIS — Z79899 Other long term (current) drug therapy: Secondary | ICD-10-CM | POA: Diagnosis not present

## 2017-06-08 DIAGNOSIS — F17290 Nicotine dependence, other tobacco product, uncomplicated: Secondary | ICD-10-CM | POA: Diagnosis present

## 2017-06-08 DIAGNOSIS — Z62810 Personal history of physical and sexual abuse in childhood: Secondary | ICD-10-CM | POA: Diagnosis not present

## 2017-06-08 DIAGNOSIS — F429 Obsessive-compulsive disorder, unspecified: Secondary | ICD-10-CM | POA: Diagnosis present

## 2017-06-08 DIAGNOSIS — F410 Panic disorder [episodic paroxysmal anxiety] without agoraphobia: Secondary | ICD-10-CM | POA: Diagnosis present

## 2017-06-08 DIAGNOSIS — F1220 Cannabis dependence, uncomplicated: Secondary | ICD-10-CM | POA: Diagnosis present

## 2017-06-08 DIAGNOSIS — Z915 Personal history of self-harm: Secondary | ICD-10-CM | POA: Diagnosis not present

## 2017-06-08 DIAGNOSIS — F603 Borderline personality disorder: Secondary | ICD-10-CM | POA: Diagnosis present

## 2017-06-08 DIAGNOSIS — F319 Bipolar disorder, unspecified: Secondary | ICD-10-CM | POA: Diagnosis present

## 2017-06-08 DIAGNOSIS — Z560 Unemployment, unspecified: Secondary | ICD-10-CM

## 2017-06-08 DIAGNOSIS — F312 Bipolar disorder, current episode manic severe with psychotic features: Secondary | ICD-10-CM | POA: Diagnosis present

## 2017-06-08 DIAGNOSIS — G4700 Insomnia, unspecified: Secondary | ICD-10-CM | POA: Diagnosis present

## 2017-06-08 DIAGNOSIS — Z87891 Personal history of nicotine dependence: Secondary | ICD-10-CM | POA: Diagnosis not present

## 2017-06-08 MED ORDER — ALPRAZOLAM 1 MG PO TABS
2.0000 mg | ORAL_TABLET | Freq: Three times a day (TID) | ORAL | Status: DC | PRN
  Administered 2017-06-08 – 2017-06-13 (×11): 2 mg via ORAL
  Filled 2017-06-08 (×11): qty 4

## 2017-06-08 MED ORDER — ACETAMINOPHEN 325 MG PO TABS
650.0000 mg | ORAL_TABLET | Freq: Four times a day (QID) | ORAL | Status: DC | PRN
  Administered 2017-06-13 – 2017-06-14 (×2): 650 mg via ORAL
  Filled 2017-06-08 (×2): qty 2

## 2017-06-08 MED ORDER — OLANZAPINE 5 MG PO TBDP
5.0000 mg | ORAL_TABLET | Freq: Three times a day (TID) | ORAL | Status: DC | PRN
  Administered 2017-06-12 – 2017-06-13 (×2): 5 mg via ORAL
  Filled 2017-06-08 (×3): qty 1

## 2017-06-08 MED ORDER — OXCARBAZEPINE 150 MG PO TABS
150.0000 mg | ORAL_TABLET | Freq: Two times a day (BID) | ORAL | Status: DC
  Administered 2017-06-08 – 2017-06-13 (×10): 150 mg via ORAL
  Filled 2017-06-08 (×12): qty 1

## 2017-06-08 MED ORDER — ALUM & MAG HYDROXIDE-SIMETH 200-200-20 MG/5ML PO SUSP: 30.0000 mL | ORAL | Status: DC | PRN

## 2017-06-08 MED ORDER — NICOTINE 21 MG/24HR TD PT24
21.0000 mg | MEDICATED_PATCH | Freq: Every day | TRANSDERMAL | Status: DC
  Administered 2017-06-08 – 2017-06-14 (×7): 21 mg via TRANSDERMAL
  Filled 2017-06-08 (×11): qty 1

## 2017-06-08 MED ORDER — DOXEPIN HCL 25 MG PO CAPS
25.0000 mg | ORAL_CAPSULE | Freq: Every day | ORAL | Status: DC
  Administered 2017-06-08 – 2017-06-13 (×6): 25 mg via ORAL
  Filled 2017-06-08 (×11): qty 1

## 2017-06-08 MED ORDER — TRAZODONE HCL 150 MG PO TABS: 150.0000 mg | ORAL_TABLET | Freq: Every evening | ORAL | Status: DC | PRN

## 2017-06-08 MED ORDER — ONDANSETRON 4 MG PO TBDP
ORAL_TABLET | ORAL | Status: AC
  Filled 2017-06-08: qty 1

## 2017-06-08 MED ORDER — ONDANSETRON 4 MG PO TBDP
4.0000 mg | ORAL_TABLET | Freq: Three times a day (TID) | ORAL | Status: DC | PRN
  Administered 2017-06-08 – 2017-06-13 (×7): 4 mg via ORAL
  Filled 2017-06-08 (×7): qty 1

## 2017-06-08 MED ORDER — HYDROXYZINE HCL 25 MG PO TABS: 25.0000 mg | ORAL_TABLET | Freq: Three times a day (TID) | ORAL | Status: DC | PRN

## 2017-06-08 MED ORDER — MAGNESIUM HYDROXIDE 400 MG/5ML PO SUSP
30.0000 mL | Freq: Every day | ORAL | Status: DC | PRN
  Administered 2017-06-11 – 2017-06-12 (×2): 30 mL via ORAL
  Filled 2017-06-08 (×2): qty 30

## 2017-06-08 MED ORDER — PERPHENAZINE 4 MG PO TABS
4.0000 mg | ORAL_TABLET | ORAL | Status: DC
  Administered 2017-06-08 – 2017-06-09 (×2): 4 mg via ORAL
  Filled 2017-06-08 (×6): qty 1

## 2017-06-08 MED ORDER — HYDROXYZINE HCL 50 MG PO TABS
50.0000 mg | ORAL_TABLET | Freq: Four times a day (QID) | ORAL | Status: DC | PRN
  Administered 2017-06-09 – 2017-06-13 (×4): 50 mg via ORAL
  Filled 2017-06-08 (×5): qty 1
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
## 2017-06-08 NOTE — [facility] Suicide Risk Assessment (Signed)
[facility] INPATIENT:  Family/Significant Other Suicide Prevention Education  Suicide Prevention Education:  Education Completed; , Husband, Brittany MallingJoseph [name] [phone] has been identified by the patient as the family member/significant other with whom the patient will be residing, and identified as the person(s) who will aid the patient in the event of a mental health crisis (suicidal ideations/suicide attempt).  With written consent from the patient, the family member/significant other has been provided the following suicide prevention education, prior to the and/or following the discharge of the patient.  The suicide prevention education provided includes the following:  Suicide risk factors  Suicide prevention and interventions  National Suicide Hotline telephone number  [facility] assessment telephone number  [facility] Assistance 911  [facility] and/or Residential Mobile Crisis Unit telephone number  Request made of family/significant other to:  Remove weapons (e.g., guns, rifles, knives), all items previously/currently identified as safety concern.    Remove drugs/medications (over-the-counter, prescriptions, illicit drugs), all items previously/currently identified as a safety concern.  The family member/significant other verbalizes understanding of the suicide prevention education information provided.  The family member/significant other agrees to remove the items of safety concern listed above.  Husband confirms that there are no guns in the home. There was a medication change that caused her behavior to be erratic.  She has had ten hospitalizations in the last three years.   Brittany [name] 06/08/2017, 11:01 AM
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
## 2017-06-08 NOTE — Progress Notes (Signed)
Recreation Therapy Notes  Date: 2/29/18 Time: 1000 Location: 500 Hall Dayroom  Group Topic: Wellness  Goal Area(s) Addresses:  Patient will define components of whole wellness. Patient will verbalize benefit of whole wellness.  Behavioral Response: None  Intervention:  2 Decks of cards  Activity: Deck of Chance.  LRT had two decks of cards.  From one deck, LRT gave each patient two cards.  From the other deck, LRT would pull a card and whatever the card was the patient with the matching number had to do the exercise that corresponded with that number.  Education: Wellness, Building control surveyorDischarge Planning.   Education Outcome: Acknowledges education/In group clarification offered/Needs additional education.   Clinical Observations/Feedback: Pt did not participate in group.  Pt observed her peers as they did the exercises.  Pt did socialize and engage with her peers.   Caroll RancherMarjette Humaira Sculley, LRT/CTRS         Caroll RancherLindsay, Lot Medford A 06/08/2017 12:53 PM

## 2017-06-08 NOTE — Tx Team (Signed)
Interdisciplinary Treatment and Diagnostic Plan Update  06/08/2017 Time of Session: 3:51 PM  Brittany [name] MRN: [id number]  Principal Diagnosis: Bipolar disorder, curr episode mixed, severe, with psychotic features ([place])  Secondary Diagnoses: Principal Problem:   Bipolar disorder, curr episode mixed, severe, with psychotic features ([place]) Active Problems:   Borderline personality disorder   ADHD (attention deficit hyperactivity disorder)   Current Medications:  Current Facility-Administered Medications  Medication Dose Route Frequency Provider Last Rate Last Dose  . acetaminophen (TYLENOL) tablet 650 mg  650 mg Oral Q6H PRN Okonkwo, Justina A, NP      . ALPRAZolam Duanne Moron) tablet 2 mg  2 mg Oral TID PRN Ursula Alert, MD   2 mg at 06/08/17 1332  . alum & mag hydroxide-simeth (MAALOX/MYLANTA) 200-200-20 MG/5ML suspension 30 mL  30 mL Oral Q4H PRN Okonkwo, Justina A, NP      . doxepin (SINEQUAN) capsule 25 mg  25 mg Oral QHS Eappen, Saramma, MD      . hydrOXYzine (ATARAX/VISTARIL) tablet 50 mg  50 mg Oral Q6H PRN [name], PA-C      . magnesium hydroxide (MILK OF MAGNESIA) suspension 30 mL  30 mL Oral Daily PRN Okonkwo, Justina A, NP      . nicotine (NICODERM CQ - dosed in mg/24 hours) patch 21 mg  21 mg Transdermal Daily Nwoko, Agnes I, NP   21 mg at 06/08/17 1440  . OLANZapine zydis (ZYPREXA) disintegrating tablet 5 mg  5 mg Oral TID PRN Eappen, Saramma, MD      . ondansetron (ZOFRAN-ODT) 4 MG disintegrating tablet           . ondansetron (ZOFRAN-ODT) disintegrating tablet 4 mg  4 mg Oral TID PRN Ursula Alert, MD   4 mg at 06/08/17 1213  . OXcarbazepine (TRILEPTAL) tablet 150 mg  150 mg Oral BID Eappen, Saramma, MD      . perphenazine (TRILAFON) tablet 4 mg  4 mg Oral BH-qamhs Eappen, Saramma, MD        PTA Medications: Prescriptions Prior to Admission  Medication Sig Dispense Refill Last Dose  . alprazolam (XANAX) 2 MG tablet Take 2 mg by mouth 3 (three) times daily.    06/07/2017  . amphetamine-dextroamphetamine (ADDERALL) 15 MG tablet Take 15 mg by mouth daily with lunch.    06/07/2017  . amphetamine-dextroamphetamine (ADDERALL) 30 MG tablet Take 30 mg by mouth every morning.    06/07/2017  . BIOTIN PO Take 1 tablet by mouth daily.   06/07/2017  . busPIRone (BUSPAR) 15 MG tablet Take 15 mg by mouth 2 (two) times daily.   06/06/2017  . furosemide (LASIX) 20 MG tablet Take 20 mg by mouth daily.   06/07/2017  . hydrOXYzine (ATARAX/VISTARIL) 25 MG tablet Take 25 mg by mouth every 8 (eight) hours as needed for anxiety.   06/07/2017  . loperamide (IMODIUM) 2 MG capsule Take 2 capsules (4 mg total) by mouth daily with breakfast. 30 capsule 1 06/06/2017  . omeprazole (PRILOSEC) 20 MG capsule Take 20 mg by mouth daily as needed (For heartburn or acid reflux.).   06/06/2017  . ondansetron (ZOFRAN-ODT) 8 MG disintegrating tablet Take 8 mg by mouth 2 (two) times daily as needed for nausea or vomiting.   06/08/2017  . tiZANidine (ZANAFLEX) 4 MG tablet Take 4-8 mg by mouth 3 (three) times daily as needed for muscle spasms.    06/06/2017  . topiramate (TOPAMAX) 100 MG tablet Take 100 mg by mouth at bedtime as needed (  For headaches).   Jan 2018  . traZODone (DESYREL) 150 MG tablet Take 450-750 mg by mouth at bedtime.   06/06/2017    Patient Stressors: Financial difficulties Loss of best friend March 2018 Marital or family conflict Medication change or noncompliance  Patient Strengths: Ability for insight Active sense of humor Average or above average intelligence Capable of independent living Occupational psychologist fund of knowledge Motivation for treatment/growth Physical Health Supportive family/friends Work skills  Treatment Modalities: Medication Management, Group therapy, Case management,  1 to 1 session with clinician, Psychoeducation, Recreational therapy.   Physician Treatment Plan for Primary Diagnosis: Bipolar disorder, curr episode mixed,  severe, with psychotic features ([place]) Long Term Goal(s): Improvement in symptoms so as ready for discharge  Short Term Goals: Ability to identify changes in lifestyle to reduce recurrence of condition will improve Ability to verbalize feelings will improve Ability to disclose and discuss suicidal ideas Ability to identify and develop effective coping behaviors will improve Compliance with prescribed medications will improve  Medication Management: Evaluate patient's response, side effects, and tolerance of medication regimen.  Therapeutic Interventions: 1 to 1 sessions, Unit Group sessions and Medication administration.  Evaluation of Outcomes: Progressing  Physician Treatment Plan for Secondary Diagnosis: Principal Problem:   Bipolar disorder, curr episode mixed, severe, with psychotic features ([place]) Active Problems:   Borderline personality disorder   ADHD (attention deficit hyperactivity disorder)   Long Term Goal(s): Improvement in symptoms so as ready for discharge  Short Term Goals: Ability to identify changes in lifestyle to reduce recurrence of condition will improve Ability to verbalize feelings will improve Ability to disclose and discuss suicidal ideas Ability to identify and develop effective coping behaviors will improve Compliance with prescribed medications will improve  Medication Management: Evaluate patient's response, side effects, and tolerance of medication regimen.  Therapeutic Interventions: 1 to 1 sessions, Unit Group sessions and Medication administration.  Evaluation of Outcomes: Progressing   RN Treatment Plan for Primary Diagnosis: Bipolar disorder, curr episode mixed, severe, with psychotic features ([place]) Long Term Goal(s): Knowledge of disease and therapeutic regimen to maintain health will improve  Short Term Goals: Ability to disclose and discuss suicidal ideas and Ability to identify and develop effective coping behaviors will  improve  Medication Management: RN will administer medications as ordered by provider, will assess and evaluate patient's response and provide education to patient for prescribed medication. RN will report any adverse and/or side effects to prescribing provider.  Therapeutic Interventions: 1 on 1 counseling sessions, Psychoeducation, Medication administration, Evaluate responses to treatment, Monitor vital signs and CBGs as ordered, Perform/monitor CIWA, COWS, AIMS and Fall Risk screenings as ordered, Perform wound care treatments as ordered.  Evaluation of Outcomes: Progressing   [name] Treatment Plan for Primary Diagnosis: Bipolar disorder, curr episode mixed, severe, with psychotic features ([place]) Long Term Goal(s): Safe transition to appropriate next level of care at discharge, Engage patient in therapeutic group addressing interpersonal concerns.  Short Term Goals: Engage patient in aftercare planning with referrals and resources  Therapeutic Interventions: Assess for all discharge needs, 1 to 1 time with Social worker, Explore available resources and support systems, Assess for adequacy in community support network, Educate family and significant other(s) on suicide prevention, Complete Psychosocial Assessment, Interpersonal group therapy.  Evaluation of Outcomes: Met  Return home, follow up outpt   Progress in Treatment: Attending groups: Yes Participating in groups: Yes Taking medication as prescribed: Yes Toleration medication: Yes, no side effects reported at this time Family/Significant other contact  made:  Patient understands diagnosis: Yes AEB Discussing patient identified problems/goals with staff: Yes Medical problems stabilized or resolved: Yes Denies suicidal/homicidal ideation: Yes Issues/concerns per patient self-inventory: None Other: N/A  New problem(s) identified: None identified at this time.   New Short Term/Long Term Goal(s): "I need help with anxiety which  leads to panic, depression and sleep, but I don't think you can help me because none of the previous 16 hospitalizations helped. But you can help me feel safe here."  Discharge Plan or Barriers:   Reason for Continuation of Hospitalization: Anxiety  Depression  Medication stabilization Suicidal ideation   Estimated Length of Stay: 3-5 days  Attendees: Patient: Brittany [name] 06/08/2017  3:51 PM  Physician: Ursula Alert, MD 06/08/2017  3:51 PM  Nursing: Sena Hitch, RN 06/08/2017  3:51 PM  RN Care Manager: Lars Pinks, RN 06/08/2017  3:51 PM  Social Worker: Ripley Fraise 06/08/2017  3:51 PM  Recreational Therapist: Winfield Cunas 06/08/2017  3:51 PM  Other: Norberto Sorenson 06/08/2017  3:51 PM  Other:  06/08/2017  3:51 PM    Scribe for Treatment Team:  Roque Lias [name] 06/08/2017 3:51 PM

## 2017-06-08 NOTE — Progress Notes (Signed)
Patient attended group and said that her day started as a 10 but ended as 2.  Patient continued, she felt like throwing hot coffee on one of her peers.  Writer asked the patient to kindly speak  to one of the staff members or the nurse before taking such action..  Patient agreed.  The nurse was also notified.
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
## 2017-06-08 NOTE — [facility] Group Notes (Signed)
[name] Group Therapy Note   06/08/2017 1:15pm   Type of Therapy and Topic:  Group Therapy:  Overcoming Obstacles   Participation Level:  Active   Description of Group:    In this group patients will be encouraged to explore what they see as obstacles to their own wellness and recovery. They will be guided to discuss their thoughts, feelings, and behaviors related to these obstacles. The group will process together ways to cope with barriers, with attention given to specific choices patients can make. Each patient will be challenged to identify changes they are motivated to make in order to overcome their obstacles. This group will be process-oriented, with patients participating in exploration of their own experiences as well as giving and receiving support and challenge from other group members.   Therapeutic Goals: 1. Patient will identify personal and current obstacles as they relate to admission. 2. Patient will identify barriers that currently interfere with their wellness or overcoming obstacles.  3. Patient will identify feelings, thought process and behaviors related to these barriers. 4. Patient will identify two changes they are willing to make to overcome these obstacles:      Summary of Patient Progress   Stayed the entire time, engaged throughout.  My biggest obstacle is finances, but I'll admit it is because I am impulsive with spending." Went on to say that she is goal oriented with getting her degree-"I can't wait to walk across that stage and get my diplomma."  Stated she could apply the same thing to finances.  "We want to buy a new house, but our credit score is bad.  We were not using our cards for awhile.  I can get back to that again if I remember about the house."  Bubba CampSang the praises of [employer] which she attended when 19. "They gave me structure and determination."  Good feedback to several other patients.   Therapeutic Modalities:   Cognitive Behavioral Therapy Solution  Focused Therapy Motivational Interviewing Relapse Prevention Therapy  Ida RogueRodney B [name] Vanderveer, [name] 06/08/2017 3:48 PM

## 2017-06-08 NOTE — Tx Team (Signed)
Initial Treatment Plan 06/08/2017 2:57 AM Brittany Buffyhristine [name] ZOX:[id number]    PATIENT STRESSORS: Financial difficulties Loss of best friend March 2018 Marital or family conflict Medication change or noncompliance   PATIENT STRENGTHS: Ability for insight Active sense of humor Average or above average intelligence Capable of independent living MetallurgistCommunication skills Financial means General fund of knowledge Motivation for treatment/growth Physical Health Supportive family/friends Work skills   PATIENT IDENTIFIED PROBLEMS: " I want them to get me on the right anti-depressant"  "get some sleep"    Suicidal Ideation  Altered mood  Increased risk for suicide           DISCHARGE CRITERIA:  Ability to meet basic life and health needs Adequate post-discharge living arrangements Improved stabilization in mood, thinking, and/or behavior Medical problems require only outpatient monitoring Motivation to continue treatment in a less acute level of care Need for constant or close observation no longer present Reduction of life-threatening or endangering symptoms to within safe limits Safe-care adequate arrangements made Verbal commitment to aftercare and medication compliance  PRELIMINARY DISCHARGE PLAN: Outpatient therapy Return to previous living arrangement  PATIENT/FAMILY INVOLVEMENT: This treatment plan has been presented to and reviewed with the patient, Brittany [name], and/or family member.  The patient and family have been given the opportunity to ask questions and make suggestions.  Doristine JohnsBrooks, Kanyah Matsushima Laverne, RN 06/08/2017, 2:57 AM
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
## 2017-06-09 DIAGNOSIS — F1220 Cannabis dependence, uncomplicated: Secondary | ICD-10-CM

## 2017-06-09 DIAGNOSIS — Z87891 Personal history of nicotine dependence: Secondary | ICD-10-CM

## 2017-06-09 DIAGNOSIS — G4700 Insomnia, unspecified: Secondary | ICD-10-CM

## 2017-06-09 MED ORDER — PERPHENAZINE 8 MG PO TABS
8.0000 mg | ORAL_TABLET | Freq: Every day | ORAL | Status: DC
  Administered 2017-06-10 – 2017-06-13 (×4): 8 mg via ORAL
  Filled 2017-06-09: qty 2
  Filled 2017-06-09 (×6): qty 1

## 2017-06-09 MED ORDER — FUROSEMIDE 20 MG PO TABS
20.0000 mg | ORAL_TABLET | Freq: Every day | ORAL | Status: DC
  Administered 2017-06-09 – 2017-06-14 (×6): 20 mg via ORAL
  Filled 2017-06-09 (×11): qty 1
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
## 2017-06-09 NOTE — Progress Notes (Signed)
Patient attended group and said that her day was a 3.  Patient said she slept  most of the day because of the medications. Her goal for tomorrow is to speak to the physician to adjust her medications.

## 2017-06-09 NOTE — [facility] Group Notes (Signed)
[name] Group Therapy Note   06/09/2017 1:15pm   Type of Therapy and Topic:  Group Therapy:  Positive Affirmations   Participation Level:  Did Not Attend  Description of Group: This group addressed positive affirmation toward self and others. Patients went around the room and identified two positive things about themselves and two positive things about a peer in the room. Patients reflected on how it felt to share something positive with others, to identify positive things about themselves, and to hear positive things from others. Patients were encouraged to have a daily reflection of positive characteristics or circumstances.  Therapeutic Goals 1. Patient will verbalize two of their positive qualities 2. Patient will demonstrate empathy for others by stating two positive qualities about a peer in the group 3. Patient will verbalize their feelings when voicing positive self affirmations and when voicing positive affirmations of others 4. Patients will discuss the potential positive impact on their wellness/recovery of focusing on positive traits of self and others. Summary of Patient Progress:    Therapeutic Modalities Cognitive Behavioral Therapy Motivational Interviewing  Carlynn Heraldngel M Makinsley Schiavi, Student-Social Work 06/09/2017 2:57 PM

## 2017-06-09 NOTE — Progress Notes (Signed)
DAR NOTE: Patient presents with anxious affect and mood.  Reports new medication making her too sleepy and drowsy.  Denies auditory and visual hallucinations.  Reports suicidal thoughts but contracts for safety.  Described energy level as low and concentration as poor.  Rates depression at 8, hopelessness at 8, and anxiety at 8.  Maintained on routine safety checks.  Medications given as prescribed.  Support and encouragement offered as needed.  States goal for today is "sleep."  Patient remained in her room most of this shift.  Complain of swollen ankles.  Provider resumed her lasix.

## 2017-06-09 NOTE — Tx Team (Signed)
Interdisciplinary Treatment and Diagnostic Plan Update  06/09/2017 Time of Session: 10:09 AM  Brittany [name] MRN: [id number]  Principal Diagnosis: Bipolar disorder, curr episode mixed, severe, with psychotic features ([place])  Secondary Diagnoses: Principal Problem:   Bipolar disorder, curr episode mixed, severe, with psychotic features ([place]) Active Problems:   Borderline personality disorder   ADHD (attention deficit hyperactivity disorder)   Current Medications:  Current Facility-Administered Medications  Medication Dose Route Frequency Provider Last Rate Last Dose  . acetaminophen (TYLENOL) tablet 650 mg  650 mg Oral Q6H PRN Okonkwo, Justina A, NP      . ALPRAZolam Duanne Moron) tablet 2 mg  2 mg Oral TID PRN Ursula Alert, MD   2 mg at 06/09/17 0010  . alum & mag hydroxide-simeth (MAALOX/MYLANTA) 200-200-20 MG/5ML suspension 30 mL  30 mL Oral Q4H PRN Okonkwo, Justina A, NP      . doxepin (SINEQUAN) capsule 25 mg  25 mg Oral QHS Eappen, Saramma, MD   25 mg at 06/08/17 2124  . hydrOXYzine (ATARAX/VISTARIL) tablet 50 mg  50 mg Oral Q6H PRN Laverle Hobby, PA-C   50 mg at 06/09/17 0014  . magnesium hydroxide (MILK OF MAGNESIA) suspension 30 mL  30 mL Oral Daily PRN Okonkwo, Justina A, NP      . nicotine (NICODERM CQ - dosed in mg/24 hours) patch 21 mg  21 mg Transdermal Daily Lindell Spar I, NP   21 mg at 06/09/17 0808  . OLANZapine zydis (ZYPREXA) disintegrating tablet 5 mg  5 mg Oral TID PRN Ursula Alert, MD      . ondansetron (ZOFRAN-ODT) disintegrating tablet 4 mg  4 mg Oral TID PRN Ursula Alert, MD   4 mg at 06/08/17 1812  . OXcarbazepine (TRILEPTAL) tablet 150 mg  150 mg Oral BID Ursula Alert, MD   150 mg at 06/09/17 0807  . perphenazine (TRILAFON) tablet 4 mg  4 mg Oral Brynda Greathouse, MD   4 mg at 06/09/17 0807    PTA Medications: Prescriptions Prior to Admission  Medication Sig Dispense Refill Last Dose  . alprazolam (XANAX) 2 MG tablet Take 2 mg by mouth 3  (three) times daily.   06/07/2017  . amphetamine-dextroamphetamine (ADDERALL) 15 MG tablet Take 15 mg by mouth daily with lunch.    06/07/2017  . amphetamine-dextroamphetamine (ADDERALL) 30 MG tablet Take 30 mg by mouth every morning.    06/07/2017  . BIOTIN PO Take 1 tablet by mouth daily.   06/07/2017  . busPIRone (BUSPAR) 15 MG tablet Take 15 mg by mouth 2 (two) times daily.   06/06/2017  . furosemide (LASIX) 20 MG tablet Take 20 mg by mouth daily.   06/07/2017  . hydrOXYzine (ATARAX/VISTARIL) 25 MG tablet Take 25 mg by mouth every 8 (eight) hours as needed for anxiety.   06/07/2017  . loperamide (IMODIUM) 2 MG capsule Take 2 capsules (4 mg total) by mouth daily with breakfast. 30 capsule 1 06/06/2017  . omeprazole (PRILOSEC) 20 MG capsule Take 20 mg by mouth daily as needed (For heartburn or acid reflux.).   06/06/2017  . ondansetron (ZOFRAN-ODT) 8 MG disintegrating tablet Take 8 mg by mouth 2 (two) times daily as needed for nausea or vomiting.   06/08/2017  . tiZANidine (ZANAFLEX) 4 MG tablet Take 4-8 mg by mouth 3 (three) times daily as needed for muscle spasms.    06/06/2017  . topiramate (TOPAMAX) 100 MG tablet Take 100 mg by mouth at bedtime as needed (For headaches).   Jan  2018  . traZODone (DESYREL) 150 MG tablet Take 450-750 mg by mouth at bedtime.   06/06/2017    Patient Stressors: Financial difficulties Loss of best friend March 2018 Marital or family conflict Medication change or noncompliance  Patient Strengths: Ability for insight Active sense of humor Average or above average intelligence Capable of independent living Occupational psychologist fund of knowledge Motivation for treatment/growth Physical Health Supportive family/friends Work skills  Treatment Modalities: Medication Management, Group therapy, Case management,  1 to 1 session with clinician, Psychoeducation, Recreational therapy.   Physician Treatment Plan for Primary Diagnosis: Bipolar  disorder, curr episode mixed, severe, with psychotic features ([place]) Long Term Goal(s): Improvement in symptoms so as ready for discharge  Short Term Goals: Ability to identify changes in lifestyle to reduce recurrence of condition will improve Ability to verbalize feelings will improve Ability to disclose and discuss suicidal ideas Ability to identify and develop effective coping behaviors will improve Compliance with prescribed medications will improve  Medication Management: Evaluate patient's response, side effects, and tolerance of medication regimen.  Therapeutic Interventions: 1 to 1 sessions, Unit Group sessions and Medication administration.  Evaluation of Outcomes: Progressing  Physician Treatment Plan for Secondary Diagnosis: Principal Problem:   Bipolar disorder, curr episode mixed, severe, with psychotic features ([place]) Active Problems:   Borderline personality disorder   ADHD (attention deficit hyperactivity disorder)   Long Term Goal(s): Improvement in symptoms so as ready for discharge  Short Term Goals: Ability to identify changes in lifestyle to reduce recurrence of condition will improve Ability to verbalize feelings will improve Ability to disclose and discuss suicidal ideas Ability to identify and develop effective coping behaviors will improve Compliance with prescribed medications will improve  Medication Management: Evaluate patient's response, side effects, and tolerance of medication regimen.  Therapeutic Interventions: 1 to 1 sessions, Unit Group sessions and Medication administration.  Evaluation of Outcomes: Progressing   RN Treatment Plan for Primary Diagnosis: Bipolar disorder, curr episode mixed, severe, with psychotic features ([place]) Long Term Goal(s): Knowledge of disease and therapeutic regimen to maintain health will improve  Short Term Goals: Ability to identify and develop effective coping behaviors will improve and Compliance with prescribed  medications will improve  Medication Management: RN will administer medications as ordered by provider, will assess and evaluate patient's response and provide education to patient for prescribed medication. RN will report any adverse and/or side effects to prescribing provider.  Therapeutic Interventions: 1 on 1 counseling sessions, Psychoeducation, Medication administration, Evaluate responses to treatment, Monitor vital signs and CBGs as ordered, Perform/monitor CIWA, COWS, AIMS and Fall Risk screenings as ordered, Perform wound care treatments as ordered.  Evaluation of Outcomes: Adequate for Discharge    Recreational Therapy Treatment Plan for Primary Diagnosis: Bipolar disorder, curr episode mixed, severe, with psychotic features ([place]) Long Term Goal(s): Patient will participate in recreation therapy treatment in at least 2 group sessions without prompting from LRT  Short Term Goals: Patient will be able to identify at least 5 coping skills for admitting diagnosis by conclusion of recreation therapy treatment  Treatment Modalities: Group and Pet Therapy  Therapeutic Interventions: Psychoeducation  Evaluation of Outcomes: Progressing   [name] Treatment Plan for Primary Diagnosis: Bipolar disorder, curr episode mixed, severe, with psychotic features ([place]) Long Term Goal(s): Safe transition to appropriate next level of care at discharge, Engage patient in therapeutic group addressing interpersonal concerns.  Short Term Goals: Engage patient in aftercare planning with referrals and resources  Therapeutic Interventions: Assess for all discharge  needs, 1 to 1 time with Education officer, museum, Explore available resources and support systems, Assess for adequacy in community support network, Educate family and significant other(s) on suicide prevention, Complete Psychosocial Assessment, Interpersonal group therapy.  Evaluation of Outcomes: Met Return home, follow up outpt   Progress in  Treatment: Attending groups: Yes Participating in groups: Yes Taking medication as prescribed: Yes Toleration medication: Yes, no side effects reported at this time Family/Significant other contact made: Yes Patient understands diagnosis: Yes AEB asking for help in keeping her safe Discussing patient identified problems/goals with staff: Yes Medical problems stabilized or resolved: Yes Denies suicidal/homicidal ideation: Yes Issues/concerns per patient self-inventory: None Other: N/A  New problem(s) identified: None identified at this time.   New Short Term/Long Term Goal(s): "I need help with anxiety which leads to panic, depression and sleep, but I don't think you can help me because none of the previous 16 hospitalizations helped. But you can help me feel safe here."  Discharge Plan or Barriers:   Reason for Continuation of Hospitalization: Anxiety Depression Medication stabilization Suicidal ideation   Estimated Length of Stay: 9/4  Attendees: Patient: Brittany [name] 06/09/2017  10:09 AM  Physician: Neita Garnet, MD 06/09/2017  10:09 AM  Nursing: Sena Hitch, RN 06/09/2017  10:09 AM  RN Care Manager: Lars Pinks, RN 06/09/2017  10:09 AM  Social Worker: Ripley Fraise 06/09/2017  10:09 AM  Recreational Therapist: Victorino Sparrow, LRT/CTRS 06/09/2017  10:09 AM  Other: Norberto Sorenson 06/09/2017  10:09 AM  Other:  06/09/2017  10:09 AM    Scribe for Treatment Team:  Roque Lias [name] 06/09/2017 10:09 AM

## 2017-06-09 NOTE — Progress Notes (Signed)
D: Pt was hyper verbal. When asked about her day pt stated, the dr said, "I'm gonna keep you on your xanax".  Then pt explained she had an eating disorder in HS. Almost immediately pt explained that she's been constipated for 6 days, but began having bowel movements today. Pt also informed the writer that she's had only 3.5 hrs of sleep since Thursday.  Pt has no questions or concerns.    A:  Support and encouragement was offered. 15 min checks continued for safety.  R: Pt remains safe.

## 2017-06-09 NOTE — Progress Notes (Signed)
[facility] MD Progress Note  06/09/2017 3:45 PM Brittany [name]  MRN:  [id number]  Subjective: Brittany [name] reports, "I'm not sleeping at night. But, I sleep all day. I can barely keep my eyes open during group sessions. I'm sleeping all day today. My legs & feet are swollen. It happens to me a lot. I was on lasix by my primary care doctor. I have not taken it since I have been here. I'm not hearing voices or seeing things, No thoughts of SIHI today. I just want to be fixed because I don't ever want to feel like this again".  Objective: Pt with Bipolar do, BPD, ADHD, Panic attacks, presented after she had a bad reaction to trintillex Per husband. Brittany [name] is seen in her room today for this assessment. She is alert, barely awake. She is complaining of excessive drowsiness & inability to keep her eyes open during groups. She is blaming it on the perphenazine day time dose. She is reminded also that she is taking Xanax 2 mg three times a day on the clock. She says that the drowsiness is not coming from the Xanax, rather from her new antipsychotic medication. The perphenazine 4 mg bid dose is combined to 8 mg dose Q hs starting tomorrow night. She denies any SIHI, AVH, delusional thoughts or paranoia. She is complaining of bilateral lower extremity swelling. Resumed her lasix 20 mg daily dose.  Principal Problem: Bipolar disorder, curr episode mixed, severe, with psychotic features (HCC)  Diagnosis:   Patient Active Problem List   Diagnosis Date Noted  . Bipolar disorder, curr episode mixed, severe, with psychotic features (HCC) [F31.64] 06/08/2017  . ADHD (attention deficit hyperactivity disorder) [F90.9] 09/15/2016  . Borderline personality disorder [F60.3] 05/04/2016  . Overdose [T50.901A] 05/03/2016  . Noncompliance [Z91.19] 05/03/2016   Total Time spent with patient: 25 minutes  Past Psychiatric History: Bipolar affective disorder.  Past Medical History:  Past Medical History:  Diagnosis Date  .  ADHD (attention deficit hyperactivity disorder)   . Anxiety   . Arthritis    right shoulder, bilateral knees  . Bipolar disorder (HCC)   . Bipolar I disorder, most recent episode depressed (HCC)   . Chronic kidney disease    Kidney stones  . Depression   . Headache     Past Surgical History:  Procedure Laterality Date  . CESAREAN SECTION    . CHOLECYSTECTOMY  2017  . FOOT SURGERY    . KIDNEY SURGERY    . NOVASURE ABLATION  2010  . SHOULDER SURGERY Right   . TUBAL LIGATION     Family History:  Family History  Problem Relation Age of Onset  . Anxiety disorder Mother   . Depression Mother   . Drug abuse Mother   . Bipolar disorder Mother   . Depression Father   . Anxiety disorder Father   . Drug abuse Father   . Anxiety disorder Sister   . Depression Sister   . ADD / ADHD Sister   . Anxiety disorder Sister   . Depression Sister    Family Psychiatric  History: See H&P.  Social History:  History  Alcohol Use  . 2.4 - 7.2 oz/week  . 4 Glasses of wine per week    Comment: "couple weeks"     History  Drug Use  . Types: Marijuana    Comment: last used about 3 weeks ago    Social History   Social History  . Marital status: Married    Spouse name:  N/A  . Number of children: N/A  . Years of education: N/A   Social History Main Topics  . Smoking status: Former Smoker    Types: E-cigarettes  . Smokeless tobacco: Never Used  . Alcohol use 2.4 - 7.2 oz/week    4 Glasses of wine per week     Comment: "couple weeks"  . Drug use: Yes    Types: Marijuana     Comment: last used about 3 weeks ago  . Sexual activity: Yes    Birth control/ protection: None   Other Topics Concern  . None   Social History Narrative  . None   Additional Social History:    Pain Medications: see PTA meds Prescriptions: see PTA meds Over the Counter: see PTA meds History of alcohol / drug use?: Yes Longest period of sobriety (when/how long): 3 weeks Name of Substance 1: thc 1 -  Age of First Use: 18 1 - Amount (size/oz): vaires 1 - Frequency: once monthly 1 - Duration: on going  1 - Last Use / Amount: couple weeks ago Name of Substance 2: etoh 2 - Age of First Use: 19 yrs 2 - Frequency: twice monthly 2 - Duration: on going 2 - Last Use / Amount: 2 weeks ago  Sleep: "I did not sleep".  Appetite:  Good  Current Medications: Current Facility-Administered Medications  Medication Dose Route Frequency Provider Last Rate Last Dose  . acetaminophen (TYLENOL) tablet 650 mg  650 mg Oral Q6H PRN Okonkwo, Justina A, NP      . ALPRAZolam Prudy Feeler) tablet 2 mg  2 mg Oral TID PRN Jomarie Longs, MD   2 mg at 06/09/17 1147  . alum & mag hydroxide-simeth (MAALOX/MYLANTA) 200-200-20 MG/5ML suspension 30 mL  30 mL Oral Q4H PRN Okonkwo, Justina A, NP      . doxepin (SINEQUAN) capsule 25 mg  25 mg Oral QHS Eappen, Saramma, MD   25 mg at 06/08/17 2124  . hydrOXYzine (ATARAX/VISTARIL) tablet 50 mg  50 mg Oral Q6H PRN Kerry Hough, PA-C   50 mg at 06/09/17 0014  . magnesium hydroxide (MILK OF MAGNESIA) suspension 30 mL  30 mL Oral Daily PRN Okonkwo, Justina A, NP      . nicotine (NICODERM CQ - dosed in mg/24 hours) patch 21 mg  21 mg Transdermal Daily Armandina Stammer I, NP   21 mg at 06/09/17 0808  . OLANZapine zydis (ZYPREXA) disintegrating tablet 5 mg  5 mg Oral TID PRN Jomarie Longs, MD      . ondansetron (ZOFRAN-ODT) disintegrating tablet 4 mg  4 mg Oral TID PRN Jomarie Longs, MD   4 mg at 06/09/17 1147  . OXcarbazepine (TRILEPTAL) tablet 150 mg  150 mg Oral BID Jomarie Longs, MD   150 mg at 06/09/17 0807  . [START ON 06/10/2017] perphenazine (TRILAFON) tablet 8 mg  8 mg Oral QHS Nwoko, Agnes I, NP       Lab Results: No results found for this or any previous visit (from the past 48 hour(s)).  Blood Alcohol level:  Lab Results  Component Value Date   ETH <5 05/03/2016   Metabolic Disorder Labs: Lab Results  Component Value Date   HGBA1C 5.3 09/16/2016   MPG 105  09/16/2016   Lab Results  Component Value Date   PROLACTIN 27.7 (H) 05/04/2016   Lab Results  Component Value Date   CHOL 256 (H) 09/16/2016   TRIG 129 09/16/2016   HDL 54 09/16/2016   CHOLHDL  4.7 09/16/2016   VLDL 26 09/16/2016   LDLCALC 176 (H) 09/16/2016   LDLCALC 115 (H) 05/04/2016   Physical Findings: AIMS: Facial and Oral Movements Muscles of Facial Expression: None, normal Lips and Perioral Area: None, normal Jaw: None, normal Tongue: None, normal,Extremity Movements Upper (arms, wrists, hands, fingers): None, normal Lower (legs, knees, ankles, toes): None, normal, Trunk Movements Neck, shoulders, hips: None, normal, Overall Severity Severity of abnormal movements (highest score from questions above): None, normal Incapacitation due to abnormal movements: None, normal Patient's awareness of abnormal movements (rate only patient's report): No Awareness, Dental Status Current problems with teeth and/or dentures?: No Does patient usually wear dentures?: No  CIWA:  CIWA-Ar Total: 2 COWS:  COWS Total Score: 1  Musculoskeletal: Strength & Muscle Tone: within normal limits Gait & Station: normal Patient leans: N/A  Psychiatric Specialty Exam: Physical Exam: Nurses notes & Vital signs recieved.  Review of Systems  Psychiatric/Behavioral: Positive for substance abuse (Cannabis use disorder, Benzodizepine use). Negative for depression, hallucinations, memory loss and suicidal ideas. The patient is nervous/anxious and has insomnia.     Blood pressure 106/82, pulse 91, temperature 99.1 F (37.3 C), temperature source Oral, resp. rate 18, height 5\' 4"  (1.626 m), weight 103 kg (227 lb).Body mass index is 38.96 kg/m.  General Appearance: Casual  Eye Contact:  Fair  Speech:  Normal Rate  Volume:  Normal  Mood:  Anxious and Irritable  Affect:  Labile  Thought Process:  Goal Directed and Descriptions of Associations: Circumstantial  Orientation:  Full (Time, Place, and  Person)  Thought Content:  Rumination  Suicidal Thoughts:  Yes.  without intent/plan  Homicidal Thoughts:  No  Memory:  Immediate;   Fair Recent;   Fair Remote;   Fair  Judgement:  Impaired  Insight:  Fair  Psychomotor Activity:  Restlessness  Concentration:  Concentration: Fair and Attention Span: Fair  Recall:  [employer]Fair  Fund of Knowledge:  Fair  Language:  Fair  Akathisia:  No  Handed:  Right  AIMS (if indicated):     Assets:  Desire for Improvement Social Support  ADL's:  Intact  Cognition:  WNL  Sleep:  Number of Hours: 3.75.     Treatment Plan Summary: Daily contact with patient to assess and evaluate symptoms and progress in treatment:  -Continue Xanax 2 mg tid prn for anxiety. -Continue doxepin 25 mg Q hs for insomnia. -Continue Hydroxyzine 25 mg prn Q 6 hours for mild anxiety. -Continue Trileptal 150 mg bid for mood stabilization. -Changed perphenazine from 4 mg bid to 8 mg Q hs due to complain of excessive daytime drowsiness. Start on 06-10-17. -Continue routine prns for anxiety, psychosis & pain  management. -Resume lasix 20 mg daily for lower extremity swellings.  - Continue 15 minutes observation for safety concerns - Encouraged to participate in milieu therapy and group therapy counseling sessions and also work with coping skills -  Develop treatment plan to decrease risk of relapse upon discharge and to reduce the need for readmission. -  Psycho-social education regarding self care. - Health care follow up as needed for medical problems. - Restart home medications where appropriate.  Sanjuana KavaNwoko, Agnes I, NP, PMHNP, FNP-BC. 06/09/2017, 3:45 PM

## 2017-06-09 NOTE — Progress Notes (Signed)
Recreation Therapy Notes  INPATIENT RECREATION THERAPY ASSESSMENT  Patient Details Name: Brittany [name] MRN: [id number] DOB: [birth year] Today's Date: 06/09/2017  Patient Stressors: Family  Pt stated having an autistic son is a stressor. Pt stated she was here because her medications were changed, she couldn't get out of bed and was feeling depressed and suicidal.  Coping Skills:   Isolate, Arguments, Avoidance, Self-Injury, Exercise, Music  Personal Challenges: Anger, Concentration, Decision-Making, Problem-Solving, Relationships, Self-Esteem/Confidence, Stress Management, Time Management, Trusting Others  Leisure Interests (2+):  Music - Listen, Sports - Swimming, Technical brewerature - Other (Comment), Community - Other (Comment), Sports - Exercise (Comment) (Cardio/circuits; get tatoos; kayaking; go for a drive)  Awareness of Community Resources:  Yes  [facility]:  Library, [employer], Coffee Shop, Other (Comment) (Resource center)  Current Use: Yes  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

## 2017-06-09 NOTE — Progress Notes (Signed)
Recreation Therapy Notes  Date: 06/09/17 Time: 1000 Location: [address] Dayroom  Group Topic: Communication, Team Building, Problem Solving  Goal Area(s) Addresses:  Patient will effectively work with peer towards shared goal.  Patient will identify skill used to make activity successful.  Patient will identify how skills used during activity can be used to reach post d/c goals.   Behavioral Response: Engaged  Intervention: STEM Activity   Activity: [place]. Patients were provided the following materials: 5 drinking straws, 5 rubber bands, 5 paper clips, 2 index cards, 2 drinking cups, and 2 toilet paper rolls. Using the provided materials patients were asked to build a launching mechanisms to launch a ping pong ball approximately 12 feet. Patients were divided into teams of 3-5.   Education: Pharmacist, communityocial Skills, Building control surveyorDischarge Planning.   Education Outcome: Acknowledges education/In group clarification offered/Needs additional education.   Clinical Observations/Feedback: Pt arrived late to group but became fully engaged with the activity.  Pt was social and engaged with peers.  Pt stated using the skills from group post d/c helped her understand "it's ok to talk to people about your problems because they may have already gone through it and you may be able to use what they did to overcome your situation".    Caroll RancherMarjette Keeva Reisen, LRT/CTRS       Caroll RancherLindsay, Alexiah Koroma A 06/09/2017 12:04 PM

## 2017-06-09 NOTE — Plan of Care (Signed)
Problem: Medication: Goal: Compliance with prescribed medication regimen will improve Outcome: Progressing Patient is compliant with medication regimen.   

## 2017-06-10 LAB — LIPID PANEL
Cholesterol: 189 mg/dL (ref 0–200)
HDL: 37 mg/dL — ABNORMAL LOW (ref >40)
LDL CALC: 117 mg/dL — AB (ref 0–99)
TRIGLYCERIDES: 175 mg/dL — AB (ref <150)
Total CHOL/HDL Ratio: 5.1 RATIO
VLDL: 35 mg/dL (ref 0–40)

## 2017-06-10 LAB — HEMOGLOBIN A1C
HEMOGLOBIN A1C: 5 % (ref 4.8–5.6)
MEAN PLASMA GLUCOSE: 96.8 mg/dL

## 2017-06-10 LAB — TSH: TSH: 2.592 u[IU]/mL (ref 0.350–4.500)

## 2017-06-10 MED ORDER — LORATADINE 10 MG PO TABS
10.0000 mg | ORAL_TABLET | Freq: Every day | ORAL | Status: DC
  Administered 2017-06-10 – 2017-06-14 (×5): 10 mg via ORAL
  Filled 2017-06-10 (×9): qty 1

## 2017-06-10 NOTE — Progress Notes (Signed)
Recreation Therapy Notes  Date: 06/10/17 Time: 1000 Location: 500 [address] Dayroom  Group Topic: Leisure Education  Goal Area(s) Addresses:  Patient will identify positive leisure activities.  Patient will identify one positive benefit of participation in leisure activities.   Intervention: Chairs, small beach ball  Activity: Keep It Going Volleyball.  Patients were arranged in a circle.  Patients were to pass the ball back and forth to each other.  Patients could bounce the ball off of the floor but the ball could not come to a complete stop.  LRT would count the number of hits the group was able to get before the ball stopped.  It the ball stopped moving at any point, LRT would start the count over.  Education:  Leisure Education, Building control surveyorDischarge Planning  Education Outcome: Acknowledges education/In group clarification offered/Needs additional education  Clinical Observations/Feedback: Pt did not attend group.     Caroll RancherMarjette Erva Koke, LRT/CTRS         Caroll RancherLindsay, Venkat Ankney A 06/10/2017 11:46 AM

## 2017-06-10 NOTE — Progress Notes (Signed)
DAR NOTE: Patient presents with anxious affect and depressed mood.  Denies pain, auditory and visual hallucinations.  Reports suicidal thoughts on self inventory form but contracts for safety when assessed.  Rates depression at 9, hopelessness at 5, and anxiety at 5.  Maintained on routine safety checks.  Medications given as prescribed.  Support and encouragement offered as needed.  Attended group and participated.  States goal for today is "group."  Patient observed socializing with peers in the dayroom.  Xanax 2 mg given for complain of anxiety with good effect.

## 2017-06-10 NOTE — Progress Notes (Signed)
[facility] Group Notes:  (Nursing/MHT/Case Management/Adjunct)  Date:  06/10/2017  Time:  8:52 PM  Type of Therapy:  Psychoeducational Skills  Participation Level:  Active  Participation Quality:  Appropriate  Affect:  Appropriate  Cognitive:  Appropriate  Insight:  Appropriate  Engagement in Group:  Engaged  Modes of Intervention:  Education  Summary of Progress/Problems: Patient verbalized in group that she was pleased with the fact that she was finally visited by her husband and son this evening. She stated that she had not seen her husband since Monday. In terms of the theme for the day, her coping skill will be to listen to music and to go kayaking.   [name], [name] 06/10/2017, 8:52 PM

## 2017-06-10 NOTE — Progress Notes (Signed)
[facility] MD Progress Note  06/10/2017 12:41 PM Brittany [name]  MRN:  [id number]  Subjective: Brittany [name] reports, "I'm still feeling very sleepy today. The sleep is very good because I need it. I was up for 4 days without sleep prior to being hospitalized. However, if I continue to sleep like this after discharge, I will never function normally, go to school & take care of my family. This is also the first time that I'm in the hospital for more than 3 days. Usually, by day 2 of being hospitalized, I will be already asking to be discharged.  I believe that the antipsychotic & the mood stabilizer medicines are making me feel too sleepy, they should be cut in half. It is not the Xanax pills making me sleepy, they do not make me sleepy. I have been taking them for a long time"   Objective: Pt with Bipolar do, BPD, ADHD, Panic attacks, presented after she had a bad reaction to trintillex Per husband. Brittany [name] is seen, chart reviewed. She is more alert, verbally responsive. He speech today is very coordinated & clearer than yesterday. She is visible on the unit today, participating in the group sessions. She is still complaining of feeling very sleepy, however, not very drowsy today. She continues blaming sleepiness on the perphenazine. She wants the dose decreased. She is reminded again today that sleepy could be as a result of the high dose of the Xanax (2 mg) three times a day she is receiving on the clock & by the clock. Brittany [name] remains adamant about staying on the Xanax 2 mg tid. She does not want to any one changing the dose. The perphenazine 8 mg dose Q hs has been decreased to 6 mg Q hs starting tonight. She denies any SIHI, AVH, delusional thoughts or paranoia. She was complaining of bilateral lower extremity swelling yesterday. Resumed her lasix 20 mg daily dose yesterday. She says the swellings are better today, She rates her depression at #3 & anxiety #5. Brittany [name] presents with a reactive & improved  affect, good eye contact noted as well. She does not appear to be responding to any internal stimuli or in any apparent distress.  Principal Problem: Bipolar disorder, curr episode mixed, severe, with psychotic features (HCC)  Diagnosis:   Patient Active Problem List   Diagnosis Date Noted  . Bipolar disorder, curr episode mixed, severe, with psychotic features (HCC) [F31.64] 06/08/2017  . ADHD (attention deficit hyperactivity disorder) [F90.9] 09/15/2016  . Borderline personality disorder [F60.3] 05/04/2016  . Overdose [T50.901A] 05/03/2016  . Noncompliance [Z91.19] 05/03/2016   Total Time spent with patient: 25 minutes  Past Psychiatric History: Bipolar affective disorder.  Past Medical History:  Past Medical History:  Diagnosis Date  . ADHD (attention deficit hyperactivity disorder)   . Anxiety   . Arthritis    right shoulder, bilateral knees  . Bipolar disorder (HCC)   . Bipolar I disorder, most recent episode depressed (HCC)   . Chronic kidney disease    Kidney stones  . Depression   . Headache     Past Surgical History:  Procedure Laterality Date  . CESAREAN SECTION    . CHOLECYSTECTOMY  2017  . FOOT SURGERY    . KIDNEY SURGERY    . NOVASURE ABLATION  2010  . SHOULDER SURGERY Right   . TUBAL LIGATION     Family History:  Family History  Problem Relation Age of Onset  . Anxiety disorder Mother   . Depression Mother   .  Drug abuse Mother   . Bipolar disorder Mother   . Depression Father   . Anxiety disorder Father   . Drug abuse Father   . Anxiety disorder Sister   . Depression Sister   . ADD / ADHD Sister   . Anxiety disorder Sister   . Depression Sister    Family Psychiatric  History: See H&P.  Social History:  History  Alcohol Use  . 2.4 - 7.2 oz/week  . 4 Glasses of wine per week    Comment: "couple weeks"     History  Drug Use  . Types: Marijuana    Comment: last used about 3 weeks ago    Social History   Social History  . Marital  status: Married    Spouse name: N/A  . Number of children: N/A  . Years of education: N/A   Social History Main Topics  . Smoking status: Former Smoker    Types: E-cigarettes  . Smokeless tobacco: Never Used  . Alcohol use 2.4 - 7.2 oz/week    4 Glasses of wine per week     Comment: "couple weeks"  . Drug use: Yes    Types: Marijuana     Comment: last used about 3 weeks ago  . Sexual activity: Yes    Birth control/ protection: None   Other Topics Concern  . None   Social History Narrative  . None   Additional Social History:    Pain Medications: see PTA meds Prescriptions: see PTA meds Over the Counter: see PTA meds History of alcohol / drug use?: Yes Longest period of sobriety (when/how long): 3 weeks Name of Substance 1: thc 1 - Age of First Use: 18 1 - Amount (size/oz): vaires 1 - Frequency: once monthly 1 - Duration: on going  1 - Last Use / Amount: couple weeks ago Name of Substance 2: etoh 2 - Age of First Use: 19 yrs 2 - Frequency: twice monthly 2 - Duration: on going 2 - Last Use / Amount: 2 weeks ago  Sleep: "I'm sleeping too much"  Appetite:  Fair  Current Medications: Current Facility-Administered Medications  Medication Dose Route Frequency Provider Last Rate Last Dose  . acetaminophen (TYLENOL) tablet 650 mg  650 mg Oral Q6H PRN Okonkwo, Justina A, NP      . ALPRAZolam Prudy Feeler(XANAX) tablet 2 mg  2 mg Oral TID PRN Jomarie LongsEappen, Saramma, MD   2 mg at 06/10/17 0843  . alum & mag hydroxide-simeth (MAALOX/MYLANTA) 200-200-20 MG/5ML suspension 30 mL  30 mL Oral Q4H PRN Okonkwo, Justina A, NP      . doxepin (SINEQUAN) capsule 25 mg  25 mg Oral QHS Eappen, Saramma, MD   25 mg at 06/09/17 2037  . furosemide (LASIX) tablet 20 mg  20 mg Oral Daily Armandina StammerNwoko, [place] I, NP   20 mg at 06/10/17 0840  . hydrOXYzine (ATARAX/VISTARIL) tablet 50 mg  50 mg Oral Q6H PRN Kerry HoughSimon, Spencer E, PA-C   50 mg at 06/09/17 0014  . loratadine (CLARITIN) tablet 10 mg  10 mg Oral Daily Armandina StammerNwoko, Vicent Febles  I, NP   10 mg at 06/10/17 1216  . magnesium hydroxide (MILK OF MAGNESIA) suspension 30 mL  30 mL Oral Daily PRN Okonkwo, Justina A, NP      . nicotine (NICODERM CQ - dosed in mg/24 hours) patch 21 mg  21 mg Transdermal Daily Armandina StammerNwoko, Krysten Veronica I, NP   21 mg at 06/10/17 0845  . OLANZapine zydis (ZYPREXA) disintegrating tablet 5  mg  5 mg Oral TID PRN Jomarie Longs, MD      . ondansetron (ZOFRAN-ODT) disintegrating tablet 4 mg  4 mg Oral TID PRN Jomarie Longs, MD   4 mg at 06/09/17 2038  . OXcarbazepine (TRILEPTAL) tablet 150 mg  150 mg Oral BID Jomarie Longs, MD   150 mg at 06/10/17 0840  . perphenazine (TRILAFON) tablet 8 mg  8 mg Oral QHS Gratia Disla, Nicole Kindred I, NP       Lab Results:  Results for orders placed or performed during the hospital encounter of 06/08/17 (from the past 48 hour(s))  TSH     Status: None   Collection Time: 06/10/17  6:13 AM  Result Value Ref Range   TSH 2.592 0.350 - 4.500 uIU/mL    Comment: Performed by a 3rd Generation assay with a functional sensitivity of <=0.01 uIU/mL. Performed at [facility], [address], [place], [place] [address]   Lipid panel     Status: Abnormal   Collection Time: 06/10/17  6:13 AM  Result Value Ref Range   Cholesterol 189 0 - 200 mg/dL   Triglycerides 191 (H) <150 mg/dL   HDL 37 (L) >47 mg/dL   Total CHOL/HDL Ratio 5.1 RATIO   VLDL 35 0 - 40 mg/dL   LDL Cholesterol 829 (H) 0 - 99 mg/dL    Comment:        Total Cholesterol/HDL:CHD Risk Coronary Heart Disease Risk Table                     Men   Women  1/2 Average Risk   3.4   3.3  Average Risk       5.0   4.4  2 X Average Risk   9.6   7.1  3 X Average Risk  23.4   11.0        Use the calculated Patient Ratio above and the CHD Risk Table to determine the patient's CHD Risk.        ATP III CLASSIFICATION (LDL):  <100     mg/dL   Optimal  562-130  mg/dL   Near or Above                    Optimal  130-159  mg/dL   Borderline  865-784  mg/dL   High  >696      mg/dL   Very High Performed at [facility], [address] [place], [place] [address]   Hemoglobin A1c     Status: None   Collection Time: 06/10/17  6:13 AM  Result Value Ref Range   Hgb A1c MFr Bld 5.0 4.8 - 5.6 %    Comment: (NOTE) Pre diabetes:          5.7%-6.4% Diabetes:              >6.4% Glycemic control for   <7.0% adults with diabetes    Mean Plasma Glucose 96.8 mg/dL    Comment: Performed at [facility], [address], [place], [place] [address]    Blood Alcohol level:  Lab Results  Component Value Date   [facility] <5 05/03/2016   Metabolic Disorder Labs: Lab Results  Component Value Date   HGBA1C 5.0 06/10/2017   MPG 96.8 06/10/2017   MPG 105 09/16/2016   Lab Results  Component Value Date   PROLACTIN 27.7 (H) 05/04/2016   Lab Results  Component Value Date   CHOL  189 06/10/2017   TRIG 175 (H) 06/10/2017   HDL 37 (L) 06/10/2017   CHOLHDL 5.1 06/10/2017   VLDL 35 06/10/2017   LDLCALC 117 (H) 06/10/2017   LDLCALC 176 (H) 09/16/2016   Physical Findings: AIMS: Facial and Oral Movements Muscles of Facial Expression: None, normal Lips and Perioral Area: None, normal Jaw: None, normal Tongue: None, normal,Extremity Movements Upper (arms, wrists, hands, fingers): None, normal Lower (legs, knees, ankles, toes): None, normal, Trunk Movements Neck, shoulders, hips: None, normal, Overall Severity Severity of abnormal movements (highest score from questions above): None, normal Incapacitation due to abnormal movements: None, normal Patient's awareness of abnormal movements (rate only patient's report): No Awareness, Dental Status Current problems with teeth and/or dentures?: No Does patient usually wear dentures?: No  CIWA:  CIWA-Ar Total: 2 COWS:  COWS Total Score: 1  Musculoskeletal: Strength & Muscle Tone: within normal limits Gait & Station: normal Patient leans: N/A  Psychiatric Specialty Exam: Physical Exam: Nurses notes & Vital signs  recieved.  Review of Systems  Psychiatric/Behavioral: Positive for depression ("Improving", rates #3) and substance abuse (Cannabis use disorder, Benzodizepine use). Negative for hallucinations, memory loss and suicidal ideas. The patient is nervous/anxious. The patient does not have insomnia.     Blood pressure 108/82, pulse 91, temperature 98.8 F (37.1 C), temperature source Oral, resp. rate 20, height 5\' 4"  (1.626 m), weight 103 kg (227 lb).Body mass index is 38.96 kg/m.  General Appearance: Casual  Eye Contact:  Fair  Speech:  Normal Rate  Volume: Normal  Mood: "Improving", rates #3  Affect: Labile, but improving.  Thought Process:  Goal Directed and Descriptions of Associations: Circumstantial  Orientation:  Full (Time, Place, and Person)  Thought Content: Rumination, denies any hallucinations, delusions or paranoia.  Suicidal Thoughts: Currently denies any thoughts, plans or intent.  Homicidal Thoughts: Denies.  Memory:  Immediate; Good Recent; Good Remote;   Fair  Judgement:  IImproved.  Insight:  Fair  Psychomotor Activity: Normal.  Concentration:  Concentration: Fair and Attention Span: Fair  Recall:  [employer] of Knowledge:  Fair  Language:  Fair  Akathisia:  No  Handed:  Right  AIMS (if indicated):     Assets:  Desire for Improvement Social Support  ADL's:  Intact  Cognition:  WNL  Sleep:  Number of Hours: 3.75.     Treatment Plan Summary: Daily contact with patient to assess and evaluate symptoms and progress in treatment:  Will continue today 06/10/17 plan as below except where it is noted.  -Continue Xanax 2 mg tid prn for anxiety. -Continue doxepin 25 mg Q hs for insomnia. -Continue Hydroxyzine 25 mg prn Q 6 hours for mild anxiety. -Continue Trileptal 150 mg bid for mood stabilization. -Decreased the dose of perphenazine from 8 mg down to 6 mg Q hs due to complain of excessive daytime drowsiness. Start on 06-10-17. -Continue routine prns for anxiety,  psychosis & pain  management. -Continue lasix 20 mg daily for lower extremity swellings. -Initiate Claritin 10 mg daily for itching. -Obtain CMP in the morning.  - Continue 15 minutes observation for safety concerns - Encouraged to participate in milieu therapy and group therapy counseling sessions and also work with coping skills -  Develop treatment plan to decrease risk of relapse upon discharge and to reduce the need for readmission. -  Psycho-social education regarding self care. - Health care follow up as needed for medical problems. - Restart home medications where appropriate.  Sanjuana Kava, NP, PMHNP, FNP-BC.  06/10/2017, 12:41 PMPatient ID: Brittany [name], female   DOB: 1986-09-07, 31 y.o.   MRN: [id number]

## 2017-06-10 NOTE — [facility] Group Notes (Signed)
[facility] [name] Group Therapy  06/10/2017  1:05 PM  Type of Therapy:  Group therapy  Participation Level:  Active  Participation Quality:  Attentive  Affect:  Flat  Cognitive:  Oriented  Insight:  Limited  Engagement in Therapy:  Limited  Modes of Intervention:  Discussion, Socialization  Summary of Progress/Problems:  Chaplain was here to lead a group on themes of hope and courage.  "Self care means love and acceptance for oneself.  It means doing the things for myself that make me feel good, like Calgone, candles, and music."  In and out several times, explained that she has problems with anxiety, and there was another patient who was particularly intrusive.  Daryel Geraldorth, Denise Washburn B 06/10/2017 1:19 PM .

## 2017-06-10 NOTE — Progress Notes (Addendum)
Patient ID: [name], female   DOB: 11-11-1985, 31 y.o.   MRN: [id number]  Pt currently presents with an anxious affect and impulsive, intrusive behavior. Pt preoccupied with Xanax and her medication schedule during interaction tonight. Pt wishes to take medication with meal during the day, also requests to take it before bedtime and to be awoken for administration. Pt states tearfully "people are wondering why I won't just come home. I need to be here to get my mood right." Reports increased sadness over seeing her husband during visitation. Pt reports good sleep with current medication regimen.   Pt provided with medications per providers orders. Pt's labs and vitals were monitored throughout the night. Pt given a 1:1 about emotional and mental status. Pt supported and encouraged to express concerns and questions. Pt educated on medications and the importance of adhering to a medication regimen.   Pt's safety ensured with 15 minute and environmental checks. Pt currently denies SI/HI and A/V hallucinations. Pt verbally agrees to seek staff if SI/HI or A/VH occurs and to consult with staff before acting on any harmful thoughts. Pt behavior continues to be impulsive, frequently asks about confidential information of other patients. Will continue POC.

## 2017-06-11 LAB — PROLACTIN: Prolactin: 57.7 ng/mL — ABNORMAL HIGH (ref 4.8–23.3)

## 2017-06-11 NOTE — [facility] Group Notes (Signed)
  [facility] [name] Group Therapy Note  Date/Time:  06/11/2017 11:15AM-12:00PM  Type of Therapy and Topic:  Group Therapy:  Feelings About Hospitalization  Participation Level:  Active   Description of Group This process group involved patients discussing their feelings related to being hospitalized, as well as the benefits they see to being in the hospital.  These feelings and benefits were itemized.  The group then brainstormed specific ways in which they could seek those same benefits when they discharge and return home.  Therapeutic Goals 1. Patient will identify and describe positive and negative feelings related to hospitalization 2. Patient will verbalize benefits of hospitalization to themselves personally 3. Patients will brainstorm together ways they can obtain similar benefits in the outpatient setting, identify barriers to wellness and possible solutions  Summary of Patient Progress:  The patient expressed her primary feelings about being hospitalized are more positive than ever before, because out of the 17-18 hospitalizations she has had, she never before intended to "make the most of the opportunity" like this time.  Therapeutic Modalities Cognitive Behavioral Therapy Motivational Interviewing    Ambrose MantleMareida Grossman-Orr, [name] 06/11/2017, 1:03 PM

## 2017-06-11 NOTE — Progress Notes (Signed)
[facility]  06/11/2017 3:29 PM Brittany BuffyChristine Gates  MRN:  [id number]  Subjective: Brittany [name] reports, "I did not sleep a wink last night. I think, it is because I did not have my Xanax pill last night. I need my Xanax scheduled so that I can get a 1 pill prior to going to bed  Objective: Pt with Bipolar do, BPD, ADHD, Panic attacks, presented after she had a bad reaction to trintillex Per husband.  Brittany [name] is seen, chart reviewed. She is alert, verbally responsive. Her speech today remains coordinated & clear. She is visible on the unit today, participating in the group sessions. She is complaining that she did not sleep last night. She wants her 2 mg Xanax Scheduled so that she can get a pill prior to going to bed.. Her request was declined. She is reminded that her Xanax in ordered on as needed basis & because it was ordered tid prn, this is translated as every 8 hours prn. Brittany [name] remains adamant about staying on the Xanax 2 mg tid. She does not want any one changing the dose. The perphenazine 8 mg dose Q hs has been changed back to 8 mg Q hs starting tonight. She denies any SIHI, AVH, delusional thoughts or paranoia.  She says the swellings are still better today. She rates her depression at #4 & anxiety #5. Brittany [name] presents with a reactive & improved affect, good eye contact noted as well. She does not appear to be responding to any internal stimuli or in any apparent distress.  Principal Problem: Bipolar disorder, curr episode mixed, severe, with psychotic features (HCC)  Diagnosis:   Patient Active Problem List   Diagnosis Date Noted  . Bipolar disorder, curr episode mixed, severe, with psychotic features (HCC) [F31.64] 06/08/2017  . ADHD (attention deficit hyperactivity disorder) [F90.9] 09/15/2016  . Borderline personality disorder [F60.3] 05/04/2016  . Overdose [T50.901A] 05/03/2016  . Noncompliance [Z91.19] 05/03/2016   Total Time spent with patient: 15 minutes  Past  Psychiatric History: Bipolar affective disorder.  Past Medical History:  Past Medical History:  Diagnosis Date  . ADHD (attention deficit hyperactivity disorder)   . Anxiety   . Arthritis    right shoulder, bilateral knees  . Bipolar disorder (HCC)   . Bipolar I disorder, most recent episode depressed (HCC)   . Chronic kidney disease    Kidney stones  . Depression   . Headache     Past Surgical History:  Procedure Laterality Date  . CESAREAN SECTION    . CHOLECYSTECTOMY  2017  . FOOT SURGERY    . KIDNEY SURGERY    . NOVASURE ABLATION  2010  . SHOULDER SURGERY Right   . TUBAL LIGATION     Family History:  Family History  Problem Relation Age of Onset  . Anxiety disorder Mother   . Depression Mother   . Drug abuse Mother   . Bipolar disorder Mother   . Depression Father   . Anxiety disorder Father   . Drug abuse Father   . Anxiety disorder Sister   . Depression Sister   . ADD / ADHD Sister   . Anxiety disorder Sister   . Depression Sister    Family Psychiatric  History: See H&P.  Social History:  History  Alcohol Use  . 2.4 - 7.2 oz/week  . 4 Glasses of wine per week    Comment: "couple weeks"     History  Drug Use  . Types: Marijuana    Comment:  last used about 3 weeks ago    Social History   Social History  . Marital status: Married    Spouse name: N/A  . Number of children: N/A  . Years of education: N/A   Social History Main Topics  . Smoking status: Former Smoker    Types: E-cigarettes  . Smokeless tobacco: Never Used  . Alcohol use 2.4 - 7.2 oz/week    4 Glasses of wine per week     Comment: "couple weeks"  . Drug use: Yes    Types: Marijuana     Comment: last used about 3 weeks ago  . Sexual activity: Yes    Birth control/ protection: None   Other Topics Concern  . None   Social History Narrative  . None   Additional Social History:    Pain Medications: see PTA meds Prescriptions: see PTA meds Over the Counter: see PTA  meds History of alcohol / drug use?: Yes Longest period of sobriety (when/how long): 3 weeks Name of Substance 1: thc 1 - Age of First Use: 18 1 - Amount (size/oz): vaires 1 - Frequency: once monthly 1 - Duration: on going  1 - Last Use / Amount: couple weeks ago Name of Substance 2: etoh 2 - Age of First Use: 19 yrs 2 - Frequency: twice monthly 2 - Duration: on going 2 - Last Use / Amount: 2 weeks ago  Sleep: "I'm sleeping too much"  Appetite:  Fair  Current Medications: Current Facility-Administered Medications  Medication Dose Route Frequency Provider Last Rate Last Dose  . acetaminophen (TYLENOL) tablet 650 mg  650 mg Oral Q6H PRN Okonkwo, Justina A, NP      . ALPRAZolam Prudy Feeler) tablet 2 mg  2 mg Oral TID PRN Jomarie Longs, MD   2 mg at 06/11/17 0605  . alum & mag hydroxide-simeth (MAALOX/MYLANTA) 200-200-20 MG/5ML suspension 30 mL  30 mL Oral Q4H PRN Okonkwo, Justina A, NP      . doxepin (SINEQUAN) capsule 25 mg  25 mg Oral QHS Eappen, Saramma, MD   25 mg at 06/10/17 2050  . furosemide (LASIX) tablet 20 mg  20 mg Oral Daily Nwoko, Nicole Kindred I, NP   20 mg at 06/11/17 0859  . hydrOXYzine (ATARAX/VISTARIL) tablet 50 mg  50 mg Oral Q6H PRN Donell Sievert E, PA-C   50 mg at 06/11/17 1150  . loratadine (CLARITIN) tablet 10 mg  10 mg Oral Daily Armandina Stammer I, NP   10 mg at 06/11/17 0859  . magnesium hydroxide (MILK OF MAGNESIA) suspension 30 mL  30 mL Oral Daily PRN Okonkwo, Justina A, NP      . nicotine (NICODERM CQ - dosed in mg/24 hours) patch 21 mg  21 mg Transdermal Daily Armandina Stammer I, NP   21 mg at 06/11/17 0902  . OLANZapine zydis (ZYPREXA) disintegrating tablet 5 mg  5 mg Oral TID PRN Jomarie Longs, MD      . ondansetron (ZOFRAN-ODT) disintegrating tablet 4 mg  4 mg Oral TID PRN Jomarie Longs, MD   4 mg at 06/11/17 1150  . OXcarbazepine (TRILEPTAL) tablet 150 mg  150 mg Oral BID Jomarie Longs, MD   150 mg at 06/11/17 0859  . perphenazine (TRILAFON) tablet 8 mg  8 mg  Oral QHS Armandina Stammer I, NP   8 mg at 06/10/17 2050   Lab Results:  Results for orders placed or performed during the hospital encounter of 06/08/17 (from the past 48 hour(s))  Prolactin  Status: Abnormal   Collection Time: 06/10/17  6:13 AM  Result Value Ref Range   Prolactin 57.7 (H) 4.8 - 23.3 ng/mL    Comment: (NOTE) Performed At: [facility] [address] [place], [place] [id number] Mila Homer MD [id number] Performed at [facility], [address] [place], [place] [address]   TSH     Status: None   Collection Time: 06/10/17  6:13 AM  Result Value Ref Range   TSH 2.592 0.350 - 4.500 uIU/mL    Comment: Performed by a 3rd Generation assay with a functional sensitivity of <=0.01 uIU/mL. Performed at [facility], [address] [place], [place] [address]   Lipid panel     Status: Abnormal   Collection Time: 06/10/17  6:13 AM  Result Value Ref Range   Cholesterol 189 0 - 200 mg/dL   Triglycerides 784 (H) <150 mg/dL   HDL 37 (L) >69 mg/dL   Total CHOL/HDL Ratio 5.1 RATIO   VLDL 35 0 - 40 mg/dL   LDL Cholesterol 629 (H) 0 - 99 mg/dL    Comment:        Total Cholesterol/HDL:CHD Risk Coronary Heart Disease Risk Table                     Men   Women  1/2 Average Risk   3.4   3.3  Average Risk       5.0   4.4  2 X Average Risk   9.6   7.1  3 X Average Risk  23.4   11.0        Use the calculated Patient Ratio above and the CHD Risk Table to determine the patient's CHD Risk.        ATP III CLASSIFICATION (LDL):  <100     mg/dL   Optimal  528-413  mg/dL   Near or Above                    Optimal  130-159  mg/dL   Borderline  244-010  mg/dL   High  >272     mg/dL   Very High Performed at [facility], [address], [place], [place] [address]   Hemoglobin A1c     Status: None   Collection Time: 06/10/17  6:13 AM  Result Value Ref Range   Hgb A1c MFr Bld 5.0 4.8 - 5.6 %    Comment: (NOTE) Pre  diabetes:          5.7%-6.4% Diabetes:              >6.4% Glycemic control for   <7.0% adults with diabetes    Mean Plasma Glucose 96.8 mg/dL    Comment: Performed at [facility], [address] [place], [place] [address]    Blood Alcohol level:  Lab Results  Component Value Date   [facility] <5 05/03/2016   Metabolic Disorder Labs: Lab Results  Component Value Date   HGBA1C 5.0 06/10/2017   MPG 96.8 06/10/2017   MPG 105 09/16/2016   Lab Results  Component Value Date   PROLACTIN 57.7 (H) 06/10/2017   PROLACTIN 27.7 (H) 05/04/2016   Lab Results  Component Value Date   CHOL 189 06/10/2017   TRIG 175 (H) 06/10/2017   HDL 37 (L) 06/10/2017   CHOLHDL 5.1 06/10/2017   VLDL 35 06/10/2017   LDLCALC 117 (H) 06/10/2017   LDLCALC 176 (H) 09/16/2016  Physical Findings: AIMS: Facial and Oral Movements Muscles of Facial Expression: None, normal Lips and Perioral Area: None, normal Jaw: None, normal Tongue: None, normal,Extremity Movements Upper (arms, wrists, hands, fingers): None, normal Lower (legs, knees, ankles, toes): None, normal, Trunk Movements Neck, shoulders, hips: None, normal, Overall Severity Severity of abnormal movements (highest score from questions above): None, normal Incapacitation due to abnormal movements: None, normal Patient's awareness of abnormal movements (rate only patient's report): No Awareness, Dental Status Current problems with teeth and/or dentures?: No Does patient usually wear dentures?: No  CIWA:  CIWA-Ar Total: 2 COWS:  COWS Total Score: 1  Musculoskeletal: Strength & Muscle Tone: within normal limits Gait & Station: normal Patient leans: N/A  Psychiatric Specialty Exam: Physical Exam: Nurses notes & Vital signs recieved.  Review of Systems  Psychiatric/Behavioral: Positive for depression ("Improving", rates #3) and substance abuse (Cannabis use disorder, Benzodizepine use). Negative for hallucinations, memory loss and suicidal  ideas. The patient is nervous/anxious. The patient does not have insomnia.     Blood pressure 116/80, pulse 79, temperature 97.9 F (36.6 C), temperature source Oral, resp. rate 16, height 5\' 4"  (1.626 m), weight 103 kg (227 lb).Body mass index is 38.96 kg/m.  General Appearance: Casual  Eye Contact:  Fair  Speech:  Normal Rate  Volume: Normal  Mood: "Improving", rates #3  Affect: Labile, but improving.  Thought Process:  Goal Directed and Descriptions of Associations: Circumstantial  Orientation:  Full (Time, Place, and Person)  Thought Content: Rumination, denies any hallucinations, delusions or paranoia.  Suicidal Thoughts: Currently denies any thoughts, plans or intent.  Homicidal Thoughts: Denies.  Memory:  Immediate; Good Recent; Good Remote;   Fair  Judgement:  IImproved.  Insight:  Fair  Psychomotor Activity: Normal.  Concentration:  Concentration: Fair and Attention Span: Fair  Recall:  [employer] of Knowledge:  Fair  Language:  Fair  Akathisia:  No  Handed:  Right  AIMS (if indicated):     Assets:  Desire for Improvement Social Support  ADL's:  Intact  Cognition:  WNL  Sleep:  Number of Hours: 3.75.     Treatment Plan Summary: Daily contact with patient to assess and evaluate symptoms and progress in treatment:  Will continue today 06/10/17 plan as below except where it is noted.  -Continue Xanax 2 mg tid prn for anxiety. -Continue doxepin 25 mg Q hs for insomnia. -Continue Hydroxyzine 25 mg prn Q 6 hours for mild anxiety. -Continue Trileptal 150 mg bid for mood stabilization. -Reinstated the dose of perphenazine from 8 mg Q hs due to complain of lack of sleep last night. -Continue routine prns for anxiety, psychosis & pain  management. -Continue lasix 20 mg daily for lower extremity swellings. -Continue Claritin 10 mg daily for itching. -Reviewed CMP results, pending.  - Continue 15 minutes observation for safety concerns - Encouraged to participate in  milieu therapy and group therapy counseling sessions and also work with coping skills -  Develop treatment plan to decrease risk of relapse upon discharge and to reduce the need for readmission. -  Psycho-social education regarding self care. - Health care follow up as needed for medical problems. - Restart home medications where appropriate.  Sanjuana Kava, NP, PMHNP, FNP-BC. 06/11/2017, 3:29 PMPatient ID: Brittany [name], female   DOB: 1985/10/22, 31 y.o.   MRN: [id number]

## 2017-06-11 NOTE — [facility] Group Notes (Signed)
Adult Psychoeducational Group Note  Date:  06/11/2017 Time:  8:00pm Group Topic/Focus:  Wrap-Up Group:   The focus of this group is to help patients review their daily goal of treatment and discuss progress on daily workbooks.  Participation Level:  Active  Participation Quality:  Appropriate and Attentive  Affect:  Appropriate  Cognitive:  Alert and Appropriate  Insight: Appropriate  Engagement in Group:  Engaged  Modes of Intervention:  Discussion  Additional Comments:  Pt was appropriate and attentive during tonights group discussion. Pt shared that she was in bad mood however she was able to turn things around today and be active. Pt shared that her family is supportive.   Bing PlumeScott, Brittany [name] 06/11/2017, 9:18 PM

## 2017-06-11 NOTE — Progress Notes (Signed)
Patient ID: Brittany [name], female   DOB: 11-05-1985, 31 y.o.   MRN: [id number]  Pt awakens to take a shower. Takes a shower and then returns to bed after bringing up her dirty clothes to be washed.

## 2017-06-11 NOTE — Progress Notes (Signed)
DAR NOTE: Patient presents with anxious affect and mood.  Patient continues to be tearful and sad throughout the day.  Reports staff not giving her the right medication.  Discussed medication management with nurse practitioner.  Denies pain, suicidal thoughts, auditory and visual hallucinations.  Described energy level as normal and concentration as poor.  Rates depression at 5, hopelessness at 5, and anxiety at 0.  Maintained on routine safety checks.  Medications given as prescribed.  Support and encouragement offered as needed.  Attended group and participated.  States goal for today is "getting my medicine right."  Patient observed socializing with peers in the dayroom.  Vistaril 50 mg and Zofran 4 mg given for complain of anxiety and nausea with good effect.

## 2017-06-11 NOTE — [facility] Group Notes (Signed)
[facility] Group Notes:  (Nursing/MHT/Case Management/Adjunct)  Date:  06/11/2017  Time:  1:55 PM  Type of Therapy:  Nurse Education  Participation Level:  Active  Participation Quality:  Appropriate and Attentive  Affect:  Appropriate  Cognitive:  Alert and Oriented  Insight:  Appropriate  Engagement in Group:  Engaged  Modes of Intervention:  Discussion and Education  Summary of Progress/Problems: Coping Skills Group: Patient stated that her best coping skill is putting other people back together and helping their light shine,  and that a positive attribute she has is caring .  Brittany BergamoBarbara M Yaneli Keithley 06/11/2017, 1:55 PM

## 2017-06-11 NOTE — Plan of Care (Signed)
Problem: Coping: Goal: Ability to demonstrate self-control will improve Outcome: Progressing Pt is able to cope with anxiety this morning without requesting medications by taking a shower instead

## 2017-06-11 NOTE — Progress Notes (Signed)
Patient ID: Brittany [name], female   DOB: 1986/01/26, 31 y.o.   MRN: [id number]  Pt awakens and approaches nurses station. Requests to take "my medication." Pt told that it was not administrable at this time. Pt asks for water and then returns to bed.

## 2017-06-12 LAB — COMPREHENSIVE METABOLIC PANEL
ALBUMIN: 3.8 g/dL (ref 3.5–5.0)
ALT: 36 U/L (ref 14–54)
AST: 24 U/L (ref 15–41)
Alkaline Phosphatase: 58 U/L (ref 38–126)
Anion gap: 7 (ref 5–15)
BUN: 11 mg/dL (ref 6–20)
CALCIUM: 9.1 mg/dL (ref 8.9–10.3)
CO2: 27 mmol/L (ref 22–32)
Chloride: 104 mmol/L (ref 101–111)
Creatinine, Ser: 0.89 mg/dL (ref 0.44–1.00)
GFR calc Af Amer: >60 mL/min (ref >60)
GFR calc non Af Amer: >60 mL/min (ref >60)
Glucose, Bld: 93 mg/dL (ref 65–99)
POTASSIUM: 4.2 mmol/L (ref 3.5–5.1)
Sodium: 138 mmol/L (ref 135–145)
TOTAL PROTEIN: 6.7 g/dL (ref 6.5–8.1)
Total Bilirubin: 0.3 mg/dL (ref 0.3–1.2)

## 2017-06-12 NOTE — Progress Notes (Signed)
D: Pt with sad affects observed crying alone in room. Pt endorsed moderate anxiety, depression and sadness; "my husband just came to visit; some of my friends here are also getting d/c tomorrow; I get easily attached people." Denied SI, HI or AVH. Pt remained restless.  A: Pt enlightened about the importance of interacting with peers and not isolating. Medications offered as prescribed. Pt given the opportunity to ask questions and state concerns. Support, encouragement, and safe environment provided. R: Pt verbally acknowledged the teachings. Pt was med compliant. All patient's questions and concerns addressed. 15-minute safety checks continue. Safety checks continue. Pt attended wrap-up group.

## 2017-06-12 NOTE — Progress Notes (Signed)
Patient attended group and said that her day was a 6.  Patient used sleeping, and socializing as her coping skills.

## 2017-06-12 NOTE — Plan of Care (Signed)
Problem: Education: Goal: Utilization of techniques to improve thought processes will improve Outcome: Progressing Nurse discussed anxiety/depression/coping skills with patient.    

## 2017-06-12 NOTE — [facility] Group Notes (Signed)
[facility] [name] Group Therapy Note  Date/Time:  06/12/2017  11:00AM-12:00PM  Type of Therapy and Topic:  Group Therapy:  Music and Mood  Participation Level:  Did Not Attend   Description of Group: In this process group, members listened to a variety of genres of music and identified that different types of music evoke different responses.  Patients were encouraged to identify music that was soothing for them and music that was energizing for them.  Patients discussed how this knowledge can help with wellness and recovery in various ways including managing depression and anxiety as well as encouraging healthy sleep habits.    Therapeutic Goals: 1. Patients will explore the impact of different varieties of music on mood 2. Patients will verbalize the thoughts they have when listening to different types of music 3. Patients will identify music that is soothing to them as well as music that is energizing to them 4. Patients will discuss how to use this knowledge to assist in maintaining wellness and recovery 5. Patients will explore the use of music as a coping skill  Summary of Patient Progress:  N/A  Therapeutic Modalities: Solution Focused Brief Therapy Motivational Interviewing Activity   Monteen Toops Grossman-Orr, [name] 06/12/2017 1:00 PM    

## 2017-06-12 NOTE — Progress Notes (Signed)
[facility] MD Progress Note  06/12/2017 1:40 PM Brittany [name]  MRN:  [id number]  Subjective: Brittany [name] reports, "I slept better last night, but it was not great. I just cannot stay asleep. Today, I'm kind of sad because Brittany [name] is being discharged. He has helped me a lot since I met him here. I'm going to miss him because I feel like he understood me. I'm trying to tough it out here, get fixed mentally so that I can go home & take care of my family".  Objective: Pt with Bipolar do, BPD, ADHD, Panic attacks, presented after she had a bad reaction to trintillex Per husband.  Brittany [name] is seen, chart reviewed. She is alert, verbally responsive. Her speech today remains coordinated & clear. She is visible on the unit today, participating in the group sessions. She reports today that she better last night, only that she can't maintain sleep. She remains on Xanax 2 mg tid prn. Her request to schedule her Xanax dosing time was declined yesterday. She denies any SIHI, AVH, delusional thoughts or paranoia.  She says the lower extremity swellings are improving. She rates her depression at #3 & anxiety #5. Brittany [name] presents with a reactive & improved affect, good eye contact noted as well. She does not appear to be responding to any internal stimuli or in any apparent distress. She says she kind of sad because another female patient is being discharged as he saw him as a good support system in this hospital. Brittany [name] is encouraged & reminded that any time she needs emotional support to reach out to the staff & not another patient. She is made aware that group sessions are meant for the patient's to learn/share coping skills from one another & not to rely on another patient for emotional support.   Principal Problem: Bipolar disorder, curr episode mixed, severe, with psychotic features ([place])  Diagnosis:   Patient Active Problem List   Diagnosis Date Noted  . Bipolar disorder, curr episode mixed, severe, with psychotic  features ([place]) [F31.64] 06/08/2017  . ADHD (attention deficit hyperactivity disorder) [F90.9] 09/15/2016  . Borderline personality disorder [F60.3] 05/04/2016  . Overdose [T50.901A] 05/03/2016  . Noncompliance [Z91.19] 05/03/2016   Total Time spent with patient: 15 minutes  Past Psychiatric History: Bipolar affective disorder.  Past Medical History:  Past Medical History:  Diagnosis Date  . ADHD (attention deficit hyperactivity disorder)   . Anxiety   . Arthritis    right shoulder, bilateral knees  . Bipolar disorder ([place])   . Bipolar I disorder, most recent episode depressed ([place])   . Chronic kidney disease    Kidney stones  . Depression   . Headache     Past Surgical History:  Procedure Laterality Date  . CESAREAN SECTION    . CHOLECYSTECTOMY  2017  . FOOT SURGERY    . KIDNEY SURGERY    . [place]  2010  . SHOULDER SURGERY Right   . TUBAL LIGATION     Family History:  Family History  Problem Relation Age of Onset  . Anxiety disorder Mother   . Depression Mother   . Drug abuse Mother   . Bipolar disorder Mother   . Depression Father   . Anxiety disorder Father   . Drug abuse Father   . Anxiety disorder Sister   . Depression Sister   . ADD / ADHD Sister   . Anxiety disorder Sister   . Depression Sister    Family Psychiatric  History: See H&P.  Social  History:  History  Alcohol Use  . 2.4 - 7.2 oz/week  . 4 Glasses of wine per week    Comment: "couple weeks"     History  Drug Use  . Types: Marijuana    Comment: last used about 3 weeks ago    Social History   Social History  . Marital status: Married    Spouse name: N/A  . Number of children: N/A  . Years of education: N/A   Social History Main Topics  . Smoking status: Former Smoker    Types: E-cigarettes  . Smokeless tobacco: Never Used  . Alcohol use 2.4 - 7.2 oz/week    4 Glasses of wine per week     Comment: "couple weeks"  . Drug use: Yes    Types: Marijuana     Comment:  last used about 3 weeks ago  . Sexual activity: Yes    Birth control/ protection: None   Other Topics Concern  . None   Social History Narrative  . None   Additional Social History:    Pain Medications: see PTA meds Prescriptions: see PTA meds Over the Counter: see PTA meds History of alcohol / drug use?: Yes Longest period of sobriety (when/how long): 3 weeks Name of Substance 1: thc 1 - Age of First Use: 18 1 - Amount (size/oz): vaires 1 - Frequency: once monthly 1 - Duration: on going  1 - Last Use / Amount: couple weeks ago Name of Substance 2: etoh 2 - Age of First Use: 19 yrs 2 - Frequency: twice monthly 2 - Duration: on going 2 - Last Use / Amount: 2 weeks ago  Sleep: "I slept better last night, but it was not great".  Appetite:  Fair  Current Medications: Current Facility-Administered Medications  Medication Dose Route Frequency Provider Last Rate Last Dose  . acetaminophen (TYLENOL) tablet 650 mg  650 mg Oral Q6H PRN Okonkwo, Justina A, NP      . ALPRAZolam Duanne Moron) tablet 2 mg  2 mg Oral TID PRN Ursula Alert, MD   2 mg at 06/12/17 0421  . alum & mag hydroxide-simeth (MAALOX/MYLANTA) 200-200-20 MG/5ML suspension 30 mL  30 mL Oral Q4H PRN Okonkwo, Justina A, NP      . doxepin (SINEQUAN) capsule 25 mg  25 mg Oral QHS Eappen, Saramma, MD   25 mg at 06/11/17 2101  . furosemide (LASIX) tablet 20 mg  20 mg Oral Daily Nwoko, Herbert Pun I, NP   20 mg at 06/12/17 0826  . hydrOXYzine (ATARAX/VISTARIL) tablet 50 mg  50 mg Oral Q6H PRN [name], PA-C   50 mg at 06/11/17 1150  . loratadine (CLARITIN) tablet 10 mg  10 mg Oral Daily Lindell Spar I, NP   10 mg at 06/12/17 0826  . magnesium hydroxide (MILK OF MAGNESIA) suspension 30 mL  30 mL Oral Daily PRN Okonkwo, Justina A, NP   30 mL at 06/12/17 0832  . nicotine (NICODERM CQ - dosed in mg/24 hours) patch 21 mg  21 mg Transdermal Daily Lindell Spar I, NP   21 mg at 06/12/17 0827  . OLANZapine zydis (ZYPREXA)  disintegrating tablet 5 mg  5 mg Oral TID PRN Ursula Alert, MD   5 mg at 06/12/17 0834  . ondansetron (ZOFRAN-ODT) disintegrating tablet 4 mg  4 mg Oral TID PRN Ursula Alert, MD   4 mg at 06/12/17 0833  . OXcarbazepine (TRILEPTAL) tablet 150 mg  150 mg Oral BID Ursula Alert, MD  150 mg at 06/12/17 0826  . perphenazine (TRILAFON) tablet 8 mg  8 mg Oral QHS Lindell Spar I, NP   8 mg at 06/11/17 2101   Lab Results:  Results for orders placed or performed during the hospital encounter of 06/08/17 (from the past 48 hour(s))  Comprehensive metabolic panel     Status: None   Collection Time: 06/12/17  6:13 AM  Result Value Ref Range   Sodium 138 135 - 145 mmol/L   Potassium 4.2 3.5 - 5.1 mmol/L   Chloride 104 101 - 111 mmol/L   CO2 27 22 - 32 mmol/L   Glucose, Bld 93 65 - 99 mg/dL   BUN 11 6 - 20 mg/dL   Creatinine, Ser 0.89 0.44 - 1.00 mg/dL   Calcium 9.1 8.9 - 10.3 mg/dL   Total Protein 6.7 6.5 - 8.1 g/dL   Albumin 3.8 3.5 - 5.0 g/dL   AST 24 15 - 41 U/L   ALT 36 14 - 54 U/L   Alkaline Phosphatase 58 38 - 126 U/L   Total Bilirubin 0.3 0.3 - 1.2 mg/dL   GFR calc non Af Amer >60 >60 mL/min   GFR calc Af Amer >60 >60 mL/min    Comment: (NOTE) The eGFR has been calculated using the CKD EPI equation. This calculation has not been validated in all clinical situations. eGFR's persistently <60 mL/min signify possible Chronic Kidney Disease.    Anion gap 7 5 - 15    Comment: Performed at [facility], [place] [address] [place], [place] [address]   Blood Alcohol level:  Lab Results  Component Value Date   ETH <5 77/93/9030   Metabolic Disorder Labs: Lab Results  Component Value Date   HGBA1C 5.0 06/10/2017   MPG 96.8 06/10/2017   MPG 105 09/16/2016   Lab Results  Component Value Date   PROLACTIN 57.7 (H) 06/10/2017   PROLACTIN 27.7 (H) 05/04/2016   Lab Results  Component Value Date   CHOL 189 06/10/2017   TRIG 175 (H) 06/10/2017   HDL 37 (L)  06/10/2017   CHOLHDL 5.1 06/10/2017   VLDL 35 06/10/2017   LDLCALC 117 (H) 06/10/2017   LDLCALC 176 (H) 09/16/2016   Physical Findings: AIMS: Facial and Oral Movements Muscles of Facial Expression: None, normal Lips and Perioral Area: None, normal Jaw: None, normal Tongue: None, normal,Extremity Movements Upper (arms, wrists, hands, fingers): None, normal Lower (legs, knees, ankles, toes): None, normal, Trunk Movements Neck, shoulders, hips: None, normal, Overall Severity Severity of abnormal movements (highest score from questions above): None, normal Incapacitation due to abnormal movements: None, normal Patient's awareness of abnormal movements (rate only patient's report): No Awareness, Dental Status Current problems with teeth and/or dentures?: No Does patient usually wear dentures?: No  CIWA:  CIWA-Ar Total: 2 COWS:  COWS Total Score: 1  Musculoskeletal: Strength & Muscle Tone: within normal limits Gait & Station: normal Patient leans: N/A  Psychiatric Specialty Exam: Physical Exam: Nurses notes & Vital signs recieved.  Review of Systems  Psychiatric/Behavioral: Positive for depression ("Improving", rates #3) and substance abuse (Cannabis use disorder, Benzodizepine use). Negative for hallucinations, memory loss and suicidal ideas. The patient is nervous/anxious. The patient does not have insomnia.     Blood pressure 125/80, pulse 86, temperature 98.6 F (37 C), temperature source Oral, resp. rate 20, height '5\' 4"'  (1.626 m), weight 103 kg (227 lb).Body mass index is 38.96 kg/m.  General Appearance: Casual  Eye Contact:  Fair  Speech:  Normal Rate  Volume: Normal  Mood: "Improving", rates #3  Affect: Reactive.  Thought Process:  Goal Directed and Descriptions of Associations: Circumstantial  Orientation:  Full (Time, Place, and Person)  Thought Content: Rumination, denies any hallucinations, delusions or paranoia.  Suicidal Thoughts: Currently denies any thoughts,  plans or intent.  Homicidal Thoughts: Denies.  Memory:  Immediate; Good Recent; Good Remote;   Fair  Judgement:  IImproved.  Insight:  Fair  Psychomotor Activity: Normal.  Concentration:  Concentration: Fair and Attention Span: Fair  Recall:  [employer] of Knowledge:  Fair  Language:  Fair  Akathisia:  No  Handed:  Right  AIMS (if indicated):     Assets:  Desire for Improvement Social Support  ADL's:  Intact  Cognition:  WNL  Sleep:  Number of Hours: 3.75.     Treatment Plan Summary: Daily contact with patient to assess and evaluate symptoms and progress in treatment:  Will continue today 06/12/17 plan as below except where it is noted.  -Continue Xanax 2 mg tid prn for anxiety. -Continue doxepin 25 mg Q hs for insomnia. -Continue Hydroxyzine 25 mg prn Q 6 hours for mild anxiety. -Continue Trileptal 150 mg bid for mood stabilization. -Continue perphenazine from 8 mg Q hs  For mood control. -Continue routine prns for anxiety, psychosis & pain  management. -Continue lasix 20 mg daily for lower extremity swellings. -Continue Claritin 10 mg daily for itching. -Reviewed CMP results, pending.  - Continue 15 minutes observation for safety concerns - Encouraged to participate in milieu therapy and group therapy counseling sessions and also work with coping skills -  Develop treatment plan to decrease risk of relapse upon discharge and to reduce the need for readmission. -  Psycho-social education regarding self care. - Health care follow up as needed for medical problems. - Restart home medications where appropriate.  Brittany Slates, NP, PMHNP, FNP-BC. 06/12/2017, 1:40 PMPatient ID: Brittany [name], female   DOB: 1986/05/28, 31 y.o.   MRN: [id number]

## 2017-06-12 NOTE — Progress Notes (Signed)
D:  Patient's self inventory sheet, patient has fair sleep, sleep medication helpful.  Fair appetite, low energy level, good concentration.  Rated depression and hopeless 4, anxiety 6.  Denied withdrawals.  Denied SI.  Denied physical problems.  Denied physical pain.  Goal is balancing out medications, so that I am a functioning part of society.  Plans to listen to MD and participate in group.  Xanax is one thing that helps with anxiety.  Really is the only thing that helps anxiety.  Does have discharge plans. A:  Medications administered per MD orders.  Emotional support and encouragement given patient. R:  Denied SI and HI while talking to nurse, contracts for safety.  Denied A/V hallucinations.  Safety maintained with 15 minute checks.

## 2017-06-13 MED ORDER — ALPRAZOLAM 1 MG PO TABS
2.0000 mg | ORAL_TABLET | Freq: Every evening | ORAL | Status: DC | PRN
  Administered 2017-06-13: 2 mg via ORAL
  Filled 2017-06-13: qty 2

## 2017-06-13 MED ORDER — OXCARBAZEPINE 300 MG PO TABS
300.0000 mg | ORAL_TABLET | Freq: Two times a day (BID) | ORAL | Status: DC
  Administered 2017-06-13 – 2017-06-14 (×2): 300 mg via ORAL
  Filled 2017-06-13 (×6): qty 1

## 2017-06-13 MED ORDER — ALPRAZOLAM 1 MG PO TABS
2.0000 mg | ORAL_TABLET | Freq: Two times a day (BID) | ORAL | Status: DC | PRN
  Administered 2017-06-13: 2 mg via ORAL
  Filled 2017-06-13 (×2): qty 2

## 2017-06-13 NOTE — Progress Notes (Signed)
[facility] MD Progress Note  06/13/2017 12:37 PM Jaislyn Blinn  MRN:  [id number] Subjective:  Patient states " I am afraid about what will happen if I get out. I am not afraid to die."  Objective:Patient seen and chart reviewed.Discussed patient with treatment team.  Pt today seen as anxious , tearful, labile. Pt also seen as vaguely irritable , preoccupied with getting her xanax back. Reports she takes it for sleep , has been taking since she were 18 y. Reports her provider has tried to take her off of it, but it made her decompensate and that is the only medication that helps. 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 Some time was spent reassuring patient, clarifying what Probation officer discussed and also what her thoughts and reactions were to it. Discussed how these kind of triggers can happen in the future and how to take a pause and practice coping with it. Discussed mindfulness.  Per RN - pt continues to have trouble with anxiety, peer to peer problems as well as has difficulty with staff. Discussed transferring patient to [address] if she does not feel safe here.     Principal Problem: Bipolar disorder, curr episode mixed, severe, with psychotic features ([place]) Diagnosis:   Patient Active Problem List   Diagnosis Date Noted  . Bipolar disorder, curr episode mixed, severe, with psychotic features ([place]) [F31.64] 06/08/2017  . ADHD (attention deficit hyperactivity disorder) [F90.9] 09/15/2016  . Borderline personality disorder [F60.3] 05/04/2016  . Overdose [T50.901A] 05/03/2016  . Noncompliance [Z91.19] 05/03/2016   Total Time spent with patient: 30 minutes  Past Psychiatric History: Please see H&P.   Past Medical History:  Past Medical History:  Diagnosis Date  . ADHD (attention deficit hyperactivity disorder)   . Anxiety   . Arthritis    right shoulder, bilateral knees  .  Bipolar disorder ([place])   . Bipolar I disorder, most recent episode depressed ([place])   . Chronic kidney disease    Kidney stones  . Depression   . Headache     Past Surgical History:  Procedure Laterality Date  . CESAREAN SECTION    . CHOLECYSTECTOMY  2017  . FOOT SURGERY    . KIDNEY SURGERY    . [place]  2010  . SHOULDER SURGERY Right   . TUBAL LIGATION     Family History:  Family History  Problem Relation Age of Onset  . Anxiety disorder Mother   . Depression Mother   . Drug abuse Mother   . Bipolar disorder Mother   . Depression Father   . Anxiety disorder Father   . Drug abuse Father   . Anxiety disorder Sister   . Depression Sister   . ADD / ADHD Sister   . Anxiety disorder Sister   . Depression Sister    Family Psychiatric  History: Please see H&P.  Social History: Please see H&P.  History  Alcohol Use  . 2.4 - 7.2 oz/week  . 4 Glasses of wine per week    Comment: "couple weeks"     History  Drug Use  . Types: Marijuana    Comment: last used about 3 weeks ago    Social History   Social History  . Marital status: Married    Spouse name: N/A  . Number of children: N/A  . Years of education: N/A   Social  History Main Topics  . Smoking status: Former Smoker    Types: E-cigarettes  . Smokeless tobacco: Never Used  . Alcohol use 2.4 - 7.2 oz/week    4 Glasses of wine per week     Comment: "couple weeks"  . Drug use: Yes    Types: Marijuana     Comment: last used about 3 weeks ago  . Sexual activity: Yes    Birth control/ protection: None   Other Topics Concern  . None   Social History Narrative  . None   Additional Social History:    Pain Medications: see PTA meds Prescriptions: see PTA meds Over the Counter: see PTA meds History of alcohol / drug use?: Yes Longest period of sobriety (when/how long): 3 weeks Name of Substance 1: thc 1 - Age of First Use: 18 1 - Amount (size/oz): vaires 1 - Frequency: once monthly 1 -  Duration: on going  1 - Last Use / Amount: couple weeks ago Name of Substance 2: etoh 2 - Age of First Use: 19 yrs 2 - Frequency: twice monthly 2 - Duration: on going 2 - Last Use / Amount: 2 weeks ago                Sleep: Poor  Appetite:  Fair  Current Medications: Current Facility-Administered Medications  Medication Dose Route Frequency Provider Last Rate Last Dose  . acetaminophen (TYLENOL) tablet 650 mg  650 mg Oral Q6H PRN Okonkwo, Justina A, NP   650 mg at 06/13/17 1210  . ALPRAZolam (XANAX) tablet 2 mg  2 mg Oral BID PRN Ursula Alert, MD      . ALPRAZolam Duanne Moron) tablet 2 mg  2 mg Oral QHS PRN Tona Qualley, MD      . alum & mag hydroxide-simeth (MAALOX/MYLANTA) 200-200-20 MG/5ML suspension 30 mL  30 mL Oral Q4H PRN Okonkwo, Justina A, NP      . doxepin (SINEQUAN) capsule 25 mg  25 mg Oral QHS Kambry Takacs, MD   25 mg at 06/12/17 2038  . furosemide (LASIX) tablet 20 mg  20 mg Oral Daily Lindell Spar I, NP   20 mg at 06/13/17 0844  . hydrOXYzine (ATARAX/VISTARIL) tablet 50 mg  50 mg Oral Q6H PRN Laverle Hobby, PA-C   50 mg at 06/13/17 0843  . loratadine (CLARITIN) tablet 10 mg  10 mg Oral Daily Lindell Spar I, NP   10 mg at 06/13/17 0844  . magnesium hydroxide (MILK OF MAGNESIA) suspension 30 mL  30 mL Oral Daily PRN Okonkwo, Justina A, NP   30 mL at 06/12/17 0832  . nicotine (NICODERM CQ - dosed in mg/24 hours) patch 21 mg  21 mg Transdermal Daily Lindell Spar I, NP   21 mg at 06/13/17 0845  . OLANZapine zydis (ZYPREXA) disintegrating tablet 5 mg  5 mg Oral TID PRN Ursula Alert, MD   5 mg at 06/13/17 0843  . ondansetron (ZOFRAN-ODT) disintegrating tablet 4 mg  4 mg Oral TID PRN Ursula Alert, MD   4 mg at 06/12/17 0833  . Oxcarbazepine (TRILEPTAL) tablet 300 mg  300 mg Oral BID Jayvon Mounger, MD      . perphenazine (TRILAFON) tablet 8 mg  8 mg Oral QHS Nwoko, Agnes I, NP   8 mg at 06/12/17 2039    Lab Results:  Results for orders placed or performed  during the hospital encounter of 06/08/17 (from the past 48 hour(s))  Comprehensive metabolic panel     Status:  None   Collection Time: 06/12/17  6:13 AM  Result Value Ref Range   Sodium 138 135 - 145 mmol/L   Potassium 4.2 3.5 - 5.1 mmol/L   Chloride 104 101 - 111 mmol/L   CO2 27 22 - 32 mmol/L   Glucose, Bld 93 65 - 99 mg/dL   BUN 11 6 - 20 mg/dL   Creatinine, Ser 0.89 0.44 - 1.00 mg/dL   Calcium 9.1 8.9 - 10.3 mg/dL   Total Protein 6.7 6.5 - 8.1 g/dL   Albumin 3.8 3.5 - 5.0 g/dL   AST 24 15 - 41 U/L   ALT 36 14 - 54 U/L   Alkaline Phosphatase 58 38 - 126 U/L   Total Bilirubin 0.3 0.3 - 1.2 mg/dL   GFR calc non Af Amer >60 >60 mL/min   GFR calc Af Amer >60 >60 mL/min    Comment: (NOTE) The eGFR has been calculated using the CKD EPI equation. This calculation has not been validated in all clinical situations. eGFR's persistently <60 mL/min signify possible Chronic Kidney Disease.    Anion gap 7 5 - 15    Comment: Performed at [facility], [place] [address] [place], [place] [address]    Blood Alcohol level:  Lab Results  Component Value Date   ETH <5 95/63/8756    Metabolic Disorder Labs: Lab Results  Component Value Date   HGBA1C 5.0 06/10/2017   MPG 96.8 06/10/2017   MPG 105 09/16/2016   Lab Results  Component Value Date   PROLACTIN 57.7 (H) 06/10/2017   PROLACTIN 27.7 (H) 05/04/2016   Lab Results  Component Value Date   CHOL 189 06/10/2017   TRIG 175 (H) 06/10/2017   HDL 37 (L) 06/10/2017   CHOLHDL 5.1 06/10/2017   VLDL 35 06/10/2017   LDLCALC 117 (H) 06/10/2017   LDLCALC 176 (H) 09/16/2016    Physical Findings: AIMS: Facial and Oral Movements Muscles of Facial Expression: None, normal Lips and Perioral Area: None, normal Jaw: None, normal Tongue: None, normal,Extremity Movements Upper (arms, wrists, hands, fingers): None, normal Lower (legs, knees, ankles, toes): None, normal, Trunk Movements Neck, shoulders, hips: None, normal,  Overall Severity Severity of abnormal movements (highest score from questions above): None, normal Incapacitation due to abnormal movements: None, normal Patient's awareness of abnormal movements (rate only patient's report): No Awareness, Dental Status Current problems with teeth and/or dentures?: No Does patient usually wear dentures?: No  CIWA:  CIWA-Ar Total: 1 COWS:  COWS Total Score: 2  Musculoskeletal: Strength & Muscle Tone: within normal limits Gait & Station: normal Patient leans: N/A  Psychiatric Specialty Exam: Physical Exam  Nursing note and vitals reviewed.   Review of Systems  Psychiatric/Behavioral: Positive for depression. The patient is nervous/anxious and has insomnia.   All other systems reviewed and are negative.   Blood pressure 105/67, pulse 96, temperature 97.9 F (36.6 C), resp. rate 18, height _0  (1.626 m), weight 103 kg (227 lb).Body mass index is 38.96 kg/m.  General Appearance: Casual  Eye Contact:  Fair  Speech:  Normal Rate  Volume:  Increased  Mood:  Angry, Anxious, Depressed, Dysphoric and Irritable  Affect:  Labile and Tearful  Thought Process:  Goal Directed and Descriptions of Associations: Circumstantial  Orientation:  Full (Time, Place, and Person)  Thought Content:  Paranoid Ideation and Rumination  Suicidal Thoughts:  is not afraid to die - did not report any plan  Homicidal Thoughts:  No  Memory:  Immediate;  Fair Recent;   Fair Remote;   Fair  Judgement:  Impaired  Insight:  Shallow  Psychomotor Activity:  Restlessness  Concentration:  Concentration: Fair and Attention Span: Fair  Recall:  [employer] of Knowledge:  Fair  Language:  Fair  Akathisia:  No  Handed:  Right  AIMS (if indicated):     Assets:  Desire for Improvement  ADL's:  Intact  Cognition:  WNL  Sleep:  Number of Hours: 6.5     Treatment Plan Summary:Patient with Bipolar do, Borderline personality disorder, BZD dependence, presented with worsening  suicidal thoughts , continues to be labile , 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 Discussed medication readjustment. Continue treatment. Daily contact with patient to assess and evaluate symptoms and progress in treatment, Medication management and Plan see below   Trilafon 8 mg po qhs for mood sx. Increase Trileptal to 300 mg po bid for augmenting Trilafon for mood lability. Change Xanax to 2 mg po bid prn for anxiety sx, xanax 2 mg po qhs prn for insomnia,anxiety. Reviewed [place] controlled substance database. Discussed risks of being on BZD , pt got upset - started TEFL teacher of picturizing her as a failure since she is on BZD. Doxepin 25 mg po qhs for insomnia. CSW will continue to work on disposition. Possible referral for IOP.    Bobbyjo Marulanda, MD 06/13/2017, 12:37 PM

## 2017-06-13 NOTE — Plan of Care (Signed)
Problem: Coping: Goal: Ability to cope will improve Outcome: Progressing Nurse discussed depression/anxiety/coping skills with patient.    

## 2017-06-13 NOTE — [facility] Group Notes (Signed)
[name] Group Therapy Note   06/13/2017 10:30am   Type of Therapy and Topic:  Group Therapy:  Overcoming Obstacles   Participation Level:  Minimal   Description of Group:    In this group patients will be encouraged to explore what they see as obstacles to their own wellness and recovery. They will be guided to discuss their thoughts, feelings, and behaviors related to these obstacles. The group will process together ways to cope with barriers, with attention given to specific choices patients can make. Each patient will be challenged to identify changes they are motivated to make in order to overcome their obstacles. This group will be process-oriented, with patients participating in exploration of their own experiences as well as giving and receiving support and challenge from other group members.   Therapeutic Goals: 1. Patient will identify personal and current obstacles as they relate to admission. 2. Patient will identify barriers that currently interfere with their wellness or overcoming obstacles.  3. Patient will identify feelings, thought process and behaviors related to these barriers. 4. Patient will identify two changes they are willing to make to overcome these obstacles:      Summary of Patient Progress Pt was only present briefly in the group before being called out to meet with MD; Pt did not participate in discussion when present.      Therapeutic Modalities:   Cognitive Behavioral Therapy Solution Focused Therapy Motivational Interviewing Relapse Prevention Therapy  Verdene LennertLauren C Keyonda Bickle, [name] 06/13/2017 9:53 AM

## 2017-06-13 NOTE — Progress Notes (Signed)
D: Pt observed in room interacting with roommate. Pt endorsed severe anxiety and worrying; "I don't know why I feel like this; my main problem today has been anxiety. I am very anxious right now." Denied SI, HI or AVH. Pt remained restless.  A: Medications offered as prescribed. Pt given the opportunity to ask questions and state concerns. Support, encouragement, and safe environment provided. R: Pt was med compliant. All patient's questions and concerns addressed. 15-minute safety checks continue. Safety checks continue. Pt attended wrap-up group.

## 2017-06-13 NOTE — Progress Notes (Signed)
Recreation Therapy Notes  Date: 06/13/17 Time: 1000 Location: 500 Hall Dayroom  Group Topic: Coping Skills  Goal Area(s) Addresses:  Patient will be able to identify positive coping skills. Patient will be able to identify benefits of using positive coping skills post d/c.  Intervention: Financial traderWeb worksheet, pencils  Activity: OrthoptistWeb Design.  Patients were given a blank Orthoptistweb design.  Patients were to imagine themselves being stuck inside the spider web.  Patients were to then label the things that have kept them "stuck" within the web.  Once patient identified those things, they were to come up with at least three coping skills for the situations they identified.  Education: PharmacologistCoping Skills, Building control surveyorDischarge Planning.   Education Outcome: Acknowledges understanding/In group clarification offered/Needs additional education.   Clinical Observations/Feedback: Pt did not attend group.    Caroll RancherMarjette Zionah Criswell, LRT/CTRS       Caroll RancherLindsay, Jadier Rockers A 06/13/2017 12:05 PM

## 2017-06-13 NOTE — Progress Notes (Signed)
Patient upset this morning about her medications, etc.  MDs informed, AC, charge nurse, SWs and staff informed of patient's statements.  Patient had called husband complaining this morning.  Husband called and left message with staff that he would pick up patient if she is discharged today.  Patient presently sleeping in bed.  Respirations even and unlabored.

## 2017-06-13 NOTE — Progress Notes (Addendum)
D:  Patient denied SI and HI this morning while talking to nurse, contracts for safety.  Denied A/V hallucinations.  Patient was upset this morning.  AC, charge nurse, and nurses talked to patient.  Patient moved to 400 hall per MD instructions.  Patient went to lunch in dining room, returned to 400 hall room and has been sleeping this afternoon. A:  Medications administered per MD orders.  Emotional support and encouragement given patient. R:  Patient denied SI and HI, contracts for safety.  Denied A/V hallucinations.  Safety maintained with 15 minute checks.

## 2017-06-13 NOTE — Progress Notes (Signed)
[facility] was called and Brittany [name] (pharmacist) stated it was appropriate to give xanax 2 mg po prn at 1639 and that patient can take xanax night time dose tonight.  Medications were explained to patient and she stated she understood.  Charge nurse informed.

## 2017-06-14 MED ORDER — LORATADINE 10 MG PO TABS: 10.0000 mg | ORAL_TABLET | Freq: Every day | ORAL | Status: DC

## 2017-06-14 MED ORDER — AMPHETAMINE-DEXTROAMPHETAMINE 30 MG PO TABS: 30.0000 mg | ORAL_TABLET | ORAL | 0 refills | Status: DC

## 2017-06-14 MED ORDER — HYDROXYZINE HCL 25 MG PO TABS: 25.0000 mg | ORAL_TABLET | Freq: Three times a day (TID) | ORAL | 0 refills | Status: DC | PRN

## 2017-06-14 MED ORDER — FUROSEMIDE 20 MG PO TABS: 20.0000 mg | ORAL_TABLET | Freq: Every day | ORAL | 0 refills | Status: DC

## 2017-06-14 MED ORDER — ONDANSETRON 8 MG PO TBDP: 8.0000 mg | ORAL_TABLET | Freq: Two times a day (BID) | ORAL | 0 refills | Status: DC | PRN

## 2017-06-14 MED ORDER — ALPRAZOLAM 2 MG PO TABS: 2.0000 mg | ORAL_TABLET | Freq: Three times a day (TID) | ORAL | 0 refills | Status: DC

## 2017-06-14 MED ORDER — DOXEPIN HCL 25 MG PO CAPS: 25.0000 mg | ORAL_CAPSULE | Freq: Every day | ORAL | 0 refills | Status: DC

## 2017-06-14 MED ORDER — OXCARBAZEPINE 300 MG PO TABS: 300.0000 mg | ORAL_TABLET | Freq: Two times a day (BID) | ORAL | 0 refills | Status: DC

## 2017-06-14 MED ORDER — PERPHENAZINE 8 MG PO TABS: 8.0000 mg | ORAL_TABLET | Freq: Every day | ORAL | 0 refills | Status: DC

## 2017-06-14 NOTE — Progress Notes (Signed)
D: Pt denies SI/HI/AVH. Pt is pleasant and cooperative. Pt felt like she was being targeted with her move and pt felt she was being singled out by staff. Pt was encouraged to understand that different things can be perceived many ways but that does not mean that is the way it is. Pt appeared to calm down after talking and seemed to be more relaxed, pt was given coping skills and encouraged to start working on things to help reduce the usage of medications to decrease her anxiety.   A: Pt was offered support and encouragement. Pt was given scheduled medications. Pt was encourage to attend groups. Q 15 minute checks were done for safety.   R:Pt attends groups and interacts well with peers and staff. Pt is taking medication. Pt receptive to treatment and safety maintained on unit.

## 2017-06-14 NOTE — Progress Notes (Signed)
Patient ID: Brittany [name], female   DOB: 11/25/1985, 31 y.o.   MRN: [id number] Pt d/c to home with husband. D/c instructions, transition paperwork,  rx's, suicide prevention information given and reviewed. Pt verbalizes understanding. Pt denies s.i.

## 2017-06-14 NOTE — Discharge Summary (Signed)
Physician Discharge Summary Note  Patient:  Brittany [name] is an 31 y.o., female MRN:  [id number] DOB:  1986-01-03 Patient phone:  [phone] (home)  Patient address:   [address] [place] [address],  Total Time spent with patient: Greater than 30 minutes  Date of Admission:  06/08/2017 Date of Discharge: 06/14/2017  Reason for Admission: Worsening symptoms Bipolar disorder, mixed episodes.  Principal Problem: Bipolar disorder, curr episode mixed, severe, with psychotic features [facility])  Discharge Diagnoses: Patient Active Problem List   Diagnosis Date Noted  . Bipolar disorder, curr episode mixed, severe, with psychotic features (HCC) [F31.64] 06/08/2017  . ADHD (attention deficit hyperactivity disorder) [F90.9] 09/15/2016  . Borderline personality disorder [F60.3] 05/04/2016  . Overdose [T50.901A] 05/03/2016  . Noncompliance [Z91.19] 05/03/2016   Past Medical History:  Past Medical History:  Diagnosis Date  . ADHD (attention deficit hyperactivity disorder)   . Anxiety   . Arthritis    right shoulder, bilateral knees  . Bipolar disorder (HCC)   . Bipolar I disorder, most recent episode depressed (HCC)   . Chronic kidney disease    Kidney stones  . Depression   . Headache     Past Surgical History:  Procedure Laterality Date  . CESAREAN SECTION    . CHOLECYSTECTOMY  2017  . FOOT SURGERY    . KIDNEY SURGERY    . NOVASURE ABLATION  2010  . SHOULDER SURGERY Right   . TUBAL LIGATION     Family History:  Family History  Problem Relation Age of Onset  . Anxiety disorder Mother   . Depression Mother   . Drug abuse Mother   . Bipolar disorder Mother   . Depression Father   . Anxiety disorder Father   . Drug abuse Father   . Anxiety disorder Sister   . Depression Sister   . ADD / ADHD Sister   . Anxiety disorder Sister   . Depression Sister    Social History:  History  Alcohol Use  . 2.4 - 7.2 oz/week  . 4 Glasses of wine per week    Comment: "couple  weeks"     History  Drug Use  . Types: Marijuana    Comment: last used about 3 weeks ago    Social History   Social History  . Marital status: Married    Spouse name: N/A  . Number of children: N/A  . Years of education: N/A   Social History Main Topics  . Smoking status: Former Smoker    Types: E-cigarettes  . Smokeless tobacco: Never Used  . Alcohol use 2.4 - 7.2 oz/week    4 Glasses of wine per week     Comment: "couple weeks"  . Drug use: Yes    Types: Marijuana     Comment: last used about 3 weeks ago  . Sexual activity: Yes    Birth control/ protection: None   Other Topics Concern  . None   Social History Narrative  . None   Hospital Course: (Per admission assessment): This is an admission assessment for this 32 year old Caucasian female with hx of chronic mental illness. Brittany [name] has a long history of depression since the age of 85. For the past several years, she has been hospitalized over 17 times in a psychiatric ward. She reports variety of symptoms of depression with poor sleep, on average she gets 2 hours of sleep at night, this is with taking up to 1200 mg of Trazodone in one night alone.  She has hx of suicide attempts impulsively. In addition to depressive symptoms, she reports symptoms of extreme anxiety & panic attacks. She reports having nightmares & flashbacks from her intense childhood trauma.  During her admission interview with the psychiatrist & the NP; [name] reports, I have Bipolar do, Borderline Personality disorder , ADHD, Panic disorder & depression. She presented after she had a bad reaction to Trintillex Per husband's report. Brittany [name] was doing well on Lexapro prior to the Trintellex. She had gotten back to school - [employer] & started a course on substance abuse counselling. She was apparently having sexual side effects to Lexapro & it was causing some problems in her marriage. Brittany [name] reports that her husband accused her of  having an affair since she would not have sex with him. She blamed it on the effects of Lexapro. Then, she went to her PCP who started her onTrintillex & discontinued the Lexapro. But, she had a bad reaction to the Trintillex. Per husband's reports,  Brittany [name] became irritable , angry, impulsive, suicidal, sad & presented with mixed symptoms. Per husband's reports,  she has been on Adderall for years, that helps her symptoms of ADHD & she does not do well without it. Brittany [name] apparently had tried Trintelix in the past & have had same reaction to it. She came [facility] the [facility] seeking further help with mood stabilization.  After the above admission assessment, Brittany [name] was started on the medication regimen for her presenting symptoms. She received & was discharged on; Xanax 2 mg for severe anxiety/panic attacks, Adderall 30 mg for ADHD, Doxepin 25 mg Q hs for insomnia, Hydroxyzine 25 mg prn for anxiety, Trileptal 300 mg for mood stabilization & Perphenazine 8 mg Q hs for mood control.  She was enrolled & participated in the group counseling sessions being offered & held on this unit. She learned coping skills. She also received/discharged on other medication regimen for the other medical issues presented. She tolerated her treatment regimen without any adverse effects or reactions reported.   Brittany [name] is seen today by the attending psychiatrist for discharge. She says she has normal anxiety about going home. She is not overwhelmed by this. She is looking forward to working on her mental health issues. Not expressing any delusions today. No hallucinations. Feels in control of herself. No passivity of thought. No passivity of will. No fantasy about suicide lately. No suicidal thoughts. Looking forward to getting back to school & life. No thoughts of violence. No craving for substances. Does not feel depressed. No evidence of mania.  The nursing staff reports that patient has been appropriate on the unit. Patient has  been interacting well with peers. No behavioral issues. Patient has not voiced any suicidal thoughts. Patient has not been observed to be internally stimulated or preoccupied. Patient has been adherent with treatment recommendations. Patient has been tolerating her medications well. No reported adverse effects or reactions.   Patient was discussed at the treatment team meeting this morning. The team members feel that patient is back to her baseline level of function. Team agrees with plan to discharge patient today to continue mental health health care on an outpatient basis as noted below. She left [facility] with all personal belongings in no apparent distress. Transportation per husband.  Physical Findings: AIMS: Facial and Oral Movements Muscles of Facial Expression: None, normal Lips and Perioral Area: None, normal Jaw: None, normal Tongue: None, normal,Extremity Movements Upper (arms, wrists, hands, fingers): None, normal Lower (legs, knees, ankles, toes): None,  normal, Trunk Movements Neck, shoulders, hips: None, normal, Overall Severity Severity of abnormal movements (highest score from questions above): None, normal Incapacitation due to abnormal movements: None, normal Patient's awareness of abnormal movements (rate only patient's report): No Awareness, Dental Status Current problems with teeth and/or dentures?: No Does patient usually wear dentures?: No  CIWA:  CIWA-Ar Total: 1 COWS:  COWS Total Score: 2  Musculoskeletal: Strength & Muscle Tone: within normal limits Gait & Station: normal Patient leans: N/A  Psychiatric Specialty Exam: Physical Exam  Nursing note and vitals reviewed. Constitutional: She appears well-developed.  HENT:  Head: Normocephalic.  Eyes: Pupils are equal, round, and reactive to light.  Neck: Normal range of motion.  Cardiovascular: Normal rate.   Respiratory: Effort normal.  GI: Soft.  Genitourinary:  Genitourinary Comments: Deferred   Musculoskeletal: Normal range of motion.  Neurological: She is alert.  Skin: Skin is warm.    Review of Systems  Constitutional: Negative.   HENT: Negative.   Eyes: Negative.   Respiratory: Negative.   Cardiovascular: Negative.   Gastrointestinal: Negative.   Genitourinary: Negative.   Skin: Negative.   Neurological: Negative.   Endo/Heme/Allergies: Negative.   Psychiatric/Behavioral: Positive for depression (Stable), hallucinations (Hx. Psychosis.) and substance abuse (Hx. Benzodiazepine use (presecribed), Hx. Cannabis use disorder.). Negative for memory loss and suicidal ideas. The patient is nervous/anxious and has insomnia.   All other systems reviewed and are negative.   Blood pressure 107/90, pulse 96, temperature 98 F (36.7 C), temperature source Oral, resp. rate 18, height 5\' 4"  (1.626 m), weight 103 kg (227 lb).Body mass index is 38.96 kg/m.  See Md's SRA   Have you used any form of tobacco in the last 30 days? (Cigarettes, Smokeless Tobacco, Cigars, and/or Pipes): No  Has this patient used any form of tobacco in the last 30 days? (Cigarettes, Smokeless Tobacco, Cigars, and/or Pipes): Yes, A prescription for an FDA-approved tobacco cessation medication was offered at discharge and the patient refused  Blood Alcohol level:  Lab Results  Component Value Date   ETH <5 05/03/2016    Metabolic Disorder Labs:  Lab Results  Component Value Date   HGBA1C 5.0 06/10/2017   MPG 96.8 06/10/2017   MPG 105 09/16/2016   Lab Results  Component Value Date   PROLACTIN 57.7 (H) 06/10/2017   PROLACTIN 27.7 (H) 05/04/2016   Lab Results  Component Value Date   CHOL 189 06/10/2017   TRIG 175 (H) 06/10/2017   HDL 37 (L) 06/10/2017   CHOLHDL 5.1 06/10/2017   VLDL 35 06/10/2017   LDLCALC 117 (H) 06/10/2017   LDLCALC 176 (H) 09/16/2016   See Psychiatric Specialty Exam and Suicide Risk Assessment completed by Attending Physician prior to discharge.  Discharge destination:   Home  Is patient on multiple antipsychotic therapies at discharge:  No   Has Patient had three or more failed trials of antipsychotic monotherapy by history:  No  Recommended Plan for Multiple Antipsychotic Therapies: NA  Allergies as of 06/14/2017      Reactions   Ceclor [cefaclor] Anaphylaxis, Hives, Nausea And Vomiting   [place] Wilfred [place]) Anaphylaxis   Adhesive [tape] Other (See Comments)   Red, blisters      Medication List    STOP taking these medications   BIOTIN PO   busPIRone 15 MG tablet Commonly known as:  BUSPAR   loperamide 2 MG capsule Commonly known as:  IMODIUM   omeprazole 20 MG capsule Commonly known as:  PRILOSEC   tiZANidine 4  MG tablet Commonly known as:  ZANAFLEX   topiramate 100 MG tablet Commonly known as:  TOPAMAX   traZODone 150 MG tablet Commonly known as:  DESYREL     TAKE these medications     Indication  alprazolam 2 MG tablet Commonly known as:  XANAX Take 1 tablet (2 mg total) by mouth 3 (three) times daily. For severe anxiety What changed:  additional instructions  Indication:  Feeling Anxious, Panic Disorder   amphetamine-dextroamphetamine 30 MG tablet Commonly known as:  ADDERALL Take 1 tablet by mouth every morning. For ADHD What changed:  additional instructions  Another medication with the same name was removed. Continue taking this medication, and follow the directions you see here.  Indication:  Attention Deficit Hyperactivity Disorder   doxepin 25 MG capsule Commonly known as:  SINEQUAN Take 1 capsule (25 mg total) by mouth at bedtime. For sleep  Indication:  Depression, Sleep   furosemide 20 MG tablet Commonly known as:  LASIX Take 1 tablet (20 mg total) by mouth daily. For swellings What changed:  additional instructions  Indication:  Edema   hydrOXYzine 25 MG tablet Commonly known as:  ATARAX/VISTARIL Take 1 tablet (25 mg total) by mouth every 8 (eight) hours as needed for anxiety.  Indication:   Feeling Anxious   loratadine 10 MG tablet Commonly known as:  CLARITIN Take 1 tablet (10 mg total) by mouth daily. (May purchase from over the counter): For allergies, itching  Indication:  Perennial Allergic Rhinitis, Hayfever, Itching   ondansetron 8 MG disintegrating tablet Commonly known as:  ZOFRAN-ODT Take 1 tablet (8 mg total) by mouth 2 (two) times daily as needed for nausea or vomiting.  Indication:  Nausea, vomiting   Oxcarbazepine 300 MG tablet Commonly known as:  TRILEPTAL Take 1 tablet (300 mg total) by mouth 2 (two) times daily. For mood stabilization  Indication:  Mood stabilization   perphenazine 8 MG tablet Commonly known as:  TRILAFON Take 1 tablet (8 mg total) by mouth at bedtime. For mood control  Indication:  Mood control      Follow-up Information    [facility] Follow up on 06/20/2017.   Specialty:  Family Medicine Why:  Monday, June 20, 2017 at 2:30pm for your hospital follow up appointment. Following this appointment, you have the option of being seen in [place] at [address] [place], [place] [address]  Contact information: [address] [place] [address] [phone]          Follow-up recommendations: Activity:  As tolerated Diet: As recommended by your primary care doctor. Keep all scheduled follow-up appointments as recommended.    Comments: Patient is instructed prior to discharge to: Take all medications as prescribed by his/her mental healthcare provider. Report any adverse effects and or reactions from the medicines to his/her outpatient provider promptly. Patient has been instructed & cautioned: To not engage in alcohol and or illegal drug use while on prescription medicines. In the event of worsening symptoms, patient is instructed to call the crisis hotline, 911 and or go to the nearest ED for appropriate evaluation and treatment of symptoms. To follow-up with his/her primary care  provider for your other medical issues, concerns and or health care needs.    Signed: Sanjuana KavaNwoko, Sabrina Arriaga I, NP, PMHNP, FNP-BC 06/14/2017, 1:16 PM

## 2017-06-14 NOTE — [facility] Suicide Risk Assessment (Signed)
[facility] Discharge Suicide Risk Assessment   Principal Problem: Bipolar disorder, curr episode mixed, severe, with psychotic features [facility]) Discharge Diagnoses:  Patient Active Problem List   Diagnosis Date Noted  . Bipolar disorder, curr episode mixed, severe, with psychotic features (HCC) [F31.64] 06/08/2017  . ADHD (attention deficit hyperactivity disorder) [F90.9] 09/15/2016  . Borderline personality disorder [F60.3] 05/04/2016  . Overdose [T50.901A] 05/03/2016  . Noncompliance [Z91.19] 05/03/2016    Total Time spent with patient: 30 minutes  Musculoskeletal: Strength & Muscle Tone: within normal limits Gait & Station: normal Patient leans: N/A  Psychiatric Specialty Exam: Review of Systems  Psychiatric/Behavioral: Negative for depression, hallucinations and suicidal ideas. The patient does not have insomnia.   All other systems reviewed and are negative.   Blood pressure 107/90, pulse 96, temperature 98 F (36.7 C), temperature source Oral, resp. rate 18, height 5\' 4"  (1.626 m), weight 103 kg (227 lb).Body mass index is 38.96 kg/m.  General Appearance: Casual  Eye Contact::  Fair  Speech:  Clear and Coherent409  Volume:  Normal  Mood:  Euthymic  Affect:  Congruent  Thought Process:  Goal Directed and Descriptions of Associations: Intact  Orientation:  Full (Time, Place, and Person)  Thought Content:  Logical  Suicidal Thoughts:  No  Homicidal Thoughts:  No  Memory:  Immediate;   Fair Recent;   Fair Remote;   Fair  Judgement:  Fair  Insight:  Fair  Psychomotor Activity:  Normal  Concentration:  Fair  Recall:  [employer] of Knowledge:Fair  Language: Fair  Akathisia:  No  Handed:  Right  AIMS (if indicated):     Assets:  Communication Skills Desire for Improvement  Sleep:  Number of Hours: 6.75  Cognition: WNL  ADL's:  Intact   Mental Status Per Nursing Assessment::   On Admission:  NA, Self-harm thoughts, Suicidal ideation indicated by others  Demographic  Factors:  Caucasian  Loss Factors: NA  Historical Factors: Impulsivity  Risk Reduction Factors:   Living with another person, especially a relative, Positive social support, Positive therapeutic relationship and Positive coping skills or problem solving skills  Continued Clinical Symptoms:  Previous Psychiatric Diagnoses and Treatments  Cognitive Features That Contribute To Risk:  None    Suicide Risk:  Minimal: No identifiable suicidal ideation.  Patients presenting with no risk factors but with morbid ruminations; may be classified as minimal risk based on the severity of the depressive symptoms  Follow-up Information    [facility] Follow up on 06/20/2017.   Specialty:  Family Medicine Why:  Monday, June 20, 2017 at 2:30pm for your hospital follow up appointment. Following this appointment, you have the option of being seen in [place] at [address] [place], [place] [address]  Contact information: [address] [place] [address] [phone]           Plan Of Care/Follow-up recommendations: Patient reports she is able to cope better with her anxiety provoking thoughts. Pt denies any SI/HI/AH/VH today. She denies access to guns. Reports sleep as improved. She is aware of the risks of being on BZD .  She is tolerating her medications well. She reports she will continue therapy and reach out for help if she feels she is in a crisis. She apologized for her behavior yesterday . She is motivated to start school and requests a letter for school. Activity:  no restrictions Diet:  regular Tests:  as needed Other:  follow up  with aftercare  Larina Lieurance, MD 06/14/2017, 9:28 AM

## 2017-06-14 NOTE — Tx Team (Signed)
Interdisciplinary Treatment and Diagnostic Plan Update  06/14/2017 Time of Session: 8:15 AM  Shamarie Call MRN: [id number]  Principal Diagnosis: Bipolar disorder, curr episode mixed, severe, with psychotic features ([place])  Secondary Diagnoses: Principal Problem:   Bipolar disorder, curr episode mixed, severe, with psychotic features ([place]) Active Problems:   Borderline personality disorder   ADHD (attention deficit hyperactivity disorder)   Current Medications:  Current Facility-Administered Medications  Medication Dose Route Frequency Provider Last Rate Last Dose  . acetaminophen (TYLENOL) tablet 650 mg  650 mg Oral Q6H PRN Okonkwo, Justina A, NP   650 mg at 06/13/17 1210  . ALPRAZolam Duanne Moron) tablet 2 mg  2 mg Oral BID PRN Ursula Alert, MD   2 mg at 06/13/17 1639  . ALPRAZolam Duanne Moron) tablet 2 mg  2 mg Oral QHS PRN Ursula Alert, MD   2 mg at 06/13/17 2233  . alum & mag hydroxide-simeth (MAALOX/MYLANTA) 200-200-20 MG/5ML suspension 30 mL  30 mL Oral Q4H PRN Okonkwo, Justina A, NP      . doxepin (SINEQUAN) capsule 25 mg  25 mg Oral QHS Eappen, Saramma, MD   25 mg at 06/13/17 2233  . furosemide (LASIX) tablet 20 mg  20 mg Oral Daily Lindell Spar I, NP   20 mg at 06/13/17 0844  . hydrOXYzine (ATARAX/VISTARIL) tablet 50 mg  50 mg Oral Q6H PRN Laverle Hobby, PA-C   50 mg at 06/13/17 0843  . loratadine (CLARITIN) tablet 10 mg  10 mg Oral Daily Lindell Spar I, NP   10 mg at 06/13/17 0844  . magnesium hydroxide (MILK OF MAGNESIA) suspension 30 mL  30 mL Oral Daily PRN Okonkwo, Justina A, NP   30 mL at 06/12/17 0832  . nicotine (NICODERM CQ - dosed in mg/24 hours) patch 21 mg  21 mg Transdermal Daily Lindell Spar I, NP   21 mg at 06/13/17 0845  . OLANZapine zydis (ZYPREXA) disintegrating tablet 5 mg  5 mg Oral TID PRN Ursula Alert, MD   5 mg at 06/13/17 0843  . ondansetron (ZOFRAN-ODT) disintegrating tablet 4 mg  4 mg Oral TID PRN Ursula Alert, MD   4 mg at 06/13/17 2233  .  Oxcarbazepine (TRILEPTAL) tablet 300 mg  300 mg Oral BID Ursula Alert, MD   300 mg at 06/13/17 1639  . perphenazine (TRILAFON) tablet 8 mg  8 mg Oral QHS Lindell Spar I, NP   8 mg at 06/13/17 2233    PTA Medications: Prescriptions Prior to Admission  Medication Sig Dispense Refill Last Dose  . alprazolam (XANAX) 2 MG tablet Take 2 mg by mouth 3 (three) times daily.   06/07/2017  . amphetamine-dextroamphetamine (ADDERALL) 15 MG tablet Take 15 mg by mouth daily with lunch.    06/07/2017  . amphetamine-dextroamphetamine (ADDERALL) 30 MG tablet Take 30 mg by mouth every morning.    06/07/2017  . BIOTIN PO Take 1 tablet by mouth daily.   06/07/2017  . busPIRone (BUSPAR) 15 MG tablet Take 15 mg by mouth 2 (two) times daily.   06/06/2017  . furosemide (LASIX) 20 MG tablet Take 20 mg by mouth daily.   06/07/2017  . hydrOXYzine (ATARAX/VISTARIL) 25 MG tablet Take 25 mg by mouth every 8 (eight) hours as needed for anxiety.   06/07/2017  . loperamide (IMODIUM) 2 MG capsule Take 2 capsules (4 mg total) by mouth daily with breakfast. 30 capsule 1 06/06/2017  . omeprazole (PRILOSEC) 20 MG capsule Take 20 mg by mouth daily as needed (For  heartburn or acid reflux.).   06/06/2017  . ondansetron (ZOFRAN-ODT) 8 MG disintegrating tablet Take 8 mg by mouth 2 (two) times daily as needed for nausea or vomiting.   06/08/2017  . tiZANidine (ZANAFLEX) 4 MG tablet Take 4-8 mg by mouth 3 (three) times daily as needed for muscle spasms.    06/06/2017  . topiramate (TOPAMAX) 100 MG tablet Take 100 mg by mouth at bedtime as needed (For headaches).   Jan 2018  . traZODone (DESYREL) 150 MG tablet Take 450-750 mg by mouth at bedtime.   06/06/2017    Patient Stressors: Financial difficulties Loss of best friend March 2018 Marital or family conflict Medication change or noncompliance  Patient Strengths: Ability for insight Active sense of humor Average or above average intelligence Capable of independent living Horticulturist, commercial fund of knowledge Motivation for treatment/growth Physical Health Supportive family/friends Work skills  Treatment Modalities: Medication Management, Group therapy, Case management,  1 to 1 session with clinician, Psychoeducation, Recreational therapy.   Physician Treatment Plan for Primary Diagnosis: Bipolar disorder, curr episode mixed, severe, with psychotic features ([place]) Long Term Goal(s): Improvement in symptoms so as ready for discharge  Short Term Goals: Ability to identify changes in lifestyle to reduce recurrence of condition will improve Ability to verbalize feelings will improve Ability to disclose and discuss suicidal ideas Ability to identify and develop effective coping behaviors will improve Compliance with prescribed medications will improve  Medication Management: Evaluate patient's response, side effects, and tolerance of medication regimen.  Therapeutic Interventions: 1 to 1 sessions, Unit Group sessions and Medication administration.  Evaluation of Outcomes: Progressing  Physician Treatment Plan for Secondary Diagnosis: Principal Problem:   Bipolar disorder, curr episode mixed, severe, with psychotic features ([place]) Active Problems:   Borderline personality disorder   ADHD (attention deficit hyperactivity disorder)   Long Term Goal(s): Improvement in symptoms so as ready for discharge  Short Term Goals: Ability to identify changes in lifestyle to reduce recurrence of condition will improve Ability to verbalize feelings will improve Ability to disclose and discuss suicidal ideas Ability to identify and develop effective coping behaviors will improve Compliance with prescribed medications will improve  Medication Management: Evaluate patient's response, side effects, and tolerance of medication regimen.  Therapeutic Interventions: 1 to 1 sessions, Unit Group sessions and Medication administration.  Evaluation of Outcomes:  Progressing   RN Treatment Plan for Primary Diagnosis: Bipolar disorder, curr episode mixed, severe, with psychotic features ([place]) Long Term Goal(s): Knowledge of disease and therapeutic regimen to maintain health will improve  Short Term Goals: Ability to disclose and discuss suicidal ideas and Ability to identify and develop effective coping behaviors will improve  Medication Management: RN will administer medications as ordered by provider, will assess and evaluate patient's response and provide education to patient for prescribed medication. RN will report any adverse and/or side effects to prescribing provider.  Therapeutic Interventions: 1 on 1 counseling sessions, Psychoeducation, Medication administration, Evaluate responses to treatment, Monitor vital signs and CBGs as ordered, Perform/monitor CIWA, COWS, AIMS and Fall Risk screenings as ordered, Perform wound care treatments as ordered.  Evaluation of Outcomes: Progressing   [name] Treatment Plan for Primary Diagnosis: Bipolar disorder, curr episode mixed, severe, with psychotic features ([place]) Long Term Goal(s): Safe transition to appropriate next level of care at discharge, Engage patient in therapeutic group addressing interpersonal concerns.  Short Term Goals: Engage patient in aftercare planning with referrals and resources  Therapeutic Interventions: Assess for all discharge needs, 1  to 1 time with Education officer, museum, Explore available resources and support systems, Assess for adequacy in community support network, Educate family and significant other(s) on suicide prevention, Complete Psychosocial Assessment, Interpersonal group therapy.  Evaluation of Outcomes: Met  Return home, follow up outpt   Progress in Treatment: Attending groups: Yes Participating in groups: Yes Taking medication as prescribed: Yes Toleration medication: Yes, no side effects reported at this time Family/Significant other contact made: Yes with  husband Patient understands diagnosis: Yes AEB Discussing patient identified problems/goals with staff: Yes Medical problems stabilized or resolved: Yes Denies suicidal/homicidal ideation: Yes Issues/concerns per patient self-inventory: None Other: N/A  New problem(s) identified: None identified at this time.   New Short Term/Long Term Goal(s): "I need help with anxiety which leads to panic, depression and sleep, but I don't think you can help me because none of the previous 16 hospitalizations helped. But you can help me feel safe here."  Discharge Plan or Barriers: Pt will return home and follow-up with outpatient services. CSW assessing for appropriate referrals  Reason for Continuation of Hospitalization: Anxiety Depression Medication stabilization  Estimated Length of Stay: 0-2 days; est DC date 9/5  Attendees: Patient:  06/14/2017  8:15 AM  Physician: Ursula Alert, MD 06/14/2017  8:15 AM  Nursing:  Sharyn Lull RN 06/14/2017  8:15 AM  RN Care Manager: Lars Pinks, RN 06/14/2017  8:15 AM  Social Worker: Adriana Reams, [name] 06/14/2017  8:15 AM  Recreational Therapist:  06/14/2017  8:15 AM  Other: Lindell Spar, NP; Marvia Pickles, NP 06/14/2017  8:15 AM  Other:  06/14/2017  8:15 AM    Scribe for Treatment Team: Adriana Reams, Helper Work [phone]

## 2017-06-14 NOTE — Progress Notes (Signed)
  [facility] Adult Case Management Discharge Plan :  Will you be returning to the same living situation after discharge:  Yes,  Pt returning home At discharge, do you have transportation home?: Yes,  Pt husband to pick up Do you have the ability to pay for your medications: Yes,  Pt provided with prescriptions  Release of information consent forms completed and in the chart;  Patient's signature needed at discharge.  Patient to Follow up at: Follow-up Information    [facility] up on 06/20/2017.   Specialty:  Family Medicine Why:  Monday, June 20, 2017 at 2:30pm for your hospital follow up appointment. Following this appointment, you have the option of being seen in [place] at [address] [place], [place] [address]  Contact information: [address] [place] [address] [phone]           Next level of care provider [facility]:no  Safety Planning and Suicide Prevention discussed: Yes,  with husband; see SPE note  Have you used any form of tobacco in the last 30 days? (Cigarettes, Smokeless Tobacco, Cigars, and/or Pipes): No  Has patient been referred to the Quitline?: N/A patient is not a smoker  Patient has been referred for addiction treatment: Yes  Verdene LennertLauren C Mitchelle Goerner, [name] 06/14/2017, 10:56 AM

## 2017-06-14 NOTE — Plan of Care (Signed)
Problem: Safety: Goal: Periods of time without injury will increase Outcome: Progressing Pt safe on the unit at this time   

## 2017-06-14 NOTE — Plan of Care (Signed)
Problem: Safety: Goal: Periods of time without injury will increase Outcome: Progressing Pt safe on the unit at this time  Problem: Coping: Goal: Ability to cope will improve Outcome: Progressing Pt stated her depression was decreased today

## 2017-07-11 ENCOUNTER — Emergency Department
Admission: EM | Admit: 2017-07-11 | Discharge: 2017-07-12 | Disposition: A | Discharge status: Home / Self Care | Payer: Medicaid Other | Attending: Emergency Medicine | Admitting: Emergency Medicine

## 2017-07-11 DIAGNOSIS — F32A Depression, unspecified: Secondary | ICD-10-CM

## 2017-07-11 DIAGNOSIS — F329 Major depressive disorder, single episode, unspecified: Secondary | ICD-10-CM | POA: Insufficient documentation

## 2017-07-11 DIAGNOSIS — Z87891 Personal history of nicotine dependence: Secondary | ICD-10-CM | POA: Diagnosis not present

## 2017-07-11 DIAGNOSIS — Z79899 Other long term (current) drug therapy: Secondary | ICD-10-CM | POA: Insufficient documentation

## 2017-07-11 NOTE — ED Triage Notes (Signed)
"  I need to be committed, I know I have to be careful about what I say.  I was just in the hospital for 7 days at the beginning of the month and I need to be admitted."  Patient reports having extensive mental health and medical health problems.

## 2017-07-12 DIAGNOSIS — F329 Major depressive disorder, single episode, unspecified: Secondary | ICD-10-CM

## 2017-07-12 DIAGNOSIS — T391X2A Poisoning by 4-Aminophenol derivatives, intentional self-harm, initial encounter: Secondary | ICD-10-CM | POA: Diagnosis not present

## 2017-07-12 NOTE — ED Notes (Signed)
Pt signed out AMA and states she wanted to leave after being asked to take out her nose ring per policy

## 2017-07-12 NOTE — ED Notes (Signed)
Pt refusing to remove piercings (x3) or surrender hair band. Pt informed of hospital policy and safety issues pertaining to such objects not being allowed in the behavioral section of the ED; pt continues to refuse and stated she "would rather sign myself out since I'm here voluntarily". Informed Dr Zenda Alpers who instructed me to speak with the pt's husband in the lobby regarding the pt's potential for SI/HI and possible need for IVC. Pt's husband stated that the pt was not SI/HI, but felt "she wanted to be seen because she feels chemically unbalanced". Dr Zenda Alpers informed and speaking with the pt at this time.

## 2017-07-12 NOTE — ED Provider Notes (Signed)
[facility] Emergency Department Provider Note   ____________________________________________   The patient was seen at approximately 0000  I have reviewed the triage vital signs and the nursing notes.   HISTORY  Chief Complaint Psychiatric Evaluation    HPI Brittany [name] is a 31 y.o. female who comes into the hospital today for a psychiatric evaluation.the patient states that she has a history of depression and wanted to come to see if we could help her. She reports that she needs resources and thinks that she may have a chemical imbalance and needs her medications adjusted. She reports that she sees a psychiatrist that works here and she prefers this hospital. The patient states that she was admitted to [facility] 2 weeks ago but she drove over an hour and a half to come here. She reports that she now wants to go home because she does not want to take out her piercings. She reports that she is here for help and to see her doctor. The patient states that she's been to multiple psychiatric hospitals in the past. The patient denies any suicidal or homicidal ideation. She reports that she was told by her psychiatrist that she could come here if she ever needed help. She reports though that after her initial evaluation she wants to go home.   Past Medical History:  Diagnosis Date  . ADHD (attention deficit hyperactivity disorder)   . Anxiety   . Arthritis    right shoulder, bilateral knees  . Bipolar disorder (HCC)   . Bipolar I disorder, most recent episode depressed (HCC)   . Chronic kidney disease    Kidney stones  . Depression   . Headache     Patient Active Problem List   Diagnosis Date Noted  . Bipolar disorder, curr episode mixed, severe, with psychotic features (HCC) 06/08/2017  . ADHD (attention deficit hyperactivity disorder) 09/15/2016  . Borderline personality disorder (HCC) 05/04/2016  . Overdose 05/03/2016  .  Noncompliance 05/03/2016    Past Surgical History:  Procedure Laterality Date  . CESAREAN SECTION    . CHOLECYSTECTOMY  2017  . FOOT SURGERY    . KIDNEY SURGERY    . NOVASURE ABLATION  2010  . SHOULDER SURGERY Right   . TUBAL LIGATION      Prior to Admission medications   Medication Sig Start Date End Date Taking? Authorizing Provider  alprazolam Prudy Feeler) 2 MG tablet Take 1 tablet (2 mg total) by mouth 3 (three) times daily. For severe anxiety 06/14/17   Armandina Stammer I, NP  amphetamine-dextroamphetamine (ADDERALL) 30 MG tablet Take 1 tablet by mouth every morning. For ADHD 06/14/17   Armandina Stammer I, NP  doxepin (SINEQUAN) 25 MG capsule Take 1 capsule (25 mg total) by mouth at bedtime. For sleep 06/14/17   Armandina Stammer I, NP  furosemide (LASIX) 20 MG tablet Take 1 tablet (20 mg total) by mouth daily. For swellings 06/14/17   Armandina Stammer I, NP  hydrOXYzine (ATARAX/VISTARIL) 25 MG tablet Take 1 tablet (25 mg total) by mouth every 8 (eight) hours as needed for anxiety. 06/14/17   Armandina Stammer I, NP  loratadine (CLARITIN) 10 MG tablet Take 1 tablet (10 mg total) by mouth daily. (May purchase from over the counter): For allergies, itching 06/15/17   Armandina Stammer I, NP  ondansetron (ZOFRAN-ODT) 8 MG disintegrating tablet Take 1 tablet (8 mg total) by mouth 2 (two) times daily as needed for nausea or vomiting. 06/14/17  Armandina Stammer I, NP  Oxcarbazepine (TRILEPTAL) 300 MG tablet Take 1 tablet (300 mg total) by mouth 2 (two) times daily. For mood stabilization 06/14/17   Armandina Stammer I, NP  perphenazine (TRILAFON) 8 MG tablet Take 1 tablet (8 mg total) by mouth at bedtime. For mood control 06/14/17   Sanjuana Kava, NP    Allergies Ceclor [cefaclor]; Wasabi (wasabia [place]); and Adhesive [tape]  Family History  Problem Relation Age of Onset  . Anxiety disorder Mother   . Depression Mother   . Drug abuse Mother   . Bipolar disorder Mother   . Depression Father   . Anxiety disorder Father   . Drug abuse  Father   . Anxiety disorder Sister   . Depression Sister   . ADD / ADHD Sister   . Anxiety disorder Sister   . Depression Sister     Social History Social History  Substance Use Topics  . Smoking status: Former Smoker    Types: E-cigarettes  . Smokeless tobacco: Never Used  . Alcohol use 2.4 - 7.2 oz/week    4 Glasses of wine per week     Comment: "couple weeks"    Review of Systems  Constitutional: No fever/chills Eyes: No visual changes. ENT: No sore throat. Cardiovascular: Denies chest pain. Respiratory: Denies shortness of breath. Gastrointestinal: No abdominal pain.   Genitourinary: Negative for dysuria. Musculoskeletal: Negative for back pain. Skin: Negative for rash. Neurological: Negative for headaches Psychiatric:depression   ____________________________________________   PHYSICAL EXAM:  VITAL SIGNS: ED Triage Vitals  Enc Vitals Group     BP 07/11/17 2237 126/85     Pulse Rate 07/11/17 2237 (!) 101     Resp 07/11/17 2237 20     Temp 07/11/17 2237 98.5 F (36.9 C)     Temp Source 07/11/17 2237 Oral     SpO2 07/11/17 2237 98 %     Weight 07/11/17 2238 228 lb (103.4 kg)     Height 07/11/17 2238  (1.626 m)     Head Circumference --      Peak Flow --      Pain Score --      Pain Loc --      Pain Edu? --      Excl. in GC? --     Constitutional: Alert and oriented. Well appearing and in no acute distress. The patient is standing in the middle of the room. Eyes: EOMI. Head: Atraumatic. Nose: No congestion/rhinnorhea. Mouth/Throat: Mucous membranes are moist.   Neck: No stridor.   Cardiovascular: Patient refused Respiratory: Patient breathing comfortably Gastrointestinal: patient refused Musculoskeletal: No lower extremity tenderness nor edema.  No joint effusions. Neurologic:  Normal speech and language.  Skin:  Skin is intact. No rash noted. By visual inspection Psychiatric: Patient  agitated  ____________________________________________   LABS (all labs ordered are listed, but only abnormal results are displayed)  Labs Reviewed  COMPREHENSIVE METABOLIC PANEL  ETHANOL  SALICYLATE LEVEL  ACETAMINOPHEN LEVEL  CBC  URINE DRUG SCREEN, QUALITATIVE ([facility] ONLY)   ____________________________________________  EKG  none ____________________________________________  RADIOLOGY  No results found.  ____________________________________________   PROCEDURES  Procedure(s) performed: None  Procedures  Critical Care performed: No  ____________________________________________   INITIAL IMPRESSION / ASSESSMENT AND PLAN / ED COURSE  Pertinent labs & imaging results that were available during my care of the patient were reviewed by me and considered in my medical decision making (see chart for details).  this is a 31 year old female  who comes into the hospital today for psychiatric evaluation. According to the staff the patient initially did not want address out and did not want to have her blood drawn. She also did not want to remove her piercings. The patient became very upset at the fact that she had to do these things and decided she wanted to leave. I went into the room to have this conversation with the patient because she states that she no longer wanted to be here. She reports that she just wants to go home and states that she has been cooperative but she was told that she could not be evaluated if she did not comply. I informed her that we do have policies and we are willing to work with her to have her evaluated but we didn't need her blood work as well as her urine. The patient states that she tried to urinate and she tried to comply but she reports that she no longer wants to stay and will follow-up with Dr. Toni Amend as an outpatient. The patient again is not suicidal so she will be signed out AGAINST MEDICAL ADVICE for further evaluation.       ____________________________________________   FINAL CLINICAL IMPRESSION(S) / ED DIAGNOSES  Final diagnoses:  Depression, unspecified depression type      NEW MEDICATIONS STARTED DURING THIS VISIT:  Discharge Medication List as of 07/12/2017 12:32 AM       Note:  This document was prepared using Dragon voice recognition software and may include unintentional dictation errors.    Rebecka Apley, MD 07/12/17 [phone]

## 2017-07-13 DIAGNOSIS — F329 Major depressive disorder, single episode, unspecified: Secondary | ICD-10-CM | POA: Diagnosis not present

## 2017-07-13 DIAGNOSIS — R45851 Suicidal ideations: Secondary | ICD-10-CM | POA: Diagnosis not present

## 2017-07-13 DIAGNOSIS — T391X2A Poisoning by 4-Aminophenol derivatives, intentional self-harm, initial encounter: Secondary | ICD-10-CM | POA: Diagnosis not present

## 2017-07-13 DIAGNOSIS — F319 Bipolar disorder, unspecified: Secondary | ICD-10-CM | POA: Diagnosis not present

## 2017-07-14 DIAGNOSIS — R45851 Suicidal ideations: Secondary | ICD-10-CM | POA: Diagnosis not present

## 2017-07-14 DIAGNOSIS — F329 Major depressive disorder, single episode, unspecified: Secondary | ICD-10-CM | POA: Diagnosis not present

## 2017-07-14 DIAGNOSIS — F319 Bipolar disorder, unspecified: Secondary | ICD-10-CM | POA: Diagnosis not present

## 2017-07-14 DIAGNOSIS — T391X2A Poisoning by 4-Aminophenol derivatives, intentional self-harm, initial encounter: Secondary | ICD-10-CM | POA: Diagnosis not present

## 2017-07-15 DIAGNOSIS — T391X2A Poisoning by 4-Aminophenol derivatives, intentional self-harm, initial encounter: Secondary | ICD-10-CM | POA: Diagnosis not present

## 2017-07-15 DIAGNOSIS — F329 Major depressive disorder, single episode, unspecified: Secondary | ICD-10-CM | POA: Diagnosis not present

## 2017-07-15 DIAGNOSIS — F319 Bipolar disorder, unspecified: Secondary | ICD-10-CM | POA: Diagnosis not present

## 2017-07-15 DIAGNOSIS — R45851 Suicidal ideations: Secondary | ICD-10-CM | POA: Diagnosis not present

## 2017-07-16 DIAGNOSIS — F329 Major depressive disorder, single episode, unspecified: Secondary | ICD-10-CM | POA: Diagnosis not present

## 2017-07-16 DIAGNOSIS — R45851 Suicidal ideations: Secondary | ICD-10-CM | POA: Diagnosis not present

## 2017-07-16 DIAGNOSIS — F319 Bipolar disorder, unspecified: Secondary | ICD-10-CM | POA: Diagnosis not present

## 2017-07-16 DIAGNOSIS — T391X2A Poisoning by 4-Aminophenol derivatives, intentional self-harm, initial encounter: Secondary | ICD-10-CM | POA: Diagnosis not present

## 2017-07-17 DIAGNOSIS — T391X2A Poisoning by 4-Aminophenol derivatives, intentional self-harm, initial encounter: Secondary | ICD-10-CM | POA: Diagnosis not present

## 2017-07-17 DIAGNOSIS — F329 Major depressive disorder, single episode, unspecified: Secondary | ICD-10-CM | POA: Diagnosis not present

## 2017-07-17 DIAGNOSIS — F319 Bipolar disorder, unspecified: Secondary | ICD-10-CM | POA: Diagnosis not present

## 2017-07-17 DIAGNOSIS — R45851 Suicidal ideations: Secondary | ICD-10-CM | POA: Diagnosis not present

## 2017-07-18 ENCOUNTER — Inpatient Hospital Stay (HOSPITAL_COMMUNITY)
Admission: AD | Admit: 2017-07-18 | Discharge: 2017-07-22 | DRG: 885 | Disposition: A | Discharge status: Home / Self Care | Payer: Medicaid Other | Attending: Psychiatry | Admitting: Psychiatry

## 2017-07-18 ENCOUNTER — Encounter (HOSPITAL_COMMUNITY): Payer: Self-pay

## 2017-07-18 DIAGNOSIS — Z79899 Other long term (current) drug therapy: Secondary | ICD-10-CM

## 2017-07-18 DIAGNOSIS — G4700 Insomnia, unspecified: Secondary | ICD-10-CM | POA: Diagnosis not present

## 2017-07-18 DIAGNOSIS — T391X2A Poisoning by 4-Aminophenol derivatives, intentional self-harm, initial encounter: Secondary | ICD-10-CM | POA: Diagnosis present

## 2017-07-18 DIAGNOSIS — Z23 Encounter for immunization: Secondary | ICD-10-CM

## 2017-07-18 DIAGNOSIS — N189 Chronic kidney disease, unspecified: Secondary | ICD-10-CM | POA: Diagnosis present

## 2017-07-18 DIAGNOSIS — Z818 Family history of other mental and behavioral disorders: Secondary | ICD-10-CM

## 2017-07-18 DIAGNOSIS — F319 Bipolar disorder, unspecified: Principal | ICD-10-CM | POA: Diagnosis present

## 2017-07-18 DIAGNOSIS — F1210 Cannabis abuse, uncomplicated: Secondary | ICD-10-CM | POA: Diagnosis not present

## 2017-07-18 DIAGNOSIS — Z888 Allergy status to other drugs, medicaments and biological substances status: Secondary | ICD-10-CM

## 2017-07-18 DIAGNOSIS — F909 Attention-deficit hyperactivity disorder, unspecified type: Secondary | ICD-10-CM | POA: Diagnosis present

## 2017-07-18 DIAGNOSIS — Z813 Family history of other psychoactive substance abuse and dependence: Secondary | ICD-10-CM | POA: Diagnosis not present

## 2017-07-18 DIAGNOSIS — Z91018 Allergy to other foods: Secondary | ICD-10-CM

## 2017-07-18 DIAGNOSIS — T1491XA Suicide attempt, initial encounter: Secondary | ICD-10-CM | POA: Diagnosis not present

## 2017-07-18 DIAGNOSIS — Z87442 Personal history of urinary calculi: Secondary | ICD-10-CM | POA: Diagnosis not present

## 2017-07-18 DIAGNOSIS — F39 Unspecified mood [affective] disorder: Secondary | ICD-10-CM | POA: Diagnosis not present

## 2017-07-18 DIAGNOSIS — R45851 Suicidal ideations: Secondary | ICD-10-CM | POA: Diagnosis not present

## 2017-07-18 DIAGNOSIS — F329 Major depressive disorder, single episode, unspecified: Secondary | ICD-10-CM | POA: Diagnosis not present

## 2017-07-18 DIAGNOSIS — R4587 Impulsiveness: Secondary | ICD-10-CM | POA: Diagnosis not present

## 2017-07-18 DIAGNOSIS — Z87891 Personal history of nicotine dependence: Secondary | ICD-10-CM | POA: Diagnosis not present

## 2017-07-18 DIAGNOSIS — Y92009 Unspecified place in unspecified non-institutional (private) residence as the place of occurrence of the external cause: Secondary | ICD-10-CM | POA: Diagnosis not present

## 2017-07-18 DIAGNOSIS — R450 Nervousness: Secondary | ICD-10-CM | POA: Diagnosis not present

## 2017-07-18 DIAGNOSIS — F17210 Nicotine dependence, cigarettes, uncomplicated: Secondary | ICD-10-CM | POA: Diagnosis not present

## 2017-07-18 DIAGNOSIS — R4584 Anhedonia: Secondary | ICD-10-CM | POA: Diagnosis not present

## 2017-07-18 DIAGNOSIS — F419 Anxiety disorder, unspecified: Secondary | ICD-10-CM | POA: Diagnosis present

## 2017-07-18 DIAGNOSIS — F1910 Other psychoactive substance abuse, uncomplicated: Secondary | ICD-10-CM | POA: Diagnosis not present

## 2017-07-18 DIAGNOSIS — Z62810 Personal history of physical and sexual abuse in childhood: Secondary | ICD-10-CM | POA: Diagnosis present

## 2017-07-18 DIAGNOSIS — F1290 Cannabis use, unspecified, uncomplicated: Secondary | ICD-10-CM | POA: Diagnosis not present

## 2017-07-18 DIAGNOSIS — R4582 Worries: Secondary | ICD-10-CM | POA: Diagnosis not present

## 2017-07-18 DIAGNOSIS — Z91048 Other nonmedicinal substance allergy status: Secondary | ICD-10-CM | POA: Diagnosis not present

## 2017-07-18 DIAGNOSIS — M1990 Unspecified osteoarthritis, unspecified site: Secondary | ICD-10-CM | POA: Diagnosis present

## 2017-07-18 DIAGNOSIS — Z9114 Patient's other noncompliance with medication regimen: Secondary | ICD-10-CM | POA: Diagnosis not present

## 2017-07-18 MED ORDER — FUROSEMIDE 20 MG PO TABS
20.0000 mg | ORAL_TABLET | Freq: Every day | ORAL | Status: DC
  Administered 2017-07-20 – 2017-07-22 (×3): 20 mg via ORAL
  Filled 2017-07-18 (×7): qty 1

## 2017-07-18 MED ORDER — ACETAMINOPHEN 325 MG PO TABS: 650.0000 mg | ORAL_TABLET | Freq: Four times a day (QID) | ORAL | Status: DC | PRN

## 2017-07-18 MED ORDER — LORAZEPAM 1 MG PO TABS
1.0000 mg | ORAL_TABLET | Freq: Four times a day (QID) | ORAL | Status: DC | PRN
  Administered 2017-07-18: 1 mg via ORAL
  Filled 2017-07-18: qty 1

## 2017-07-18 MED ORDER — ONDANSETRON 4 MG PO TBDP: 4.0000 mg | ORAL_TABLET | Freq: Four times a day (QID) | ORAL | Status: AC | PRN

## 2017-07-18 MED ORDER — OXCARBAZEPINE 300 MG PO TABS
300.0000 mg | ORAL_TABLET | Freq: Two times a day (BID) | ORAL | Status: DC
  Administered 2017-07-19 – 2017-07-22 (×5): 300 mg via ORAL
  Filled 2017-07-18 (×13): qty 1

## 2017-07-18 MED ORDER — ALUM & MAG HYDROXIDE-SIMETH 200-200-20 MG/5ML PO SUSP: 30.0000 mL | ORAL | Status: DC | PRN

## 2017-07-18 MED ORDER — PERPHENAZINE 8 MG PO TABS
8.0000 mg | ORAL_TABLET | Freq: Every day | ORAL | Status: DC
  Filled 2017-07-18 (×3): qty 1

## 2017-07-18 MED ORDER — ADULT MULTIVITAMIN W/MINERALS CH
1.0000 | ORAL_TABLET | Freq: Every day | ORAL | Status: DC
  Filled 2017-07-18 (×4): qty 1

## 2017-07-18 MED ORDER — LOPERAMIDE HCL 2 MG PO CAPS: 2.0000 mg | ORAL_CAPSULE | ORAL | Status: AC | PRN

## 2017-07-18 MED ORDER — MAGNESIUM HYDROXIDE 400 MG/5ML PO SUSP: 30.0000 mL | Freq: Every day | ORAL | Status: DC | PRN

## 2017-07-18 MED ORDER — LORATADINE 10 MG PO TABS
10.0000 mg | ORAL_TABLET | Freq: Every day | ORAL | Status: DC
  Administered 2017-07-20 – 2017-07-22 (×3): 10 mg via ORAL
  Filled 2017-07-18 (×8): qty 1

## 2017-07-18 MED ORDER — TRAZODONE HCL 150 MG PO TABS
150.0000 mg | ORAL_TABLET | Freq: Every evening | ORAL | Status: DC | PRN
  Administered 2017-07-18 – 2017-07-21 (×6): 150 mg via ORAL
  Filled 2017-07-18 (×7): qty 1
  Filled 2017-07-18: qty 2
  Filled 2017-07-18 (×5): qty 1

## 2017-07-18 MED ORDER — HYDROXYZINE HCL 25 MG PO TABS
25.0000 mg | ORAL_TABLET | Freq: Three times a day (TID) | ORAL | Status: DC | PRN
  Administered 2017-07-18: 25 mg via ORAL
  Filled 2017-07-18: qty 1
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
## 2017-07-19 DIAGNOSIS — G4700 Insomnia, unspecified: Secondary | ICD-10-CM

## 2017-07-19 DIAGNOSIS — Z818 Family history of other mental and behavioral disorders: Secondary | ICD-10-CM

## 2017-07-19 DIAGNOSIS — R4584 Anhedonia: Secondary | ICD-10-CM

## 2017-07-19 DIAGNOSIS — F17210 Nicotine dependence, cigarettes, uncomplicated: Secondary | ICD-10-CM

## 2017-07-19 DIAGNOSIS — F319 Bipolar disorder, unspecified: Principal | ICD-10-CM

## 2017-07-19 DIAGNOSIS — F39 Unspecified mood [affective] disorder: Secondary | ICD-10-CM

## 2017-07-19 DIAGNOSIS — R4582 Worries: Secondary | ICD-10-CM

## 2017-07-19 DIAGNOSIS — R4587 Impulsiveness: Secondary | ICD-10-CM

## 2017-07-19 DIAGNOSIS — Z813 Family history of other psychoactive substance abuse and dependence: Secondary | ICD-10-CM

## 2017-07-19 DIAGNOSIS — T1491XA Suicide attempt, initial encounter: Secondary | ICD-10-CM

## 2017-07-19 DIAGNOSIS — R450 Nervousness: Secondary | ICD-10-CM

## 2017-07-19 DIAGNOSIS — F419 Anxiety disorder, unspecified: Secondary | ICD-10-CM

## 2017-07-19 DIAGNOSIS — T391X2A Poisoning by 4-Aminophenol derivatives, intentional self-harm, initial encounter: Secondary | ICD-10-CM

## 2017-07-19 LAB — HEPATIC FUNCTION PANEL
ALK PHOS: 63 U/L (ref 38–126)
ALT: 24 U/L (ref 14–54)
AST: 21 U/L (ref 15–41)
Albumin: 4.1 g/dL (ref 3.5–5.0)
BILIRUBIN TOTAL: 0.5 mg/dL (ref 0.3–1.2)
Bilirubin, Direct: <0.1 mg/dL — ABNORMAL LOW (ref 0.1–0.5)
TOTAL PROTEIN: 7.3 g/dL (ref 6.5–8.1)

## 2017-07-19 MED ORDER — THIAMINE HCL 100 MG/ML IJ SOLN: 100.0000 mg | Freq: Once | INTRAMUSCULAR | Status: DC

## 2017-07-19 MED ORDER — OLANZAPINE 5 MG PO TBDP
5.0000 mg | ORAL_TABLET | Freq: Once | ORAL | Status: AC
  Administered 2017-07-19: 5 mg via ORAL
  Filled 2017-07-19 (×2): qty 1

## 2017-07-19 MED ORDER — LORAZEPAM 1 MG PO TABS
1.0000 mg | ORAL_TABLET | Freq: Four times a day (QID) | ORAL | Status: AC
  Administered 2017-07-19 – 2017-07-20 (×4): 1 mg via ORAL
  Filled 2017-07-19 (×4): qty 1

## 2017-07-19 MED ORDER — INFLUENZA VAC SPLIT QUAD 0.5 ML IM SUSY
0.5000 mL | PREFILLED_SYRINGE | INTRAMUSCULAR | Status: AC
  Administered 2017-07-20: 0.5 mL via INTRAMUSCULAR
  Filled 2017-07-19: qty 0.5

## 2017-07-19 MED ORDER — PERPHENAZINE 8 MG PO TABS
8.0000 mg | ORAL_TABLET | Freq: Two times a day (BID) | ORAL | Status: DC
  Administered 2017-07-19: 8 mg via ORAL
  Filled 2017-07-19 (×6): qty 1

## 2017-07-19 MED ORDER — VITAMIN B-1 100 MG PO TABS
100.0000 mg | ORAL_TABLET | Freq: Every day | ORAL | Status: DC
  Administered 2017-07-20 – 2017-07-22 (×3): 100 mg via ORAL
  Filled 2017-07-19 (×5): qty 1

## 2017-07-19 MED ORDER — ADULT MULTIVITAMIN W/MINERALS CH
1.0000 | ORAL_TABLET | Freq: Every day | ORAL | Status: DC
  Administered 2017-07-19 – 2017-07-22 (×4): 1 via ORAL
  Filled 2017-07-19 (×6): qty 1

## 2017-07-19 MED ORDER — ARIPIPRAZOLE 5 MG PO TABS
5.0000 mg | ORAL_TABLET | Freq: Every day | ORAL | Status: DC
  Administered 2017-07-19 – 2017-07-21 (×3): 5 mg via ORAL
  Filled 2017-07-19 (×4): qty 1

## 2017-07-19 MED ORDER — LORAZEPAM 1 MG PO TABS
1.0000 mg | ORAL_TABLET | Freq: Two times a day (BID) | ORAL | Status: DC
  Administered 2017-07-22: 1 mg via ORAL
  Filled 2017-07-19: qty 1

## 2017-07-19 MED ORDER — LORAZEPAM 1 MG PO TABS: 1.0000 mg | ORAL_TABLET | Freq: Every day | ORAL | Status: DC

## 2017-07-19 MED ORDER — LORAZEPAM 1 MG PO TABS
1.0000 mg | ORAL_TABLET | Freq: Three times a day (TID) | ORAL | Status: AC
  Administered 2017-07-21 (×3): 1 mg via ORAL
  Filled 2017-07-19 (×3): qty 1

## 2017-07-19 MED ORDER — HYDROXYZINE HCL 25 MG PO TABS
25.0000 mg | ORAL_TABLET | Freq: Four times a day (QID) | ORAL | Status: DC | PRN
  Administered 2017-07-21: 25 mg via ORAL
  Filled 2017-07-19: qty 1
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
## 2017-07-19 NOTE — Progress Notes (Signed)
Adult Psychoeducational Group Note  Date:  07/19/2017 Time:  8:47 PM  Group Topic/Focus:  Wrap-Up Group:   The focus of this group is to help patients review their daily goal of treatment and discuss progress on daily workbooks.  Participation Level:  Active  Participation Quality:  Appropriate  Affect:  Appropriate  Cognitive:  Appropriate  Insight: Appropriate  Engagement in Group:  Engaged  Modes of Intervention:  Discussion  Additional Comments: The patient expressed that she attend all groups.The patient also said that her day was great and rates today a 10.  Octavio Manns 07/19/2017, 8:47 PM
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
## 2017-07-19 NOTE — Progress Notes (Signed)
Recreation Therapy Notes  Patient admitted to unit 07/18/17. Due to admission within last year, no new assessment conducted at this time. Last assessment conducted 06/09/17. Patient reports school as her main stressor as opposed to previous admission.  Patient reports catalyst for admission was "a bad med change".  Patient denies SI, HI, AVH at this time. Patient reports goal of getting her mania under control.  Information found below from assessment conducted 06/09/17:  Coping Skills:  Isolate, Arguments, Avoidance, Self-Injury, Exercise, Music  Personal Challenges:  Anger, Concentration, Decision-Making, Problem-Solving, Relationships, Self-Esteem/Confidence, Stress Management, Time Management, Trusting Others  Leisure Interests:  Music-Listen, Sports-Swimming, Nature, [employer]- Other, Sports-Exercise (Cardio/Circuits, get tatoos, Pharmacist, community, go for drives)    Caroll Rancher, LRT/CTRS        Lillia Abed, Javel Hersh A 07/19/2017 3:18 PM

## 2017-07-19 NOTE — Progress Notes (Signed)
Patient denies SI, HI and AVH.  Patient has been attending groups and engaged in unit activities.  Patient had no incidents of behavioral dyscontrol.   Assess patient for safety, offer medications as prescribed, and engage patient in 1:1 staff talks, encourage patient to use effective coping skills.   Patient able to contract for safety, Continue to monitor for safety.  
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
## 2017-07-19 NOTE — Tx Team (Signed)
Initial Treatment Plan 07/19/2017 12:26 AM Brittany [name] [id number]    PATIENT STRESSORS: Financial difficulties Medication change or noncompliance   PATIENT STRENGTHS: General fund of knowledge Physical Health Supportive family/friends   PATIENT IDENTIFIED PROBLEMS: Depression   Suicidal ideation/attempt  "I would like to have some resources"  "I need medication stabilization"  "Hoping to leave soon"             DISCHARGE CRITERIA:  Improved stabilization in mood, thinking, and/or behavior Verbal commitment to aftercare and medication compliance  PRELIMINARY DISCHARGE PLAN: Outpatient therapy Medication management  PATIENT/FAMILY INVOLVEMENT: This treatment plan has been presented to and reviewed with the patient, Brittany [name].  The patient and family have been given the opportunity to ask questions and make suggestions.  Levin Bacon, RN 07/19/2017, 12:26 AM

## 2017-07-19 NOTE — Progress Notes (Signed)
  DATA ACTION RESPONSE  Objective- Pt. is visible in the dayroom, seen interacting with peers. Presents with a labile/anxious affect and mood. Pt states she didn't sleep well last night. No new c/o.  Subjective- Denies having any SI/HI/AVH/Pain at this time. Is cooperative and remain safe on the unit.  1:1 interaction in private to establish rapport. Encouragement, education, & support given from staff.    Safety maintained with Q 15 checks. Continue with POC.

## 2017-07-19 NOTE — Plan of Care (Signed)
Problem: Safety: Goal: Periods of time without injury will increase Outcome: Progressing Pt remains a low fall risk, denies SI/HI at this time.   

## 2017-07-19 NOTE — Progress Notes (Signed)
Brittany [name] is a 31 year old female being admitted involuntarily to 37-2 from [facility] Med floor.  She was medically hospitalized after intentionally ingesting 100-150 Tylenol  tablets.  Her husband intervened and brought her to the hospital.  She denied HI or A/V hallucinations.  She has history of multiple psychiatric hospitalizations for suicide attempts.  During [facility] admission, she denies SI/HI or A/V hallucinations.  She stated that she is feeling better since being in the medical hospital.  Oriented her to the unit.  Admission paperwork completed and signed.  Belongings searched and secured in locker # 21.  Skin assessment completed and noted multiple bruises both upper/lower arms, large bruise on left hip/buttock, multiple self inflicted lacerations to upper right thigh(healing) and abrasion to right knee .  Q 15 minute checks initiated for safety.  We will monitor the progress towards her goals.

## 2017-07-19 NOTE — Progress Notes (Signed)
Recreation Therapy Notes  Date: 07/19/17 Time: 1000 Location: 500 Dayroom  Group Topic: Wellness  Goal Area(s) Addresses:  Patient will define components of whole wellness. Patient will verbalize benefit of whole wellness.  Behavioral Response: Engaged  Intervention:  2 Decks of Cards   Activity: Deck of Chance.  LRT had 2 decks of cards.  Each patient was given 2 cards from one deck of playing cards.  LRT would pull a card from a separate deck of cards.  If a patients number was pulled, they had to perform the exercise that goes with that card.  Education: Wellness, Building control surveyor.   Education Outcome: Acknowledges education/In group clarification offered/Needs additional education.   Clinical Observations/Feedback:  Pt was very active and social during group.  Pt was smiling, social and appeared to be enjoying herself.  Pt stated exercise "increases serotonin and endorphins which increases dopamine and makes you happy".      Caroll Rancher, LRT/CTRS        Caroll Rancher A 07/19/2017 12:03 PM

## 2017-07-19 NOTE — BHH Group Notes (Signed)
BHH [name] Group Therapy 07/19/2017 1:15pm  Type of Therapy: Group Therapy- Feelings Around Discharge & Establishing a Supportive Framework  Participation Level:  Active  Description of Group:   What is a supportive framework? What does it look like feel like and how do I discern it from and unhealthy non-supportive network? Learn how to cope when supports are not helpful and don't support you. Discuss what to do when your family/friends are not supportive.  Summary of Patient Progress  Stayed the entire time, engaged throughout.  "I feel like I belong on the [place] of misfit toys. But I refuse to let my diagnosis define me. My kids a re really important to me-they motivate me to be the best mom I can be.  And I want to get my education too.  I refuse to give up on that.  I would never commit suicide.  Sometimes I just need to get people's attention because they are not listening."   Therapeutic Modalities:   Cognitive Behavioral Therapy Person-Centered Therapy Motivational Interviewing   Ida [name], [name] 07/19/2017 3:25 PM

## 2017-07-20 DIAGNOSIS — F1910 Other psychoactive substance abuse, uncomplicated: Secondary | ICD-10-CM

## 2017-07-20 DIAGNOSIS — F1290 Cannabis use, unspecified, uncomplicated: Secondary | ICD-10-CM

## 2017-07-20 DIAGNOSIS — Z87891 Personal history of nicotine dependence: Secondary | ICD-10-CM

## 2017-07-20 DIAGNOSIS — Z9114 Patient's other noncompliance with medication regimen: Secondary | ICD-10-CM

## 2017-07-20 NOTE — Tx Team (Signed)
Interdisciplinary Treatment and Diagnostic Plan Update  07/20/2017 Time of Session: 4:08 PM  Brittany [name] MRN: [id number]  Principal Diagnosis: Bipolar 1 disorder [facility])  Secondary Diagnoses: Principal Problem:   Bipolar 1 disorder ([place])   Current Medications:  Current Facility-Administered Medications  Medication Dose Route Frequency Provider Last Rate Last Dose  . alum & mag hydroxide-simeth (MAALOX/MYLANTA) 200-200-20 MG/5ML suspension 30 mL  30 mL Oral Q4H PRN Rankin, Shuvon B, NP      . ARIPiprazole (ABILIFY) tablet 5 mg  5 mg Oral Daily Derrill Center, NP   5 mg at 07/19/17 1844  . furosemide (LASIX) tablet 20 mg  20 mg Oral Daily Rankin, Shuvon B, NP   20 mg at 07/20/17 0847  . hydrOXYzine (ATARAX/VISTARIL) tablet 25 mg  25 mg Oral Q6H PRN Derrill Center, NP      . Influenza vac split quadrivalent PF (FLUARIX) injection 0.5 mL  0.5 mL Intramuscular Tomorrow-1000 Cobos, Fernando A, MD      . loperamide (IMODIUM) capsule 2-4 mg  2-4 mg Oral PRN Rankin, Shuvon B, NP      . loratadine (CLARITIN) tablet 10 mg  10 mg Oral Daily Rankin, Shuvon B, NP      . LORazepam (ATIVAN) tablet 1 mg  1 mg Oral QID Derrill Center, NP   1 mg at 07/19/17 2119   Followed by  . [START ON 07/21/2017] LORazepam (ATIVAN) tablet 1 mg  1 mg Oral TID Derrill Center, NP       Followed by  . [START ON 07/22/2017] LORazepam (ATIVAN) tablet 1 mg  1 mg Oral BID Derrill Center, NP       Followed by  . [START ON 07/23/2017] LORazepam (ATIVAN) tablet 1 mg  1 mg Oral Daily Lewis, Tanika N, NP      . magnesium hydroxide (MILK OF MAGNESIA) suspension 30 mL  30 mL Oral Daily PRN Rankin, Shuvon B, NP      . multivitamin with minerals tablet 1 tablet  1 tablet Oral Daily Derrill Center, NP   1 tablet at 07/19/17 1842  . ondansetron (ZOFRAN-ODT) disintegrating tablet 4 mg  4 mg Oral Q6H PRN Rankin, Shuvon B, NP      . Oxcarbazepine (TRILEPTAL) tablet 300 mg  300 mg Oral BID Rankin, Shuvon B, NP   300 mg at 07/19/17  1842  . thiamine (B-1) injection 100 mg  100 mg Intramuscular Once Derrill Center, NP      . thiamine (VITAMIN B-1) tablet 100 mg  100 mg Oral Daily Derrill Center, NP      . traZODone (DESYREL) tablet 150 mg  150 mg Oral QHS,MR X 1 Laverle Hobby, PA-C   150 mg at 07/19/17 2119    PTA Medications: Prescriptions Prior to Admission  Medication Sig Dispense Refill Last Dose  . alprazolam (XANAX) 2 MG tablet Take 1 tablet (2 mg total) by mouth 3 (three) times daily. For severe anxiety 1 tablet 0   . amphetamine-dextroamphetamine (ADDERALL) 30 MG tablet Take 1 tablet by mouth every morning. For ADHD  0   . doxepin (SINEQUAN) 25 MG capsule Take 1 capsule (25 mg total) by mouth at bedtime. For sleep 30 capsule 0   . furosemide (LASIX) 20 MG tablet Take 1 tablet (20 mg total) by mouth daily. For swellings 1 tablet 0   . hydrOXYzine (ATARAX/VISTARIL) 25 MG tablet Take 1 tablet (25 mg total) by mouth every 8 (eight) hours  as needed for anxiety. 60 tablet 0   . loratadine (CLARITIN) 10 MG tablet Take 1 tablet (10 mg total) by mouth daily. (May purchase from over the counter): For allergies, itching     . ondansetron (ZOFRAN-ODT) 8 MG disintegrating tablet Take 1 tablet (8 mg total) by mouth 2 (two) times daily as needed for nausea or vomiting. 1 tablet 0   . Oxcarbazepine (TRILEPTAL) 300 MG tablet Take 1 tablet (300 mg total) by mouth 2 (two) times daily. For mood stabilization 60 tablet 0   . perphenazine (TRILAFON) 8 MG tablet Take 1 tablet (8 mg total) by mouth at bedtime. For mood control 30 tablet 0     Patient Stressors: Financial difficulties Medication change or noncompliance  Patient Strengths: General fund of knowledge Physical Health Supportive family/friends  Treatment Modalities: Medication Management, Group therapy, Case management,  1 to 1 session with clinician, Psychoeducation, Recreational therapy.   Physician Treatment Plan for Primary Diagnosis: Bipolar 1 disorder  ([place]) Long Term Goal(s): Improvement in symptoms so as ready for discharge  Short Term Goals: Ability to identify changes in lifestyle to reduce recurrence of condition will improve Ability to verbalize feelings will improve Ability to disclose and discuss suicidal ideas Compliance with prescribed medications will improve Ability to verbalize feelings will improve Ability to disclose and discuss suicidal ideas Compliance with prescribed medications will improve  Medication Management: Evaluate patient's response, side effects, and tolerance of medication regimen.  Therapeutic Interventions: 1 to 1 sessions, Unit Group sessions and Medication administration.  Evaluation of Outcomes: Progressing  Physician Treatment Plan for Secondary Diagnosis: Principal Problem:   Bipolar 1 disorder ([place])   Long Term Goal(s): Improvement in symptoms so as ready for discharge  Short Term Goals: Ability to identify changes in lifestyle to reduce recurrence of condition will improve Ability to verbalize feelings will improve Ability to disclose and discuss suicidal ideas Compliance with prescribed medications will improve Ability to verbalize feelings will improve Ability to disclose and discuss suicidal ideas Compliance with prescribed medications will improve  Medication Management: Evaluate patient's response, side effects, and tolerance of medication regimen.  Therapeutic Interventions: 1 to 1 sessions, Unit Group sessions and Medication administration.  Evaluation of Outcomes: Progressing   RN Treatment Plan for Primary Diagnosis: Bipolar 1 disorder ([place]) Long Term Goal(s): Knowledge of disease and therapeutic regimen to maintain health will improve  Short Term Goals: Ability to identify and develop effective coping behaviors will improve and Compliance with prescribed medications will improve  Medication Management: RN will administer medications as ordered by provider, will assess and  evaluate patient's response and provide education to patient for prescribed medication. RN will report any adverse and/or side effects to prescribing provider.  Therapeutic Interventions: 1 on 1 counseling sessions, Psychoeducation, Medication administration, Evaluate responses to treatment, Monitor vital signs and CBGs as ordered, Perform/monitor CIWA, COWS, AIMS and Fall Risk screenings as ordered, Perform wound care treatments as ordered.  Evaluation of Outcomes: Progressing   [name] Treatment Plan for Primary Diagnosis: Bipolar 1 disorder ([place]) Long Term Goal(s): Safe transition to appropriate next level of care at discharge, Engage patient in therapeutic group addressing interpersonal concerns.  Short Term Goals: Engage patient in aftercare planning with referrals and resources  Therapeutic Interventions: Assess for all discharge needs, 1 to 1 time with Social worker, Explore available resources and support systems, Assess for adequacy in community support network, Educate family and significant other(s) on suicide prevention, Complete Psychosocial Assessment, Interpersonal group therapy.  Evaluation of Outcomes:  Met Return home, follow up [place] in Treatment: Attending groups: Yes Participating in groups: Yes Taking medication as prescribed: Yes Toleration medication: Yes, no side effects reported at this time Family/Significant other contact made: No Patient understands diagnosis: No  Limited insight Discussing patient identified problems/goals with staff: Yes Medical problems stabilized or resolved: Yes Denies suicidal/homicidal ideation: Yes Issues/concerns per patient self-inventory: None Other: N/A  New problem(s) identified: None identified at this time.   New Short Term/Long Term Goal(s): "My meds were not working when I left, so I quit taking them.  Then I saw the clouds gathering, and I knew I needed help, but no one would help me, so I took an overdose  so I would get help.  I knew I was taking an amount that would not kill me."   Discharge Plan or Barriers:   Reason for Continuation of Hospitalization:  Mood instability   Hallucinations  Medication stabilization Suicidal ideation   Estimated Length of Stay: 10/15  Attendees: Patient: Brittany [name] 07/20/2017  4:08 PM  Physician: [name], MD 07/20/2017  4:08 PM  Nursing: Sena Hitch, RN 07/20/2017  4:08 PM  RN Care Manager: Lars Pinks, RN 07/20/2017  4:08 PM  Social Worker: Ripley Fraise 07/20/2017  4:08 PM  Recreational Therapist: Winfield Cunas 07/20/2017  4:08 PM  Other: Norberto Sorenson 07/20/2017  4:08 PM  Other:  07/20/2017  4:08 PM    Scribe for Treatment Team:  Roque Lias [name] 07/20/2017 4:08 PM

## 2017-07-20 NOTE — Progress Notes (Addendum)
Recreation Therapy Notes  Date: 07/20/17 Time: 1000 Location: 500 Hall Dayroom  Group Topic: Self-Esteem  Goal Area(s) Addresses:  Patient will successfully identify positive attributes about themselves.  Patient will successfully identify benefit of improved self-esteem.   Behavioral Response: Engaged  Intervention: Pencils, paper  Activity: Doctor, hospital.  Patients were divided into two groups of 4.  Each person was to identify the positive traits they posses.  The group was to then create a story based on a superhero the created using the qualities they identified in themselves.  They were to include in the story how the superhero would use their positive traits to help others overcome their struggles such as depression, anxiety, addiction, etc.  Education:  Self-Esteem, Discharge Planning.   Education Outcome: Acknowledges education/In group clarification offered/Needs additional education  Clinical Observations/Feedback: Pt worked well with her group.  Pt and group identified some of their positive traits as intelligent, hardworking, loyal, strong, friendly, realistic, honest, resilient, open-minded and positive.  Pt was very bright and social.  Pt would sing a long with the music in the background.  Pt stated positive self esteem "keeps you movin' and groovin'".  Pt expressed this keeps you doing things and not getting down on yourself.  Pt went on to say our self esteem is influenced by the people around [place].   Caroll Rancher, LRT/CTRS         Caroll Rancher A 07/20/2017 12:33 PM
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
## 2017-07-20 NOTE — Progress Notes (Signed)
Adult Psychoeducational Group Note  Date:  07/20/2017 Time:  9:01 PM  Group Topic/Focus:  Wrap-Up Group:   The focus of this group is to help patients review their daily goal of treatment and discuss progress on daily workbooks.  Participation Level:  Active  Participation Quality:  Appropriate  Affect:  Appropriate  Cognitive:  Appropriate  Insight: Appropriate  Engagement in Group:  Engaged  Modes of Intervention:  Discussion  Additional Comments:  The patient expressed that she attended the positive and negative feelings  Group.The patient also said that she rates today a 10.  Octavio Manns 07/20/2017, 9:01 PM

## 2017-07-20 NOTE — B[facility] Group Notes (Signed)
[name] Group Therapy Note  07/20/2017 1:15pm  Type of Therapy/Topic:  Group Therapy:  Balance in Life  Participation Level:  Active  Description of Group:    This group will address the concept of balance and how it feels and looks when one is unbalanced. Patients will be encouraged to process areas in their lives that are out of balance and identify reasons for remaining unbalanced. Facilitators will guide patients in utilizing problem-solving interventions to address and correct the stressor making their life unbalanced. Understanding and applying boundaries will be explored and addressed for obtaining and maintaining a balanced life. Patients will be encouraged to explore ways to assertively make their unbalanced needs known to significant others in their lives, using other group members and facilitator for support and feedback.  Therapeutic Goals: 1. Patient will identify two or more emotions or situations they have that consume much of in their lives. 2. Patient will identify signs/triggers that life has become out of balance:  3. Patient will identify two ways to set boundaries in order to achieve balance in their lives:  4. Patient will demonstrate ability to communicate their needs through discussion and/or role plays  Summary of Patient Progress:   Brittany [name] attended group and was an active participant.  She was in and out of the room. She shared that when she becomes stressed or angry she goes for a drive around town.  She uses routines in her life to keep her balanced.  She states that she has never used meditation but that the deep breathing exercise used in group today was helpful.    Therapeutic Modalities:   Cognitive Behavioral Therapy Solution-Focused Therapy Assertiveness Training  [name], Student-Social Work 07/20/2017 1:26 PM
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
## 2017-07-21 MED ORDER — ARIPIPRAZOLE 10 MG PO TABS
10.0000 mg | ORAL_TABLET | Freq: Every day | ORAL | Status: DC
  Administered 2017-07-22: 10 mg via ORAL
  Filled 2017-07-21 (×3): qty 1

## 2017-07-21 MED ORDER — ARIPIPRAZOLE 5 MG PO TABS
5.0000 mg | ORAL_TABLET | Freq: Once | ORAL | Status: AC
  Administered 2017-07-21: 5 mg via ORAL
  Filled 2017-07-21: qty 1

## 2017-07-21 NOTE — Progress Notes (Signed)
   D:Pt laying in bed resting with eyes closed. Respirations even and unlabored. No distress noted. A: Monitor 1:1 for safety. R: Pt remains safe.   

## 2017-07-21 NOTE — Progress Notes (Signed)
[facility] MD Progress Note  07/21/2017 2:08 PM Brittany [name]  MRN:  [id number]   Subjective:  "I feel like I am ready to go. If I can get the Abilify injection then I will do better on kepping my meds in me."  Objective: Patient's chart and findings reviewed and discussed with treatment team. Patient is pleasant and cooperative. She becomes tearful when told she is not discharging today. She wants to go soon. She has agreed to increase the Abilify and she is requesting the Abilify Maintena injection for compliance and stability. Her speech is slightly pressured and loquacious today.  Principal Problem: Bipolar 1 disorder (HCC) Diagnosis:   Patient Active Problem List   Diagnosis Date Noted  . Bipolar 1 disorder (HCC) [F31.9] 07/18/2017  . Bipolar disorder, curr episode mixed, severe, with psychotic features (HCC) [F31.64] 06/08/2017  . ADHD (attention deficit hyperactivity disorder) [F90.9] 09/15/2016  . Borderline personality disorder (HCC) [F60.3] 05/04/2016  . Overdose [T50.901A] 05/03/2016  . Noncompliance [Z91.19] 05/03/2016   Total Time spent with patient: 25 minutes  Past Psychiatric History: See H&P  Past Medical History:  Past Medical History:  Diagnosis Date  . ADHD (attention deficit hyperactivity disorder)   . Anxiety   . Arthritis    right shoulder, bilateral knees  . Bipolar disorder (HCC)   . Bipolar I disorder, most recent episode depressed (HCC)   . Chronic kidney disease    Kidney stones  . Depression   . Headache     Past Surgical History:  Procedure Laterality Date  . CESAREAN SECTION    . CHOLECYSTECTOMY  2017  . FOOT SURGERY    . KIDNEY SURGERY    . NOVASURE ABLATION  2010  . SHOULDER SURGERY Right   . TUBAL LIGATION     Family History:  Family History  Problem Relation Age of Onset  . Anxiety disorder Mother   . Depression Mother   . Drug abuse Mother   . Bipolar disorder Mother   . Depression Father   . Anxiety disorder Father   . Drug  abuse Father   . Anxiety disorder Sister   . Depression Sister   . ADD / ADHD Sister   . Anxiety disorder Sister   . Depression Sister    Family Psychiatric  History: See H&P Social History:  History  Alcohol Use  . 2.4 - 7.2 oz/week  . 4 Glasses of wine per week    Comment: "couple weeks"     History  Drug Use  . Types: Marijuana    Comment: last used about 3 weeks ago    Social History   Social History  . Marital status: Married    Spouse name: N/A  . Number of children: N/A  . Years of education: N/A   Social History Main Topics  . Smoking status: Former Smoker    Types: E-cigarettes  . Smokeless tobacco: Never Used  . Alcohol use 2.4 - 7.2 oz/week    4 Glasses of wine per week     Comment: "couple weeks"  . Drug use: Yes    Types: Marijuana     Comment: last used about 3 weeks ago  . Sexual activity: Yes    Birth control/ protection: None   Other Topics Concern  . None   Social History Narrative  . None   Additional Social History:    Pain Medications: see PTA meds Prescriptions: see PTA meds Over the Counter: see PTA meds History of alcohol /  drug use?: Yes Longest period of sobriety (when/how long): 3 weeks Name of Substance 1: thc 1 - Age of First Use: 18 1 - Amount (size/oz): varies 1 - Frequency: once monthly 1 - Duration: ongoing  Name of Substance 2: etoh 2 - Age of First Use: 19 yrs 2 - Frequency: twice monthly 2 - Duration: ongoing                Sleep: Good  Appetite:  Good  Current Medications: Current Facility-Administered Medications  Medication Dose Route Frequency Provider Last Rate Last Dose  . alum & mag hydroxide-simeth (MAALOX/MYLANTA) 200-200-20 MG/5ML suspension 30 mL  30 mL Oral Q4H PRN Rankin, Shuvon B, NP      . [START ON 07/22/2017] ARIPiprazole (ABILIFY) tablet 10 mg  10 mg Oral Daily Money, Feliz Beam B, FNP      . furosemide (LASIX) tablet 20 mg  20 mg Oral Daily Rankin, Shuvon B, NP   20 mg at 07/21/17 0839   . hydrOXYzine (ATARAX/VISTARIL) tablet 25 mg  25 mg Oral Q6H PRN Oneta Rack, NP      . loperamide (IMODIUM) capsule 2-4 mg  2-4 mg Oral PRN Rankin, Shuvon B, NP      . loratadine (CLARITIN) tablet 10 mg  10 mg Oral Daily Rankin, Shuvon B, NP   10 mg at 07/21/17 0911  . LORazepam (ATIVAN) tablet 1 mg  1 mg Oral TID Oneta Rack, NP   1 mg at 07/21/17 1154   Followed by  . [START ON 07/22/2017] LORazepam (ATIVAN) tablet 1 mg  1 mg Oral BID Oneta Rack, NP       Followed by  . [START ON 07/23/2017] LORazepam (ATIVAN) tablet 1 mg  1 mg Oral Daily Lewis, Tanika N, NP      . magnesium hydroxide (MILK OF MAGNESIA) suspension 30 mL  30 mL Oral Daily PRN Rankin, Shuvon B, NP      . multivitamin with minerals tablet 1 tablet  1 tablet Oral Daily Oneta Rack, NP   1 tablet at 07/21/17 0839  . ondansetron (ZOFRAN-ODT) disintegrating tablet 4 mg  4 mg Oral Q6H PRN Rankin, Shuvon B, NP      . Oxcarbazepine (TRILEPTAL) tablet 300 mg  300 mg Oral BID Rankin, Shuvon B, NP   300 mg at 07/21/17 0839  . thiamine (B-1) injection 100 mg  100 mg Intramuscular Once Oneta Rack, NP      . thiamine (VITAMIN B-1) tablet 100 mg  100 mg Oral Daily Oneta Rack, NP   100 mg at 07/21/17 0839  . traZODone (DESYREL) tablet 150 mg  150 mg Oral QHS,MR X 1 Kerry Hough, PA-C   150 mg at 07/20/17 2147    Lab Results:  Results for orders placed or performed during the hospital encounter of 07/18/17 (from the past 48 hour(s))  Hepatic function panel     Status: Abnormal   Collection Time: 07/19/17  6:27 PM  Result Value Ref Range   Total Protein 7.3 6.5 - 8.1 g/dL   Albumin 4.1 3.5 - 5.0 g/dL   AST 21 15 - 41 U/L   ALT 24 14 - 54 U/L   Alkaline Phosphatase 63 38 - 126 U/L   Total Bilirubin 0.5 0.3 - 1.2 mg/dL   Bilirubin, Direct <7.8 (L) 0.1 - 0.5 mg/dL    Comment: REPEATED TO VERIFY   Indirect Bilirubin NOT CALCULATED 0.3 - 0.9 mg/dL  Comment: Performed at [facility], [address] [place], [place] [address]    Blood Alcohol level:  Lab Results  Component Value Date   ETH <5 05/03/2016    Metabolic Disorder Labs: Lab Results  Component Value Date   HGBA1C 5.0 06/10/2017   MPG 96.8 06/10/2017   MPG 105 09/16/2016   Lab Results  Component Value Date   PROLACTIN 57.7 (H) 06/10/2017   PROLACTIN 27.7 (H) 05/04/2016   Lab Results  Component Value Date   CHOL 189 06/10/2017   TRIG 175 (H) 06/10/2017   HDL 37 (L) 06/10/2017   CHOLHDL 5.1 06/10/2017   VLDL 35 06/10/2017   LDLCALC 117 (H) 06/10/2017   LDLCALC 176 (H) 09/16/2016    Physical Findings: AIMS: Facial and Oral Movements Muscles of Facial Expression: None, normal Lips and Perioral Area: None, normal Jaw: None, normal Tongue: None, normal,Extremity Movements Upper (arms, wrists, hands, fingers): None, normal Lower (legs, knees, ankles, toes): None, normal, Trunk Movements Neck, shoulders, hips: None, normal, Overall Severity Severity of abnormal movements (highest score from questions above): None, normal Incapacitation due to abnormal movements: None, normal Patient's awareness of abnormal movements (rate only patient's report): No Awareness, Dental Status Current problems with teeth and/or dentures?: No Does patient usually wear dentures?: No  CIWA:  CIWA-Ar Total: 0 COWS:     Musculoskeletal: Strength & Muscle Tone: within normal limits Gait & Station: normal Patient leans: N/A  Psychiatric Specialty Exam: Physical Exam  Nursing note and vitals reviewed. Constitutional: She is oriented to person, place, and time. She appears well-developed and well-nourished.  Cardiovascular: Normal rate.   Respiratory: Effort normal.  Musculoskeletal: Normal range of motion.  Neurological: She is alert and oriented to person, place, and time.  Skin: Skin is warm.    Review of Systems  Constitutional: Negative.   HENT: Negative.   Eyes: Negative.   Respiratory: Negative.    Cardiovascular: Negative.   Gastrointestinal: Negative.   Genitourinary: Negative.   Musculoskeletal: Negative.   Skin: Negative.   Endo/Heme/Allergies: Negative.   Psychiatric/Behavioral: Negative for depression, hallucinations and suicidal ideas. The patient is not nervous/anxious.     Blood pressure 105/61, pulse 91, temperature 98 F (36.7 C), temperature source Oral, resp. rate 18, height  (1.626 m), weight 103 kg (227 lb).Body mass index is 38.96 kg/m.  General Appearance: Casual  Eye Contact:  Good  Speech:  Clear and Coherent and Pressured  Volume:  Normal  Mood:  Anxious  Affect:  Full Range  Thought Process:  Goal Directed and Descriptions of Associations: Intact  Orientation:  Full (Time, Place, and Person)  Thought Content:  WDL  Suicidal Thoughts:  No  Homicidal Thoughts:  No  Memory:  Immediate;   Good Recent;   Good Remote;   Good  Judgement:  Fair  Insight:  Fair  Psychomotor Activity:  Normal  Concentration:  Concentration: Good and Attention Span: Good  Recall:  Good  Fund of Knowledge:  Good  Language:  Good  Akathisia:  No  Handed:  Right  AIMS (if indicated):     Assets:  Communication Skills Desire for Improvement Financial Resources/Insurance Housing Physical Health Social Support Transportation  ADL's:  Intact  Cognition:  WNL  Sleep:  Number of Hours: 6.75     Treatment Plan Summary: Daily contact with patient to assess and evaluate symptoms and progress in treatment, Medication management and Plan is to:  -Increase Abilify 10 mg PO Daily for mood stability -  Abilify Maintena 400 mg IM tomorrow  -Continue Vistaril 25 mg PO Q6H PRN for anxiety -Continue Ativan CIWA protocol -Continue Trileptal 300 mg PO BID for withdrawal symptoms -Continue Trazodone 150 mg PO QHS PRN for insomnia -Encourage group therapy participation -Possible discharge tomorrow   Maryfrances Bunnell, FNP 07/21/2017, 2:08 PM   I agree with NP Progress Note
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
## 2017-07-21 NOTE — Progress Notes (Signed)
Did not attend group 

## 2017-07-21 NOTE — Progress Notes (Signed)
Nursing Progress Note: 7p-7a D: Pt currently presents with a sad/brighten on approach affect and behavior. Pt states "I am not gonna be able to take a pill to make me not feel the distance between me and my child. I think it is too soon to tell if these meds are actually working. I feel good. Like I don't feel sad unless of course I think of my baby." Interacting appropriately with the milieu. Pt reports off and on sleep during the previous night with current medication regimen. Pt did not attend wrap-up group.  A: Pt provided with medications per providers orders. Pt's labs and vitals were monitored throughout the night. Pt supported emotionally and encouraged to express concerns and questions. Pt educated on medications.  R: Pt's safety ensured with 15 minute and environmental checks. Pt currently denies SI, HI, and AVH. Pt verbally contracts to seek staff if SI,HI, or AVH occurs and to consult with staff before acting on any harmful thoughts. Will continue to monitor.

## 2017-07-21 NOTE — Progress Notes (Signed)
Recreation Therapy Notes  Date: 07/21/17 Time: 1000 Location: 500 Hall Dayroom  Group Topic: Communication, Team Building, Problem Solving  Goal Area(s) Addresses:  Patient will effectively work with peer towards shared goal.  Patient will identify skill used to make activity successful.  Patient will identify how skills used during activity can be used to reach post d/c goals.   Behavioral Response: Minimal  Intervention: STEM Activity   Activity: Straw Bridge. In teams, patients were asked to build a straw bridge that could hold a small puzzle box.  Each group was given 20 straws and 2 ft of masking tape to complete the project.    Education: Pharmacist, community, Building control surveyor.   Education Outcome: Acknowledges education/In group clarification offered/Needs additional education.   Clinical Observations/Feedback: Pt mostly observed.  Pt gave some suggestions.  Pt was pleasant, bright and social during group.    Caroll Rancher, LRT/CTRS         Caroll Rancher A 07/21/2017 11:57 AM

## 2017-07-21 NOTE — Progress Notes (Signed)
1:1 Note: Patient is maintained on constant supervision for safety.  Patient is calm and pleasant.  Visible in the dayroom interacting with peers and staff.  Attended group and participated.  No behavioral issues noted or reported.  Patient is safe on the unit with supervision.

## 2017-07-21 NOTE — BHH Group Notes (Signed)
[name] Group Therapy 07/21/2017 1:15pm  Type of Therapy and Topic:  Group Therapy:  Change and Accountability  Participation Level:  Minimal  Description of Group In this group, patients discussed power and accountability for change.  The group identified the challenges related to accountability and the difficulty of accepting the outcomes of negative behaviors.  Patients were encouraged to openly discuss a challenge/change they could take responsibility for.  Patients discussed the use of "change talk" and positive thinking as ways to support achievement of personal goals.  The group discussed ways to give support and empowerment to peers.  Therapeutic Goals: 1. Patients will state the relationship between personal power and accountability in the change process 2. Patients will identify the positive and negative consequences of a personal choice they have made 3. Patients will identify one challenge/choice they will take responsibility for making 4. Patients will discuss the role of "change talk" and the impact of positive thinking as it supports successful personal change 5. Patients will verbalize support and affirmation of change efforts in peers  Summary of Patient Progress:  Brittany [name] attended group but she was in and out of the room.  She was an active participant while she was in the group.  She shared that she has learned that she needs to take her medication in order to get better.   Therapeutic Modalities Solution Focused Brief Therapy Motivational Interviewing Cognitive Behavioral Therapy  Brittany [name] Work 07/21/2017 1:14 PM

## 2017-07-21 NOTE — Progress Notes (Signed)
1:1 Note: Patient maintained on constant supervision for safety.  Patient is calm and appropriate on the unit.  Patient in the dayroom interacting with staff and peers.  No behavioral issues noted.  Routine safety checks continues.  Medication given as prescribed.  Patient is safe on the unit with supervision.

## 2017-07-21 NOTE — Progress Notes (Signed)
DAR NOTE: Patient presents with anxious affect and pleasant mood.  Denies pain, auditory and visual hallucinations.  Rates depression at 0, hopelessness at 0, and anxiety at 4.  Maintained on routine safety checks.  Medications given as prescribed.  Support and encouragement offered as needed.  Attended group and participated.  States goal for today is "maintaining my goals."  Patient observed socializing with peers in the dayroom.  Offered no complaint.

## 2017-07-22 DIAGNOSIS — F1210 Cannabis abuse, uncomplicated: Secondary | ICD-10-CM

## 2017-07-22 LAB — PROLACTIN: Prolactin: 25.3 ng/mL — ABNORMAL HIGH (ref 4.8–23.3)

## 2017-07-22 MED ORDER — OXCARBAZEPINE 300 MG PO TABS: 300.0000 mg | ORAL_TABLET | Freq: Two times a day (BID) | ORAL | 0 refills | Status: DC

## 2017-07-22 MED ORDER — ARIPIPRAZOLE 10 MG PO TABS: 10.0000 mg | ORAL_TABLET | Freq: Every day | ORAL | 0 refills | Status: DC

## 2017-07-22 MED ORDER — FUROSEMIDE 20 MG PO TABS: 20.0000 mg | ORAL_TABLET | Freq: Every day | ORAL | 0 refills | Status: DC

## 2017-07-22 MED ORDER — ARIPIPRAZOLE ER 400 MG IM SRER: 400.0000 mg | INTRAMUSCULAR | 0 refills | Status: DC

## 2017-07-22 MED ORDER — HYDROXYZINE HCL 25 MG PO TABS: 25.0000 mg | ORAL_TABLET | Freq: Four times a day (QID) | ORAL | 0 refills | Status: DC | PRN

## 2017-07-22 MED ORDER — ARIPIPRAZOLE ER 400 MG IM SRER
400.0000 mg | INTRAMUSCULAR | Status: DC
  Administered 2017-07-22: 400 mg via INTRAMUSCULAR

## 2017-07-22 MED ORDER — TRAZODONE HCL 150 MG PO TABS: 150.0000 mg | ORAL_TABLET | Freq: Every evening | ORAL | 0 refills | Status: DC | PRN

## 2017-07-22 NOTE — Progress Notes (Signed)
Recreation Therapy Notes  INPATIENT RECREATION TR PLAN  Patient Details Name: Brittany [name] MRN: [id number] DOB: 1985-12-28 Today's Date: 07/22/2017  Rec Therapy Plan Is patient appropriate for Therapeutic Recreation?: Yes Treatment times per week: about 3 days Estimated Length of Stay: 5-7 days TR Treatment/Interventions: Group participation (Comment)  Discharge Criteria Pt will be discharged from therapy if:: Discharged Treatment plan/goals/alternatives discussed and agreed upon by:: Patient/family  Discharge Summary Short term goals set: Patient will attend and participate in Recreation Therapy Group Sessions  Short term goals met: Complete Progress toward goals comments: Groups attended Which groups?: Self-esteem, Wellness, Communication, Other (Comment) (Communication) Reason goals not met: None Therapeutic equipment acquired: N/A Reason patient discharged from therapy: Discharge from hospital Pt/family agrees with progress & goals achieved: Yes Date patient discharged from therapy: 07/22/17   Victorino Sparrow, LRT/CTRS  Ria Comment, Seniah Lawrence A 07/22/2017, 11:33 AM

## 2017-07-22 NOTE — Progress Notes (Signed)
1:1 Progress Note D: Pt currently in bed awake. Patient appropriate to situation. Pt in no current distress.  A: Sitter is currently at bedside. R: Pt remains safe on a 1:1 per MD orders.    

## 2017-07-22 NOTE — Progress Notes (Signed)
1:1 Progress Note D: Pt currently in bed asleep. Patient appropriate to situation. Pt in no current distress.  A: Sitter is currently at bedside. R: Pt remains safe on a 1:1 per MD orders.   

## 2017-07-22 NOTE — Progress Notes (Signed)
Patient discharged to lobby. Patient was stable and appreciative at that time. All papers and prescriptions were given and valuables returned. Verbal understanding expressed. Denies SI/HI and A/VH. Patient given opportunity to express concerns and ask questions.  Significant other made aware of discharge.  Significant otherverbalizes  patient readiness for discharge.

## 2017-07-22 NOTE — BHH Suicide Risk Assessment (Signed)
[facility] Discharge Suicide Risk Assessment   Principal Problem: Bipolar 1 disorder [facility]) Discharge Diagnoses:  Patient Active Problem List   Diagnosis Date Noted  . Bipolar 1 disorder ([place]) [F31.9] 07/18/2017  . Bipolar disorder, curr episode mixed, severe, with psychotic features ([place]) [F31.64] 06/08/2017  . ADHD (attention deficit hyperactivity disorder) [F90.9] 09/15/2016  . Borderline personality disorder ([place]) [F60.3] 05/04/2016  . Overdose [T50.901A] 05/03/2016  . Noncompliance [Z91.19] 05/03/2016    Total Time spent with patient: 30 minutes  Musculoskeletal: Strength & Muscle Tone: within normal limits Gait & Station: normal Patient leans: N/A  Psychiatric Specialty Exam: Review of Systems  Constitutional: Negative.   HENT: Negative.   Eyes: Negative.   Respiratory: Negative.   Cardiovascular: Negative.   Gastrointestinal: Negative.   Genitourinary: Negative.   Musculoskeletal: Negative.   Skin: Negative.   Neurological: Negative.   Endo/Heme/Allergies: Negative.   Psychiatric/Behavioral: Negative for depression, hallucinations, memory loss, substance abuse and suicidal ideas. The patient is not nervous/anxious and does not have insomnia.     Blood pressure 113/80, pulse (!) 115, temperature 97.8 F (36.6 C), temperature source Oral, resp. rate 16, height '5\' 4"'  (1.626 m), weight 103 kg (227 lb).Body mass index is 38.96 kg/m.  General Appearance: Neatly dressed, pleasant, engaging well and cooperative. Appropriate behavior. Not in any distress. Good relatedness. Not internally stimulated.  Eye Contact::  Good  Speech:  Slightly pressured   Volume:  Normal  Mood:  Euthymic  Affect:  Appropriate and Full Range  Thought Process:  Linear  Orientation:  Full (Time, Place, and Person)  Thought Content:  No delusional theme. No preoccupation with violent thoughts. No negative ruminations. No obsession.  No hallucination in any modality.   Suicidal Thoughts:  No  Homicidal  Thoughts:  No  Memory:  Immediate;   Good Recent;   Good Remote;   Good  Judgement:  Good  Insight:  Good  Psychomotor Activity:  Normal  Concentration:  Good  Recall:  Good  Fund of Knowledge:Good  Language: Good  Akathisia:  Negative  Handed:    AIMS (if indicated):     Assets:  Communication Skills Desire for Improvement Housing Intimacy Resilience Social Support Transportation  Sleep:  Number of Hours: 6.75  Cognition: WNL  ADL's:  Intact   Clinical Assessment::   31 y.o Caucasian female, married, lives with her family. Background history of Bipolar Disorder and Borderline PD. Recently discharged from our unit. Had been admitted elsewhere since then. Represented because she has been manic. She has been having suicidal thoughts. She reported taking an overdose recently. Felt her medications were not working. Patient was observed to be hyperactive and dis-inhibited. She was put on 1:1 as a peer was taking advantage of her manic state. Patient has been weaned off antidepressants and now on two mood stabilizers. She has taken LAI of one of her mood stabilizer. Her family has been visiting her regularly. Her husband feels she is doing well and came in to take her home today.  Chart reviewed today. Patient discussed at team today. Team feels that she has improved and approves discharge.  I met with her for the first time today. Says she feels better. Her thoughts are not as crowded as they used to be. Says she is now able to think clearly. Her mood is stabilizing well. Says she is no longer feeling as irritated as she was when she came in. She is no loner feeling depressed. No thoughts of suicide. Says she just  wants to be back with her family. She is now sleeping well. No side effects from her medications.  No hallucination in any modality. He is not making any delusional statement. No passivity of will/thought. She is fully in touch with reality. No thoughts of homicide. No violent  thoughts. No overwhelming anxiety. No access to weapons. No craving for substances.  I spoke with her husband Sofie Schendel. He has been visiting her regularly. Says he feels she is back to her baseline. Says she has not expressed any suicidal thoughts lately. He does not feel she is a danger to herself or others. He wants to take her back home today. Reaffirmed that there are no weapons in the house.   Nursing staff reports that patient has been appropriate on the unit. Patient has been interacting well with peers. No behavioral issues. Patient has not voiced any suicidal thoughts. Patient has not been observed to be internally stimulated. Patient has been adherent with treatment recommendations. Patient has been tolerating their medication well.      Demographic Factors:  NA  Loss Factors: NA  Historical Factors: Prior suicide attempts and Impulsivity  Risk Reduction Factors:   Responsible for children under 21 years of age, Sense of responsibility to family, Living with another person, especially a relative, Positive social support, Positive therapeutic relationship and Positive coping skills or problem solving skills  Continued Clinical Symptoms:  As above   Cognitive Features That Contribute To Risk:  None    Suicide Risk:  Minimal: No identifiable suicidal ideation.  Patient is not having any thoughts of suicide at this time. Modifiable risk factors targeted during is mixed manic episode. Demographical and historical risk factors cannot be modified. Patient is now engaging well. Patient is reliable and is future oriented. We have buffered patient's support structures. At this point, patient is at low risk of suicide. Patient is aware of the effects of psychoactive substances on decision making process. Patient has been provided with emergency contacts. Patient acknowledges to use resources provided if unforseen circumstances changes their current risk stratification.    Follow-up  Information    [facility] Follow up on 07/25/2017.   Specialty:  Family Medicine Why:  Monday, October 15th at 2pm with Dr.  Then, 3PM with your therapist.  These are both in [place] at [place] information: [address] [place] [address] [phone]           Plan Of Care/Follow-up recommendations:  1. Continue current psychotropic medications 2. Mental health and addiction follow up as arranged.  3. Discharge in care of her husband 4. Provided limited quantity of prescriptions   Artist [place], MD 07/22/2017, 10:50 AM

## 2017-07-22 NOTE — [facility] Suicide Risk Assessment (Signed)
[facility] INPATIENT:  Family/Significant Other Suicide Prevention Education  Suicide Prevention Education:  Education Completed;Husband, Maryon Kemnitz [phone]  has been identified by the patient as the family member/significant other with whom the patient will be residing, and identified as the person(s) who will aid the patient in the event of a mental health crisis (suicidal ideations/suicide attempt).  With written consent from the patient, the family member/significant other has been provided the following suicide prevention education, prior to the and/or following the discharge of the patient.  The suicide prevention education provided includes the following:  Suicide risk factors  Suicide prevention and interventions  National Suicide Hotline telephone number  [facility] assessment telephone number  [facility] Emergency Assistance 911  [facility] and/or Residential Mobile Crisis Unit telephone number  Request made of family/significant other to:  Remove weapons (e.g., guns, rifles, knives), all items previously/currently identified as safety concern.    Remove drugs/medications (over-the-counter, prescriptions, illicit drugs), all items previously/currently identified as a safety concern.  The family member/significant other verbalizes understanding of the suicide prevention education information provided.  The family member/significant other agrees to remove the items of safety concern listed above.  Ida [name] 07/22/2017, 9:25 AM

## 2017-07-22 NOTE — Plan of Care (Signed)
Problem: [facility] Participation in Recreation Therapeutic Interventions Goal: STG-Patient will attend/participate in Rec Therapy Group Ses STG-The Patient will attend and participate in Recreation Therapy Group Sessions  Outcome: Completed/Met Date Met: 07/22/17 Patient attended and participated in communication, team building, self-esteem and wellness recreation therapy sessions.  Victorino Sparrow, LRT/CTRS

## 2017-07-22 NOTE — Progress Notes (Signed)
1:1 Note: Patient maintained on constant supervision for safety.  Patient is calm and pleasant.  Denies suicidal thoughts, auditory and visual hallucinations.  Medications given as prescribed.  Routine safety checks maintained.  Support and encouragement offered as needed.  Patient attended group and participated.  Patient is safe on the unit.

## 2017-07-22 NOTE — Progress Notes (Signed)
1:1 Progress Note D: Pt currently in bed awake. Patient appropriate to situation. Pt in no current distress.  A: Sitter is currently sitting at bedside. R: Pt remains safe on a 1:1 per MD orders.

## 2017-07-22 NOTE — Progress Notes (Signed)
Recreation Therapy Notes  Date: 07/22/17 Time: 1015 Location: 500 Hall Dayroom  Group Topic: Communication  Goal Area(s) Addresses:  Patient will effectively communicate with peers in group.  Patient will verbalize benefit of healthy communication. Patient will verbalize positive effect of healthy communication on post d/c goals.  Patient will identify communication techniques that made activity effective for group.   Behavioral Response: Engaged  Intervention:  [employer], writing utensils, blank paper  Activity:  Back to Back Drawings.  Patients were seated in pairs back to back.  One patient would give directions and the other patient would listen.  The patients doing the talking were given a geometrical picture which they were to give instructions for their partner to draw.  The patients listening where to draw the pictures being described.  Patients would switch roles on the second round.  Education: Communication, Discharge Planning  Education Outcome: Acknowledges understanding/In group clarification offered/Needs additional education.   Clinical Observations/Feedback: Pt was bright and engaged during activity.  Pt explained the hardest part of being the listener was not being able to ask questions.  Pt left to speak with nurse but returned.  Pt expressed in regular conversation to keep misunderstandings from happening she would "have them repeat back to me what I said and make sure I give clear instructions".  Pt also stated the tone in which you speak can cause misunderstandings.  Caroll Rancher, LRT/CTRS         Lillia Abed, Gitel Beste A 07/22/2017 11:05 AM
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
## 2017-07-22 NOTE — Progress Notes (Signed)
  [facility] Adult Case Management Discharge Plan :  Will you be returning to the same living situation after discharge:  Yes,  home At discharge, do you have transportation home?: Yes,  husband Do you have the ability to pay for your medications: Yes,  MCD  Release of information consent forms completed and in the chart;  Patient's signature needed at discharge.  Patient to Follow up at: Follow-up Information    [facility], [name] Follow up on 07/25/2017.   Specialty:  Family Medicine Why:  Monday, October 15th at 2pm with Dr.  Then, 3PM with your therapist.  These are both in [place] at [address] [facility] Contact information: [address]any [name] [address] [place] Brittany [name] [address] [phone]           Next level of care provider has access to [facility] Link:no  Safety Planning and Suicide Prevention discussed: Yes,  yes  Have you used any form of tobacco in the last 30 days? (Cigarettes, Smokeless Tobacco, Cigars, and/or Pipes): Yes  Has patient been referred to the Quitline?: Patient refused referral  Patient has been referred for addiction treatment: N/A  Brittany [name], [name] 07/22/2017, 10:26 AM

## 2017-10-23 ENCOUNTER — Inpatient Hospital Stay (HOSPITAL_COMMUNITY)
Admission: RE | Admit: 2017-10-23 | Discharge: 2017-10-27 | DRG: 883 | Disposition: A | Discharge status: Home / Self Care | Payer: Medicaid Other | Attending: Psychiatry | Admitting: Psychiatry

## 2017-10-23 DIAGNOSIS — Z915 Personal history of self-harm: Secondary | ICD-10-CM

## 2017-10-23 DIAGNOSIS — G4700 Insomnia, unspecified: Secondary | ICD-10-CM | POA: Diagnosis present

## 2017-10-23 DIAGNOSIS — F909 Attention-deficit hyperactivity disorder, unspecified type: Secondary | ICD-10-CM | POA: Diagnosis present

## 2017-10-23 DIAGNOSIS — D72829 Elevated white blood cell count, unspecified: Secondary | ICD-10-CM | POA: Diagnosis present

## 2017-10-23 DIAGNOSIS — F314 Bipolar disorder, current episode depressed, severe, without psychotic features: Secondary | ICD-10-CM | POA: Diagnosis present

## 2017-10-23 DIAGNOSIS — M170 Bilateral primary osteoarthritis of knee: Secondary | ICD-10-CM | POA: Diagnosis present

## 2017-10-23 DIAGNOSIS — R002 Palpitations: Secondary | ICD-10-CM | POA: Diagnosis not present

## 2017-10-23 DIAGNOSIS — Z87442 Personal history of urinary calculi: Secondary | ICD-10-CM

## 2017-10-23 DIAGNOSIS — Z9049 Acquired absence of other specified parts of digestive tract: Secondary | ICD-10-CM | POA: Diagnosis not present

## 2017-10-23 DIAGNOSIS — R45851 Suicidal ideations: Secondary | ICD-10-CM | POA: Diagnosis present

## 2017-10-23 DIAGNOSIS — Z818 Family history of other mental and behavioral disorders: Secondary | ICD-10-CM | POA: Diagnosis not present

## 2017-10-23 DIAGNOSIS — M19011 Primary osteoarthritis, right shoulder: Secondary | ICD-10-CM | POA: Diagnosis present

## 2017-10-23 DIAGNOSIS — R51 Headache: Secondary | ICD-10-CM | POA: Diagnosis not present

## 2017-10-23 DIAGNOSIS — Z62819 Personal history of unspecified abuse in childhood: Secondary | ICD-10-CM | POA: Diagnosis not present

## 2017-10-23 DIAGNOSIS — Z79899 Other long term (current) drug therapy: Secondary | ICD-10-CM | POA: Diagnosis not present

## 2017-10-23 DIAGNOSIS — F1220 Cannabis dependence, uncomplicated: Secondary | ICD-10-CM | POA: Diagnosis not present

## 2017-10-23 DIAGNOSIS — F603 Borderline personality disorder: Principal | ICD-10-CM | POA: Diagnosis present

## 2017-10-23 DIAGNOSIS — F1210 Cannabis abuse, uncomplicated: Secondary | ICD-10-CM | POA: Diagnosis not present

## 2017-10-23 DIAGNOSIS — Z813 Family history of other psychoactive substance abuse and dependence: Secondary | ICD-10-CM | POA: Diagnosis not present

## 2017-10-23 DIAGNOSIS — Z87891 Personal history of nicotine dependence: Secondary | ICD-10-CM | POA: Diagnosis not present

## 2017-10-23 DIAGNOSIS — F419 Anxiety disorder, unspecified: Secondary | ICD-10-CM | POA: Diagnosis present

## 2017-10-23 DIAGNOSIS — F4001 Agoraphobia with panic disorder: Secondary | ICD-10-CM | POA: Diagnosis not present

## 2017-10-23 MED ORDER — TRAZODONE HCL 150 MG PO TABS
300.0000 mg | ORAL_TABLET | Freq: Every evening | ORAL | Status: DC | PRN
  Administered 2017-10-23: 300 mg via ORAL
  Filled 2017-10-23: qty 2

## 2017-10-23 MED ORDER — HYDROXYZINE HCL 25 MG PO TABS
25.0000 mg | ORAL_TABLET | Freq: Three times a day (TID) | ORAL | Status: DC | PRN
Start: 2017-10-23 — End: 2017-10-28
  Administered 2017-10-25 – 2017-10-27 (×4): 25 mg via ORAL
  Filled 2017-10-23 (×4): qty 1

## 2017-10-23 MED ORDER — LORAZEPAM 1 MG PO TABS: 1.0000 mg | ORAL_TABLET | Freq: Every day | ORAL | Status: DC

## 2017-10-23 MED ORDER — ALUM & MAG HYDROXIDE-SIMETH 200-200-20 MG/5ML PO SUSP: 30.0000 mL | ORAL | Status: DC | PRN

## 2017-10-23 MED ORDER — MAGNESIUM HYDROXIDE 400 MG/5ML PO SUSP: 30.0000 mL | Freq: Every day | ORAL | Status: DC | PRN

## 2017-10-23 MED ORDER — LORAZEPAM 1 MG PO TABS
1.0000 mg | ORAL_TABLET | Freq: Four times a day (QID) | ORAL | Status: AC
  Administered 2017-10-23 – 2017-10-25 (×6): 1 mg via ORAL
  Filled 2017-10-23 (×6): qty 1

## 2017-10-23 MED ORDER — LORAZEPAM 1 MG PO TABS: 1.0000 mg | ORAL_TABLET | Freq: Two times a day (BID) | ORAL | Status: DC

## 2017-10-23 MED ORDER — LORAZEPAM 1 MG PO TABS
1.0000 mg | ORAL_TABLET | Freq: Three times a day (TID) | ORAL | Status: DC
  Administered 2017-10-25 (×2): 1 mg via ORAL
  Filled 2017-10-23 (×2): qty 1

## 2017-10-23 MED ORDER — ACETAMINOPHEN 325 MG PO TABS
650.0000 mg | ORAL_TABLET | Freq: Four times a day (QID) | ORAL | Status: DC | PRN
  Administered 2017-10-25: 650 mg via ORAL
  Filled 2017-10-23: qty 2

## 2017-10-23 NOTE — H&P (Signed)
Behavioral Health Medical Screening Exam  Brittany FasterChristine H Mcclatchy is an 32 y.o. female.  Total Time spent with patient: 15 minutes  Psychiatric Specialty Exam: Physical Exam  Constitutional: She is oriented to person, place, and time. She appears well-developed and well-nourished. No distress.  HENT:  Head: Normocephalic and atraumatic.  Right Ear: External ear normal.  Left Ear: External ear normal.  Eyes: Conjunctivae are normal. Right eye exhibits no discharge. Left eye exhibits no discharge.  Cardiovascular: Normal rate, regular rhythm and normal heart sounds.  Respiratory: Effort normal and breath sounds normal. No respiratory distress.  Neurological: She is alert and oriented to person, place, and time.  Skin: Skin is warm and dry. She is not diaphoretic.  Psychiatric: Her speech is normal. Her mood appears anxious. She is not withdrawn and not actively hallucinating. Thought content is not paranoid and not delusional. Cognition and memory are normal. She expresses impulsivity and inappropriate judgment. She exhibits a depressed mood. She expresses suicidal ideation. She expresses no homicidal ideation. She expresses suicidal plans.    Review of Systems  Constitutional: Negative for chills and fever.  Respiratory: Negative for cough, shortness of breath and wheezing.   Cardiovascular: Negative for chest pain and palpitations.  Psychiatric/Behavioral: Positive for depression, hallucinations and suicidal ideas. Negative for memory loss. The patient is nervous/anxious and has insomnia.   All other systems reviewed and are negative.   Blood pressure 128/90, pulse (!) 102, temperature 98.6 F (37 C), resp. rate 16, SpO2 100 %.There is no height or weight on file to calculate BMI.  General Appearance: Casual and Fairly Groomed  Eye Contact:  Good  Speech:  Clear and Coherent and Normal Rate  Volume:  Normal  Mood:  Anxious, Depressed, Dysphoric, Hopeless and Worthless  Affect:  Congruent  and Depressed  Thought Process:  Coherent and Descriptions of Associations: Intact  Orientation:  Full (Time, Place, and Person)  Thought Content:  Logical and Hallucinations: None  Suicidal Thoughts:  Yes.  with intent/plan  Homicidal Thoughts:  No  Memory:  Immediate;   Good Recent;   Good  Judgement:  Fair  Insight:  Fair  Psychomotor Activity:  Normal  Concentration: Concentration: Fair and Attention Span: Fair  Recall:  Good  Fund of Knowledge:Good  Language: Good  Akathisia:  No  Handed:  Right  AIMS (if indicated):     Assets:  Communication Skills Desire for Improvement Financial Resources/Insurance Housing Intimacy Leisure Time Physical Health Transportation  Sleep:       Musculoskeletal: Strength & Muscle Tone: within normal limits Gait & Station: normal Patient leans: N/A  Blood pressure 128/90, pulse (!) 102, temperature 98.6 F (37 C), resp. rate 16, SpO2 100 %.  Recommendations:  Based on my evaluation the patient does not appear to have an emergency medical condition.  Jackelyn PolingJason A Bowe Sidor, NP 10/23/2017, 10:54 PM
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
## 2017-10-24 ENCOUNTER — Other Ambulatory Visit: Payer: Self-pay

## 2017-10-24 ENCOUNTER — Encounter (HOSPITAL_COMMUNITY): Payer: Self-pay

## 2017-10-24 DIAGNOSIS — F4001 Agoraphobia with panic disorder: Secondary | ICD-10-CM

## 2017-10-24 DIAGNOSIS — Z818 Family history of other mental and behavioral disorders: Secondary | ICD-10-CM

## 2017-10-24 DIAGNOSIS — F314 Bipolar disorder, current episode depressed, severe, without psychotic features: Secondary | ICD-10-CM

## 2017-10-24 DIAGNOSIS — R002 Palpitations: Secondary | ICD-10-CM

## 2017-10-24 DIAGNOSIS — Z813 Family history of other psychoactive substance abuse and dependence: Secondary | ICD-10-CM

## 2017-10-24 DIAGNOSIS — R51 Headache: Secondary | ICD-10-CM

## 2017-10-24 DIAGNOSIS — Z62819 Personal history of unspecified abuse in childhood: Secondary | ICD-10-CM

## 2017-10-24 DIAGNOSIS — R45851 Suicidal ideations: Secondary | ICD-10-CM

## 2017-10-24 DIAGNOSIS — F1220 Cannabis dependence, uncomplicated: Secondary | ICD-10-CM

## 2017-10-24 LAB — COMPREHENSIVE METABOLIC PANEL
ALT: 19 U/L (ref 14–54)
AST: 20 U/L (ref 15–41)
Albumin: 4 g/dL (ref 3.5–5.0)
Alkaline Phosphatase: 60 U/L (ref 38–126)
Anion gap: 11 (ref 5–15)
BUN: 21 mg/dL — ABNORMAL HIGH (ref 6–20)
CHLORIDE: 104 mmol/L (ref 101–111)
CO2: 22 mmol/L (ref 22–32)
Calcium: 9.4 mg/dL (ref 8.9–10.3)
Creatinine, Ser: 0.74 mg/dL (ref 0.44–1.00)
Glucose, Bld: 104 mg/dL — ABNORMAL HIGH (ref 65–99)
POTASSIUM: 3.9 mmol/L (ref 3.5–5.1)
Sodium: 137 mmol/L (ref 135–145)
Total Bilirubin: 0.6 mg/dL (ref 0.3–1.2)
Total Protein: 7.7 g/dL (ref 6.5–8.1)

## 2017-10-24 LAB — TSH: TSH: 3.045 u[IU]/mL (ref 0.350–4.500)

## 2017-10-24 LAB — HEMOGLOBIN A1C
HEMOGLOBIN A1C: 4.7 % — AB (ref 4.8–5.6)
Mean Plasma Glucose: 88.19 mg/dL

## 2017-10-24 LAB — RAPID URINE DRUG SCREEN, HOSP PERFORMED
Amphetamines: NOT DETECTED
BARBITURATES: NOT DETECTED
Benzodiazepines: POSITIVE — AB
COCAINE: NOT DETECTED
Opiates: NOT DETECTED
TETRAHYDROCANNABINOL: POSITIVE — AB

## 2017-10-24 LAB — PREGNANCY, URINE: Preg Test, Ur: NEGATIVE

## 2017-10-24 LAB — LIPID PANEL
Cholesterol: 290 mg/dL — ABNORMAL HIGH (ref 0–200)
HDL: 56 mg/dL (ref >40)
LDL Cholesterol: 203 mg/dL — ABNORMAL HIGH (ref 0–99)
TRIGLYCERIDES: 155 mg/dL — AB (ref <150)
Total CHOL/HDL Ratio: 5.2 RATIO
VLDL: 31 mg/dL (ref 0–40)

## 2017-10-24 LAB — CBC
HCT: 41.7 % (ref 36.0–46.0)
Hemoglobin: 13.9 g/dL (ref 12.0–15.0)
MCH: 31 pg (ref 26.0–34.0)
MCHC: 33.3 g/dL (ref 30.0–36.0)
MCV: 92.9 fL (ref 78.0–100.0)
PLATELETS: 223 10*3/uL (ref 150–400)
RBC: 4.49 MIL/uL (ref 3.87–5.11)
RDW: 13.8 % (ref 11.5–15.5)
WBC: 10.6 10*3/uL — ABNORMAL HIGH (ref 4.0–10.5)

## 2017-10-24 MED ORDER — ZIPRASIDONE HCL 20 MG PO CAPS
20.0000 mg | ORAL_CAPSULE | Freq: Two times a day (BID) | ORAL | Status: DC
  Administered 2017-10-25 – 2017-10-27 (×6): 20 mg via ORAL
  Filled 2017-10-24 (×10): qty 1

## 2017-10-24 MED ORDER — TRAZODONE HCL 100 MG PO TABS
100.0000 mg | ORAL_TABLET | Freq: Every evening | ORAL | Status: DC | PRN
  Administered 2017-10-24 (×2): 100 mg via ORAL
  Filled 2017-10-24 (×2): qty 1

## 2017-10-24 NOTE — H&P (Deleted)
Psychiatric Admission Assessment Adult  Patient Identification: Brittany [name] MRN:  [id number] Date of Evaluation:  10/24/2017 Chief Complaint:  BIPOLAR DISORDER,DEPRESSED Principal Diagnosis: Bipolar 1 disorder, depressed, severe (HCC) Diagnosis:   Patient Active Problem List   Diagnosis Date Noted  . Bipolar 1 disorder, depressed, severe (HCC) [F31.4] 10/23/2017  . Bipolar 1 disorder (HCC) [F31.9] 07/18/2017  . Bipolar disorder, curr episode mixed, severe, with psychotic features (HCC) [F31.64] 06/08/2017  . ADHD (attention deficit hyperactivity disorder) [F90.9] 09/15/2016  . Borderline personality disorder (HCC) [F60.3] 05/04/2016  . Overdose [T50.901A] 05/03/2016  . Noncompliance [Z91.19] 05/03/2016
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
## 2017-10-24 NOTE — Tx Team (Signed)
Interdisciplinary Treatment and Diagnostic Plan Update  10/24/2017 Time of Session: 1403 Brittany [name] MRN: [id number]  Principal Diagnosis: Bipolar 1 disorder, depressed, severe ([name])  Secondary Diagnoses: Principal Problem:   Bipolar 1 disorder, depressed, severe ([name]) Active Problems:   Borderline personality disorder ([name])   Current Medications:  Current Facility-Administered Medications  Medication Dose Route Frequency Provider Last Rate Last Dose  . acetaminophen (TYLENOL) tablet 650 mg  650 mg Oral Q6H PRN Lindon Romp A, NP      . alum & mag hydroxide-simeth (MAALOX/MYLANTA) 200-200-20 MG/5ML suspension 30 mL  30 mL Oral Q4H PRN Lindon Romp A, NP      . hydrOXYzine (ATARAX/VISTARIL) tablet 25 mg  25 mg Oral TID PRN Lindon Romp A, NP      . LORazepam (ATIVAN) tablet 1 mg  1 mg Oral QID Lindon Romp A, NP   1 mg at 10/24/17 1152   Followed by  . [START ON 10/25/2017] LORazepam (ATIVAN) tablet 1 mg  1 mg Oral TID [name], NP       Followed by  . [START ON 10/26/2017] LORazepam (ATIVAN) tablet 1 mg  1 mg Oral BID [name], NP       Followed by  . [START ON 10/28/2017] LORazepam (ATIVAN) tablet 1 mg  1 mg Oral Daily Lindon Romp A, NP      . magnesium hydroxide (MILK OF MAGNESIA) suspension 30 mL  30 mL Oral Daily PRN Lindon Romp A, NP      . traZODone (DESYREL) tablet 300 mg  300 mg Oral QHS PRN Lindon Romp A, NP   300 mg at 10/23/17 2333   PTA Medications: Medications Prior to Admission  Medication Sig Dispense Refill Last Dose  . ARIPiprazole (ABILIFY) 10 MG tablet Take 1 tablet (10 mg total) by mouth daily. For mood control 13 tablet 0   . ARIPiprazole ER 400 MG SRER Inject 400 mg into the muscle every 28 (twenty-eight) days. For mood control Next injection due 08/19/17 1 each 0   . furosemide (LASIX) 20 MG tablet Take 1 tablet (20 mg total) by mouth daily. For swelling 30 tablet 0   . hydrOXYzine (ATARAX/VISTARIL) 25 MG tablet Take 1 tablet (25 mg total)  by mouth every 6 (six) hours as needed (anxiety/agitation). 30 tablet 0   . loratadine (CLARITIN) 10 MG tablet Take 1 tablet (10 mg total) by mouth daily. (May purchase from over the counter): For allergies, itching     . Oxcarbazepine (TRILEPTAL) 300 MG tablet Take 1 tablet (300 mg total) by mouth 2 (two) times daily. For mood control 60 tablet 0   . traZODone (DESYREL) 150 MG tablet Take 1 tablet (150 mg total) by mouth at bedtime and may repeat dose one time if needed. 30 tablet 0     Patient Stressors: Medication change or noncompliance Substance abuse  Patient Strengths: Ability for insight Active sense of humor Average or above average intelligence Motivation for treatment/growth Supportive family/friends  Treatment Modalities: Medication Management, Group therapy, Case management,  1 to 1 session with clinician, Psychoeducation, Recreational therapy.   Physician Treatment Plan for Primary Diagnosis: Bipolar 1 disorder, depressed, severe ([place]) Long Term Goal(s):     Short Term Goals:    Medication Management: Evaluate patient's response, side effects, and tolerance of medication regimen.  Therapeutic Interventions: 1 to 1 sessions, Unit Group sessions and Medication administration.  Evaluation of Outcomes: Not Met  Physician Treatment Plan for Secondary Diagnosis: Principal Problem:  Bipolar 1 disorder, depressed, severe ([place]) Active Problems:   Borderline personality disorder ([place])  Long Term Goal(s):     Short Term Goals:       Medication Management: Evaluate patient's response, side effects, and tolerance of medication regimen.  Therapeutic Interventions: 1 to 1 sessions, Unit Group sessions and Medication administration.  Evaluation of Outcomes: Not Met   RN Treatment Plan for Primary Diagnosis: Bipolar 1 disorder, depressed, severe ([place]) Long Term Goal(s): Knowledge of disease and therapeutic regimen to maintain health will improve  Short Term Goals:  Ability to identify and develop effective coping behaviors will improve and Compliance with prescribed medications will improve  Medication Management: RN will administer medications as ordered by provider, will assess and evaluate patient's response and provide education to patient for prescribed medication. RN will report any adverse and/or side effects to prescribing provider.  Therapeutic Interventions: 1 on 1 counseling sessions, Psychoeducation, Medication administration, Evaluate responses to treatment, Monitor vital signs and CBGs as ordered, Perform/monitor CIWA, COWS, AIMS and Fall Risk screenings as ordered, Perform wound care treatments as ordered.  Evaluation of Outcomes: Not Met   [name] Treatment Plan for Primary Diagnosis: Bipolar 1 disorder, depressed, severe ([place]) Long Term Goal(s): Safe transition to appropriate next level of care at discharge, Engage patient in therapeutic group addressing interpersonal concerns.  Short Term Goals: Engage patient in aftercare planning with referrals and resources, Increase social support and Increase skills for wellness and recovery  Therapeutic Interventions: Assess for all discharge needs, 1 to 1 time with Social worker, Explore available resources and support systems, Assess for adequacy in community support network, Educate family and significant other(s) on suicide prevention, Complete Psychosocial Assessment, Interpersonal group therapy.  Evaluation of Outcomes: Not Met   Progress in Treatment: Attending groups: Yes. Participating in groups: Yes. Taking medication as prescribed: Yes. Toleration medication: Yes. Family/Significant other contact made: No, will contact:  husband Patient understands diagnosis: Yes. Discussing patient identified problems/goals with staff: Yes. Medical problems stabilized or resolved: Yes. Denies suicidal/homicidal ideation: Yes. Issues/concerns per patient self-inventory: No. Other: none  New  problem(s) identified: No, Describe:  none  New Short Term/Long Term Goal(s):  Discharge Plan or Barriers:   Reason for Continuation of Hospitalization: Depression Medication stabilization  Estimated Length of Stay: 3-5 days.  Attendees: Patient: 10/24/2017   Physician: Dr Parke Poisson, MD 10/24/2017   Nursing: Grayland Ormond, RN 10/24/2017   RN Care Manager: 10/24/2017   Social Worker: Lurline Idol, [name] 10/24/2017   Recreational Therapist:  10/24/2017   Other:  10/24/2017   Other:  10/24/2017   Other: 10/24/2017        Scribe for Treatment Team: Joanne Chars, [place] 10/24/2017 2:03 PM

## 2017-10-24 NOTE — Progress Notes (Signed)
Patient is 32 yr old female admitted voluntarily with complaints of increased depression and SI. She rpeorts that she has not taken her medications in 2-3 weeks because they make her feel horrible. She reports being in bed for the past 5 days in deep depression. She admits that she smoke THC in the oil form and does this daily. She reports multiple previous suicide attempts. She was pleasant and cooperative during admission. Past medical history include bipolar, depression, anxiety and borderline personality.

## 2017-10-24 NOTE — Progress Notes (Signed)
Patient ID: Brittany [name], female   DOB: [birth year], 32 y.o.   MRN: [id number]  Pt currently presents with a anxious affect and hyperactive behavior. Pt mood is labile ad agitated. Pt preoccupied with not taking her xanax and her interaction with MD. Pt states "I know I am going to be mean tomorrow, irritable and anxious. It is going to be a bad day. I am not going to go to breakfast in the morning." Pt reports poor sleep with current medication regimen. Reports she takes 300mg  of Trazodone at home, wakes up two hours later and takes more.  Pt provided with medications per providers orders. Pt's labs and vitals were monitored throughout the night. Pt given a 1:1 about emotional and mental status. Pt supported and encouraged to express concerns and questions. Pt educated on medications and CBT, needs reinforcement. Encouraged to speak with MD tomorrow and go to breakfast.   Pt's safety ensured with 15 minute and environmental checks. Pt currently denies SI/HI and A/V hallucinations. Pt verbally agrees to seek staff if SI/HI or A/VH occurs and to consult with staff before acting on any harmful thoughts. Will continue POC.

## 2017-10-24 NOTE — Progress Notes (Signed)
Recreation Therapy Notes  Date: 10/24/17 Time: 0930 Location: 300 Hall Dayroom  Group Topic: Stress Management  Goal Area(s) Addresses:  Patient will verbalize importance of using healthy stress management.  Patient will identify positive emotions associated with healthy stress management.   Intervention: Stress Management  Activity :  [employer] [facility].  LRT introduced the stress management technique of guided imagery.  LRT read a script to allow patients to visualize being in a protected wildlife sanctuary.  Patients were to follow along as script was read to engage in activity.  Education:  Stress Management, Discharge Planning.   Education Outcome: Acknowledges edcuation/In group clarification offered/Needs additional education  Clinical Observations/Feedback: Pt did not attend group.     Caroll RancherMarjette Amarrah Meinhart, LRT/CTRS         Caroll RancherLindsay, Latesia Norrington A 10/24/2017 12:46 PM
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
## 2017-10-24 NOTE — Progress Notes (Signed)
Pt presents with a flat affect and anxious mood. Pt noted to be fidgety, irritable and  worrying a lot on approach. Pt reports increased depression 10/10. Pt reports feeling increasingly anxious and stated that it's beyond a 10. Pt reports racing thoughts. Pt endorses passive SI with no active suicidal thoughts. Pt verbally contracts for safety. Pt stated that she takes 6 mg of Xanax a day, 2 mg TID. Pt verbalized to writer that it concerns her and makes her feel afraid that she's only taking Ativan 1 mg according to a detox protocol. Pt verbalized that she's been taking Xanax for the last 15 years and her body is dependent on it.  Medications reviewed with pt. Medications administered as ordered per MD. Verbal support provided. Pt encouraged to attend groups. 15 minute checks performed for safety.  Pt compliant with tx plan but disagrees with med regimen.

## 2017-10-24 NOTE — Tx Team (Addendum)
Initial Treatment Plan 10/24/2017 12:51 AM Brittany [name] UJW:[id number]    PATIENT STRESSORS: Medication change or noncompliance Substance abuse   PATIENT STRENGTHS: Ability for insight Active sense of humor Average or above average intelligence Motivation for treatment/growth Supportive family/friends   PATIENT IDENTIFIED PROBLEMS: Depression  Anxiety  THC abuse (pt reports she uses THC oil and vapes daily)  SI    Goal - Pt reports she wants to find the right medications and coping mechanisms to be stable and have conversation without crying           DISCHARGE CRITERIA:  Improved stabilization in mood, thinking, and/or behavior  PRELIMINARY DISCHARGE PLAN: Return to previous living arrangement  PATIENT/FAMILY INVOLVEMENT: This treatment plan has been presented to and reviewed with the patient, Brittany FasterChristine H Storrs, and/or family member,  The patient and family have been given the opportunity to ask questions and make suggestions.  Floyce StakesGarrison, Anas Reister, RN 10/24/2017, 12:51 AM

## 2017-10-24 NOTE — [facility] Group Notes (Signed)
[facility] [name] Group Therapy Note  Date/Time: 10/24/17, 1315  Type of Therapy and Topic:  Group Therapy:  Overcoming Obstacles  Participation Level:  active  Description of Group:    In this group patients will be encouraged to explore what they see as obstacles to their own wellness and recovery. They will be guided to discuss their thoughts, feelings, and behaviors related to these obstacles. The group will process together ways to cope with barriers, with attention given to specific choices patients can make. Each patient will be challenged to identify changes they are motivated to make in order to overcome their obstacles. This group will be process-oriented, with patients participating in exploration of their own experiences as well as giving and receiving support and challenge from other group members.  Therapeutic Goals: 1. Patient will identify personal and current obstacles as they relate to admission. 2. Patient will identify barriers that currently interfere with their wellness or overcoming obstacles.  3. Patient will identify feelings, thought process and behaviors related to these barriers. 4. Patient will identify two changes they are willing to make to overcome these obstacles:    Summary of Patient Progress:   Pt identified depression/anxiety, PTSD, bipolar, and borderline personality disorder as current obstacles.  Pt participated in group discussion regarding the struggle to find ways to overcome both short term and long term obstacles.      Therapeutic Modalities:   Cognitive Behavioral Therapy Solution Focused Therapy Motivational Interviewing Relapse Prevention Therapy  Daleen SquibbGreg Chrystle Murillo, [name]

## 2017-10-25 DIAGNOSIS — F1210 Cannabis abuse, uncomplicated: Secondary | ICD-10-CM

## 2017-10-25 DIAGNOSIS — Z87891 Personal history of nicotine dependence: Secondary | ICD-10-CM

## 2017-10-25 DIAGNOSIS — F603 Borderline personality disorder: Principal | ICD-10-CM

## 2017-10-25 MED ORDER — TRAZODONE HCL 150 MG PO TABS
300.0000 mg | ORAL_TABLET | Freq: Every day | ORAL | Status: DC
  Administered 2017-10-25 – 2017-10-26 (×2): 300 mg via ORAL
  Filled 2017-10-25 (×5): qty 2

## 2017-10-25 MED ORDER — GABAPENTIN 300 MG PO CAPS
300.0000 mg | ORAL_CAPSULE | Freq: Three times a day (TID) | ORAL | Status: DC
  Administered 2017-10-25 – 2017-10-26 (×4): 300 mg via ORAL
  Filled 2017-10-25 (×9): qty 1

## 2017-10-25 MED ORDER — CLONAZEPAM 0.5 MG PO TABS
0.5000 mg | ORAL_TABLET | Freq: Two times a day (BID) | ORAL | Status: AC
  Administered 2017-10-25 – 2017-10-27 (×5): 0.5 mg via ORAL
  Filled 2017-10-25 (×5): qty 1

## 2017-10-25 MED ORDER — ONDANSETRON HCL 4 MG PO TABS
4.0000 mg | ORAL_TABLET | Freq: Three times a day (TID) | ORAL | Status: DC | PRN
  Administered 2017-10-25: 4 mg via ORAL
  Filled 2017-10-25: qty 1

## 2017-10-25 NOTE — [facility] Group Notes (Signed)
[facility] [name] Group Therapy Note  Date/Time: 10/25/17, 1315  Type of Therapy/Topic:  Group Therapy:  Feelings about Diagnosis  Participation Level:  Did Not Attend   Mood:   Description of Group:    This group will allow patients to explore their thoughts and feelings about diagnoses they have received. Patients will be guided to explore their level of understanding and acceptance of these diagnoses. Facilitator will encourage patients to process their thoughts and feelings about the reactions of others to their diagnosis, and will guide patients in identifying ways to discuss their diagnosis with significant others in their lives. This group will be process-oriented, with patients participating in exploration of their own experiences as well as giving and receiving support and challenge from other group members.   Therapeutic Goals: 1. Patient will demonstrate understanding of diagnosis as evidence by identifying two or more symptoms of the disorder:  2. Patient will be able to express two feelings regarding the diagnosis 3. Patient will demonstrate ability to communicate their needs through discussion and/or role plays  Summary of Patient Progress:        Therapeutic Modalities:   Cognitive Behavioral Therapy Brief Therapy Feelings Identification   Greg Demone Lyles, [name] 

## 2017-10-25 NOTE — Progress Notes (Signed)
Nursing Progress Note 1900-0730  D) Patient presents mildly anxious but was cooperative with writer this evening. Patient did attend group. Patient is seen interactive in the milieu. Patient states "I have taken Xanax every day for the last 15 years. They won't give it to me here, but I will go right back on it when I leave". Patient denies SI/HI/AVH or pain. Patient contracts for safety on the unit. Patient requests PRN vistaril this evening and was provided scheduled Trazodone. Patient does not appear to have any withdrawal symptoms at this time but does complain of intermittent nausea and aches. Patient declines need for PRN medications at bedtime.  A) Emotional support given. 1:1 interaction and active listening provided. Patient medicated as prescribed. Medications and plan of care reviewed with patient. Patient verbalized understanding without further questions. Snacks and fluids provided. Opportunities for questions or concerns presented to patient. Patient encouraged to continue to work on treatment goals. Labs, vital signs and patient behavior monitored throughout shift. Patient safety maintained with q15 min safety checks. Moderate fall risk precautions in place and reviewed with patient; patient verbalized understanding.  R) Patient receptive to interaction with nurse. Patient remains safe on the unit at this time. Patient denies any adverse medication reactions at this time. Patient is resting in bed without complaints. Will continue to monitor.

## 2017-10-25 NOTE — Progress Notes (Signed)
The focus of this group is to help patients review their daily goal of treatment and discuss progress on daily workbooks. Pt attended the evening group session and responded to all discussion prompts from the Writer. Pt shared that today was a good day on the unit, the highlight of which was making friends with her peers. "I love everyone here. I feel a lot of warmth and connection from others on the hallway."  Pt also mentioned feeling supported by her husband, who was visiting on the unit just before wrap-up group.  Pt rated her day a 7 out of 10 and her affect was appropriate.

## 2017-10-25 NOTE — Progress Notes (Signed)
DAR NOTE: Patient presents with anxious affect and depressed mood.  Denies pain, auditory and visual hallucinations.  Rates depression at 9, hopelessness at 9, and anxiety at 12.  Maintained on routine safety checks.  Medications given as prescribed.  Support and encouragement offered as needed.  Attended group and participated.  States goal for today is "survival."  Patient observed socializing with peers in the dayroom.  Offered no complaint.

## 2017-10-25 NOTE — Progress Notes (Signed)
Recreation Therapy Notes  Patient Eligibility Criteria Checklist & Daily Group note for Rec TxIntervention  Date: 01.15.2019 Time: 2:45pm [facility] [address]   AAA/T Program Assumption of Risk Form signed by Patient/ or Parent Legal Guardian Yes  Patient is free of allergies or sever asthma Yes  Patient reports no fear of animals Yes  Patient reports no history of cruelty to animals Yes  Patient understands his/her participation is voluntary Yes  Behavioral Response: Did not attend.   Marykay Lexenise L Zarina Pe, LRT/CTRS       Lannie Heaps L 10/25/2017 3:03 PM

## 2017-10-25 NOTE — Progress Notes (Signed)
[facility] MD Progress Note  10/25/2017 6:12 PM Brittany [name]  MRN:  [id number] Subjective:    32 y.o Caucasian female, married, lives with her family. Background history of Bipolar disorder unspecified, [employer]. Presented to the the unit in company of her husband. Expressed increased depression lately. Expressed thoughts of suicide. Has plans to get the police shoot her . Had stopped taking all her medications. Multiple medication switching recently. Her therapist recently abandoned her. Routine labs is significant for elevated lipids, leucocytosis. Ttoxicology is negative,  UDS for THC and Benzodiazepines. No alcohol.   Chart reviewed today. Patient discussed at team today. Patient is reported to be focused on getting back on Alprazolam. She also wants her Trazodone titrated back to home dose. She has been out with peers. Has rapid shifts of emotions. Reactive affect and engages with peers.  Seen today. Patient was pleasant and cheerful at presentation. She then broke down when she was talking about recent abandonment by her therapist. Says her therapist wants her to do DBT. Patient says she just was not doin well with everything hence she stopped taking her medications. Very focused on her controlled substances. Now wishes she did not have to come in here. Says she has not felt suicidal today. She is worried she might have a panic attack.  No hallucination in any modality. No other form of perceptual abnormality. No feelings of things being unreal. No feeling of impending doom. Not expressing any delusional statement.  No homicidal thoughts. No thoughts of violence. Not expressing any new stressors at home. Has good relationship with her family. Says her husband is very supportive. He is taking care of their 34 year old.  Principal Problem: Bipolar 1 disorder, depressed, severe ([place]) Diagnosis:   Patient Active Problem List   Diagnosis Date Noted  . Bipolar 1 disorder, depressed, severe ([place]) [F31.4]  10/23/2017  . Bipolar 1 disorder ([place]) [F31.9] 07/18/2017  . Bipolar disorder, curr episode mixed, severe, with psychotic features () [F31.64] 06/08/2017  . ADHD (attention deficit hyperactivity disorder) [F90.9] 09/15/2016  . Borderline personality disorder ([place]) [F60.3] 05/04/2016  . Overdose [T50.901A] 05/03/2016  . Noncompliance [Z91.19] 05/03/2016   Total Time spent with patient: 30 minutes  Past Psychiatric History: As in H&P  Past Medical History:  Past Medical History:  Diagnosis Date  . ADHD (attention deficit hyperactivity disorder)   . Anxiety   . Arthritis    right shoulder, bilateral knees  . Bipolar disorder ([place])   . Bipolar I disorder, most recent episode depressed ([place])   . Chronic kidney disease    Kidney stones  . Depression   . Headache     Past Surgical History:  Procedure Laterality Date  . CESAREAN SECTION    . CHOLECYSTECTOMY  2017  . FOOT SURGERY    . KIDNEY SURGERY    . [place]  2010  . SHOULDER SURGERY Right   . TUBAL LIGATION     Family History:  Family History  Problem Relation Age of Onset  . Anxiety disorder Mother   . Depression Mother   . Drug abuse Mother   . Bipolar disorder Mother   . Depression Father   . Anxiety disorder Father   . Drug abuse Father   . Anxiety disorder Sister   . Depression Sister   . ADD / ADHD Sister   . Anxiety disorder Sister   . Depression Sister    Family Psychiatric  History: As in H&P Social History:  Social History  Substance and Sexual Activity  Alcohol Use Yes  . Alcohol/week: 0.0 oz   Comment: pt repeorts maybe drinks once a month     Social History   Substance and Sexual Activity  Drug Use Yes  . Types: Marijuana   Comment: pt reports THC oil, vapes daily    Social History   Socioeconomic History  . Marital status: Married    Spouse name: None  . Number of children: None  . Years of education: None  . Highest education level: None  Social Needs  . Financial  resource strain: None  . Food insecurity - worry: None  . Food insecurity - inability: None  . Transportation needs - medical: None  . Transportation needs - non-medical: None  Occupational History  . None  Tobacco Use  . Smoking status: Former Smoker    Types: E-cigarettes  . Smokeless tobacco: Never Used  Substance and Sexual Activity  . Alcohol use: Yes    Alcohol/week: 0.0 oz    Comment: pt repeorts maybe drinks once a month  . Drug use: Yes    Types: Marijuana    Comment: pt reports THC oil, vapes daily  . Sexual activity: Yes    Birth control/protection: None  Other Topics Concern  . None  Social History Narrative  . None   Additional Social History:    Pain Medications: See MAR Prescriptions: See MAR Over the Counter: See MAR History of alcohol / drug use?: Yes Longest period of sobriety (when/how long): One year Negative Consequences of Use: Financial, Personal relationships Withdrawal Symptoms: Agitation Name of Substance 1: Marijuana 1 - Age of First Use: Adolescent 1 - Amount (size/oz): One joint 1 - Frequency: Daily 1 - Duration: Ongoing 1 - Last Use / Amount: 10/23/17                  Sleep: Good  Appetite:  Good  Current Medications: Current Facility-Administered Medications  Medication Dose Route Frequency Provider Last Rate Last Dose  . acetaminophen (TYLENOL) tablet 650 mg  650 mg Oral Q6H PRN Lindon Romp A, NP   650 mg at 10/25/17 1209  . alum & mag hydroxide-simeth (MAALOX/MYLANTA) 200-200-20 MG/5ML suspension 30 mL  30 mL Oral Q4H PRN Lindon Romp A, NP      . hydrOXYzine (ATARAX/VISTARIL) tablet 25 mg  25 mg Oral TID PRN Lindon Romp A, NP   25 mg at 10/25/17 1006  . LORazepam (ATIVAN) tablet 1 mg  1 mg Oral TID Lindon Romp A, NP   1 mg at 10/25/17 1726   Followed by  . [START ON 10/26/2017] LORazepam (ATIVAN) tablet 1 mg  1 mg Oral BID [name], NP       Followed by  . [START ON 10/28/2017] LORazepam (ATIVAN) tablet 1 mg  1 mg  Oral Daily Lindon Romp A, NP      . magnesium hydroxide (MILK OF MAGNESIA) suspension 30 mL  30 mL Oral Daily PRN Lindon Romp A, NP      . ondansetron (ZOFRAN) tablet 4 mg  4 mg Oral Q8H PRN Artist Beach, MD   4 mg at 10/25/17 1208  . traZODone (DESYREL) tablet 100 mg  100 mg Oral QHS PRN,MR X 1 Cobos, Myer Peer, MD   100 mg at 10/24/17 2248  . ziprasidone (GEODON) capsule 20 mg  20 mg Oral BID WC Cobos, Myer Peer, MD   20 mg at 10/25/17 1726    Lab Results:  Results  for orders placed or performed during the hospital encounter of 10/23/17 (from the past 48 hour(s))  Pregnancy, urine     Status: None   Collection Time: 10/23/17 10:51 PM  Result Value Ref Range   Preg Test, Ur NEGATIVE NEGATIVE    Comment:        THE SENSITIVITY OF THIS METHODOLOGY IS >20 mIU/mL. Performed at [facility], [place] [address], [place], [place] [address]   Urine rapid drug screen (hosp performed)not at [facility]     Status: Abnormal   Collection Time: 10/23/17 10:51 PM  Result Value Ref Range   Opiates NONE DETECTED NONE DETECTED   Cocaine NONE DETECTED NONE DETECTED   Benzodiazepines POSITIVE (A) NONE DETECTED   Amphetamines NONE DETECTED NONE DETECTED   Tetrahydrocannabinol POSITIVE (A) NONE DETECTED   Barbiturates NONE DETECTED NONE DETECTED    Comment: (NOTE) DRUG SCREEN FOR MEDICAL PURPOSES ONLY.  IF CONFIRMATION IS NEEDED FOR ANY PURPOSE, NOTIFY LAB WITHIN 5 DAYS. LOWEST DETECTABLE LIMITS FOR URINE DRUG SCREEN Drug Class                     Cutoff (ng/mL) Amphetamine and metabolites    1000 Barbiturate and metabolites    200 Benzodiazepine                 343 Tricyclics and metabolites     300 Opiates and metabolites        300 Cocaine and metabolites        300 THC                            50 Performed at [facility] [place] [address], [place], [place] [address]   CBC     Status: Abnormal   Collection Time: 10/24/17  6:15 AM  Result Value Ref  Range   WBC 10.6 (H) 4.0 - 10.5 K/uL   RBC 4.49 3.87 - 5.11 MIL/uL   Hemoglobin 13.9 12.0 - 15.0 g/dL   HCT 41.7 36.0 - 46.0 %   MCV 92.9 78.0 - 100.0 fL   MCH 31.0 26.0 - 34.0 pg   MCHC 33.3 30.0 - 36.0 g/dL   RDW 13.8 11.5 - 15.5 %   Platelets 223 150 - 400 K/uL    Comment: Performed at [facility], [place] [address], [place], [place] [address]  Comprehensive metabolic panel     Status: Abnormal   Collection Time: 10/24/17  6:15 AM  Result Value Ref Range   Sodium 137 135 - 145 mmol/L   Potassium 3.9 3.5 - 5.1 mmol/L   Chloride 104 101 - 111 mmol/L   CO2 22 22 - 32 mmol/L   Glucose, Bld 104 (H) 65 - 99 mg/dL   BUN 21 (H) 6 - 20 mg/dL   Creatinine, Ser 0.74 0.44 - 1.00 mg/dL   Calcium 9.4 8.9 - 10.3 mg/dL   Total Protein 7.7 6.5 - 8.1 g/dL   Albumin 4.0 3.5 - 5.0 g/dL   AST 20 15 - 41 U/L   ALT 19 14 - 54 U/L   Alkaline Phosphatase 60 38 - 126 U/L   Total Bilirubin 0.6 0.3 - 1.2 mg/dL   GFR calc non Af Amer >60 >60 mL/min   GFR calc Af Amer >60 >60 mL/min    Comment: (NOTE) The eGFR has been calculated using the CKD EPI equation. This calculation has not been validated  in all clinical situations. eGFR's persistently <60 mL/min signify possible Chronic Kidney Disease.    Anion gap 11 5 - 15    Comment: Performed at [facility], [place] [address] [place], [place] [address]  Hemoglobin A1c     Status: Abnormal   Collection Time: 10/24/17  6:15 AM  Result Value Ref Range   Hgb A1c MFr Bld 4.7 (L) 4.8 - 5.6 %    Comment: (NOTE) Pre diabetes:          5.7%-6.4% Diabetes:              >6.4% Glycemic control for   <7.0% adults with diabetes    Mean Plasma Glucose 88.19 mg/dL    Comment: Performed at [place] [address] [place], [place] [address]  Lipid panel     Status: Abnormal   Collection Time: 10/24/17  6:15 AM  Result Value Ref Range   Cholesterol 290 (H) 0 - 200 mg/dL   Triglycerides 155 (H) <150 mg/dL   HDL 56  >40 mg/dL   Total CHOL/HDL Ratio 5.2 RATIO   VLDL 31 0 - 40 mg/dL   LDL Cholesterol 203 (H) 0 - 99 mg/dL    Comment:        Total Cholesterol/HDL:CHD Risk Coronary Heart Disease Risk Table                     Men   Women  1/2 Average Risk   3.4   3.3  Average Risk       5.0   4.4  2 X Average Risk   9.6   7.1  3 X Average Risk  23.4   11.0        Use the calculated Patient Ratio above and the CHD Risk Table to determine the patient's CHD Risk.        ATP III CLASSIFICATION (LDL):  <100     mg/dL   Optimal  100-129  mg/dL   Near or Above                    Optimal  130-159  mg/dL   Borderline  160-189  mg/dL   High  >190     mg/dL   Very High Performed at [place] [address] [place], [place] [address]   TSH     Status: None   Collection Time: 10/24/17  6:15 AM  Result Value Ref Range   TSH 3.045 0.350 - 4.500 uIU/mL    Comment: Performed by a 3rd Generation assay with a functional sensitivity of <=0.01 uIU/mL. Performed at [facility], [place] [address] [place], [place] [address]     Blood Alcohol level:  Lab Results  Component Value Date   ETH <5 20/60/1561    Metabolic Disorder Labs: Lab Results  Component Value Date   HGBA1C 4.7 (L) 10/24/2017   MPG 88.19 10/24/2017   MPG 96.8 06/10/2017   Lab Results  Component Value Date   PROLACTIN 25.3 (H) 07/21/2017   PROLACTIN 57.7 (H) 06/10/2017   Lab Results  Component Value Date   CHOL 290 (H) 10/24/2017   TRIG 155 (H) 10/24/2017   HDL 56 10/24/2017   CHOLHDL 5.2 10/24/2017   VLDL 31 10/24/2017   LDLCALC 203 (H) 10/24/2017   LDLCALC 117 (H) 06/10/2017    Physical Findings: AIMS:  , ,  ,  ,    CIWA:  CIWA-Ar Total: 7 COWS:     Musculoskeletal: Strength & Muscle Tone: within normal limits Gait & Station: normal Patient leans: N/A  Psychiatric Specialty Exam: Physical Exam  Constitutional: She is oriented to person, place, and time. She appears  well-developed and well-nourished.  HENT:  Head: Normocephalic and atraumatic.  Respiratory: Effort normal.  Neurological: She is alert and oriented to person, place, and time.  Psychiatric:  As above    ROS  Blood pressure 123/70, pulse 73, temperature 97.8 F (36.6 C), temperature source Oral, resp. rate 12, height '5\' 4"'  (1.626 m), weight 113.9 kg (251 lb), SpO2 100 %.Body mass index is 43.08 kg/m.  General Appearance: Neatly dressed, good relatedness.   Eye Contact:  Good  Speech:  Clear and Coherent and Normal Rate  Volume:  Normal  Mood:  Subjectively feels depressed. Not objectively depressed.   Affect:  Full Range  Thought Process:  Linear  Orientation:  Full (Time, Place, and Person)  Thought Content:  Very focused on medications and need to be here long enough. No delusional theme. No preoccupation with violent thoughts.  No hallucination in any modality.   Suicidal Thoughts:  None currently  Homicidal Thoughts:  No  Memory:  Immediate;   Good Recent;   Good Remote;   Good  Judgement:  Fair  Insight:  Fair  Psychomotor Activity:  Normal  Concentration:  Concentration: Good and Attention Span: Good  Recall:  Good  Fund of Knowledge:  Good  Language:  Good  Akathisia:  Negative  Handed:    AIMS (if indicated):     Assets:  Agricultural consultant Housing Intimacy Physical Health Resilience  ADL's:  Intact  Cognition:  WNL  Sleep:  Number of Hours: 6.25     Treatment Plan Summary: Patient is not pervasively depressed. She is not manic or psychotic. No evidence of dissociation. She has shallow and rapidly shifting emotions. She does not have any withdrawal symptoms. We discussed continuation of her current mood stabilizer. We agreed to optimize her Trazodone to home dose. We discussed benzodiazepine taper with Clonazepam. We also agreed to use gabapentin to target her anxiety.  She consented to treatment after we reviewed the risks and  benefits.   Psychiatric: Borderline PD  Medical:  Psychosocial:   PLAN: 1. Discontinue Ativan 2. Switch to Clonazepam 0.5 mg BID. Would taper gradually 3. Gabapentin 300 mg TID 4. SW would obtain collateral from her husband    Artist Beach, MD 10/25/2017, 6:12 PM

## 2017-10-25 NOTE — [facility] Suicide Risk Assessment (Signed)
[facility] INPATIENT:  Family/Significant Other Suicide Prevention Education  Suicide Prevention Education:  Education Completed;  Chaney MallingJoseph Ogborn, husband, [phone],has been identified by the patient as the family member/significant other with whom the patient will be residing, and identified as the person(s) who will aid the patient in the event of a mental health crisis (suicidal ideations/suicide attempt).  With written consent from the patient, the family member/significant other has been provided the following suicide prevention education, prior to the and/or following the discharge of the patient.  The suicide prevention education provided includes the following:  Suicide risk factors  Suicide prevention and interventions  National Suicide Hotline telephone number  [facility] assessment telephone number  [facility] Assistance 911  [facility] and/or Residential Mobile Crisis Unit telephone number  Request made of family/significant other to:  Remove weapons (e.g., guns, rifles, knives), all items previously/currently identified as safety concern.  No guns in the home, per [place] [facility].  Remove drugs/medications (over-the-counter, prescriptions, illicit drugs), all items previously/currently identified as a safety concern.  The family member/significant other verbalizes understanding of the suicide prevention education information provided.  The family member/significant other agrees to remove the items of safety concern listed above.  Jomarie LongsJoseph reports pt has been hospitalized multiple times in the 2 years they have been married and he always keeps a close eye on her.  He is also aware of her symptoms when she is getting worse: "her emotions are all over the place."    Lorri FrederickWierda, Annalaura Sauseda Jon, [name] 10/25/2017, 2:32 PM

## 2017-10-25 NOTE — [facility] Group Notes (Signed)
Adult Psychoeducational Group Note  Date:  10/25/2017 Time:  4:41 AM  Group Topic/Focus:  Wrap-Up Group:   The focus of this group is to help patients review their daily goal of treatment and discuss progress on daily workbooks.  Participation Level:  Active  Participation Quality:  Appropriate and Attentive  Affect:  Appropriate  Cognitive:  Alert and Appropriate  Insight: Appropriate and Good  Engagement in Group:  Engaged  Modes of Intervention:  Discussion and Education  Additional Comments:  Pt attended and participated in wrap up group. Pt rated their day a 9 because they were having good vibes with the other patients and engaging with them, which was very stimulating. Pt goal was to "get through it". A positive noted by the pt was that they played dodgeball and "felt amazing after".   Brittany NettersOctavia A Jalecia [name] 10/25/2017, 4:41 AM

## 2017-10-25 NOTE — Progress Notes (Signed)
DAR NOTE: Patient presents with anxious affect and irritable mood.  Denies suicidal thoughts and visual hallucination.  Complain of nausea, vomiting, irritability and generalized discomfort during assessment.  Patient crying hysterically during assessment.  Requesting to have her trazodone increased to 300 mg and Xanax.  Described energy level as low and concentration as poor.  Rates depression at 4, hopelessness at 4, and anxiety at 12.  Maintained on routine safety checks.  Medications given as prescribed.  Support and encouragement offered as needed.  States goal for today is "realize the positive things going on in my life."  Patient remained in her room most of this shift.    Vistaril  25 mg given for anxiety and Zofran for nausea with good effect.  Patient is safe on the unit.

## 2017-10-26 MED ORDER — GABAPENTIN 400 MG PO CAPS
400.0000 mg | ORAL_CAPSULE | Freq: Three times a day (TID) | ORAL | Status: DC
  Administered 2017-10-27 (×3): 400 mg via ORAL
  Filled 2017-10-26 (×8): qty 1

## 2017-10-26 NOTE — Progress Notes (Signed)
Nursing Progress Note 1900-0730  D) Patient presents with animated affect, euphoric mood and is pleasant/cooperative with writer this evening. Patient did attend group. Patient is seen interactive in the milieu. Patient denies SI/HI/AVH or pain. Patient contracts for safety on the unit. Patient provided scheduled Trazodone and requests Vistaril this evening. Patient reports that she "feelings great" and that "my depression has decrease a lot". Patient reports, "I am still anxious but the doctor said he would increase my Gabapentin tomorrow". Patient denied other concerns for writer this evening.  A) Emotional support given. 1:1 interaction and active listening provided. Patient medicated as prescribed. Medications and plan of care reviewed with patient. Patient verbalized understanding without further questions. Snacks and fluids provided. Opportunities for questions or concerns presented to patient. Patient encouraged to continue to work on treatment goals. Labs, vital signs and patient behavior monitored throughout shift. Patient safety maintained with q15 min safety checks. Moderate fall risk precautions in place and reviewed with patient; patient verbalized understanding.  R) Patient receptive to interaction with nurse. Patient remains safe on the unit at this time. Patient denies any adverse medication reactions at this time. Patient is resting in bed without complaints. Will continue to monitor.

## 2017-10-26 NOTE — Plan of Care (Signed)
  Not Progressing Self-Concept: Level of anxiety will decrease 10/26/2017 1615 - Not Progressing by Lenord Fellersopson, Elsie Sakuma Elizabeth, RN   Patient reports that we are not giving her Xanax and she is used to taking 6 mg a day. She reports her anxiety 12/10.

## 2017-10-26 NOTE — Progress Notes (Signed)
Patient ID: Brittany [name], female   DOB: 1986/06/28, 32 y.o.   MRN: [id number]  DAR: Pt. Denies SI/HI and A/V Hallucinations. She reports that her sleep last night was fair, her appetite is good, her energy level is normal, and her concentration is good. She rates her depression level 5/10, her hopelessness level 5/10, and her anxiety level 12/10. Patient reports that she is used to taking 6 mg of Xanax daily and states, "I'm just going back on them when I get home." Patient has little insight into her benzo usage at this time. Patient reports high anxiety although on observation patient does not present overly anxious. Patient does not report any pain at this time. Support and encouragement provided to the patient. Scheduled medications administered to patient per physician's orders. Patient is seen in the milieu and interacting with her peers. Her affect is animated and mood is depressed. Q15 minute checks are maintained for safety.

## 2017-10-26 NOTE — Progress Notes (Signed)
Patient rated her day as a 10 out of 10. She verbalized that she was grateful for being in the hospital and that she enjoyed her groups. Her goal for tomorrow is to have a better day than today.

## 2017-10-26 NOTE — Progress Notes (Signed)
[facility] MD Progress Note  10/26/2017 7:01 PM Brittany [name]  MRN:  [id number] Subjective:    32 y.o Caucasian female, married, lives with her family. Background history of Bipolar disorder unspecified, [employer]. Presented to the the unit in company of her husband. Expressed increased depression lately. Expressed thoughts of suicide. Has plans to get the police shoot her . Had stopped taking all her medications. Multiple medication switching recently. Her therapist recently abandoned her. Routine labs is significant for elevated lipids, leucocytosis. Toxicology is negative,  UDS is positive for THC and Benzodiazepines. No alcohol.   Chart reviewed today. Patient discussed at team today.   Staff reports that she has been grooming self well. She has been socializing with peers. Reports subjective anxiety. No clinical correlation observed. She has been focused on benzodiazepines. She slept well last night. No suicidal , violent or homicidal thoughts. She has not been observed to be internally stimulated.  SW called her husband today. He notes that patient is fond of utilizing services. Says there are no new stressors. He does not have any concerns about her safety or that of others.   Seen today. Says she is feeling better. Wants to know if we could increase her Gabapentin. Excited about her family visiting today. Says she cannot wait to see them. Says she slept well last night. No evidence of psychosis. No evidence of mania. No evidence of depression. No objective anxiety during encounter.   Principal Problem: Borderline personality disorder (HCC) Diagnosis:   Patient Active Problem List   Diagnosis Date Noted  . Bipolar 1 disorder, depressed, severe (HCC) [F31.4] 10/23/2017  . Bipolar 1 disorder (HCC) [F31.9] 07/18/2017  . Bipolar disorder, curr episode mixed, severe, with psychotic features (HCC) [F31.64] 06/08/2017  . ADHD (attention deficit hyperactivity disorder) [F90.9] 09/15/2016  . Borderline  personality disorder (HCC) [F60.3] 05/04/2016  . Overdose [T50.901A] 05/03/2016  . Noncompliance [Z91.19] 05/03/2016   Total Time spent with patient: 30 minutes  Past Psychiatric History: As in H&P  Past Medical History:  Past Medical History:  Diagnosis Date  . ADHD (attention deficit hyperactivity disorder)   . Anxiety   . Arthritis    right shoulder, bilateral knees  . Bipolar disorder (HCC)   . Bipolar I disorder, most recent episode depressed (HCC)   . Chronic kidney disease    Kidney stones  . Depression   . Headache     Past Surgical History:  Procedure Laterality Date  . CESAREAN SECTION    . CHOLECYSTECTOMY  2017  . FOOT SURGERY    . KIDNEY SURGERY    . NOVASURE ABLATION  2010  . SHOULDER SURGERY Right   . TUBAL LIGATION     Family History:  Family History  Problem Relation Age of Onset  . Anxiety disorder Mother   . Depression Mother   . Drug abuse Mother   . Bipolar disorder Mother   . Depression Father   . Anxiety disorder Father   . Drug abuse Father   . Anxiety disorder Sister   . Depression Sister   . ADD / ADHD Sister   . Anxiety disorder Sister   . Depression Sister    Family Psychiatric  History: As in H&P Social History:  Social History   Substance and Sexual Activity  Alcohol Use Yes  . Alcohol/week: 0.0 oz   Comment: pt repeorts maybe drinks once a month     Social History   Substance and Sexual Activity  Drug Use Yes  .  Types: Marijuana   Comment: pt reports THC oil, vapes daily    Social History   Socioeconomic History  . Marital status: Married    Spouse name: None  . Number of children: None  . Years of education: None  . Highest education level: None  Social Needs  . Financial resource strain: None  . Food insecurity - worry: None  . Food insecurity - inability: None  . Transportation needs - medical: None  . Transportation needs - non-medical: None  Occupational History  . None  Tobacco Use  . Smoking status:  Former Smoker    Types: E-cigarettes  . Smokeless tobacco: Never Used  Substance and Sexual Activity  . Alcohol use: Yes    Alcohol/week: 0.0 oz    Comment: pt repeorts maybe drinks once a month  . Drug use: Yes    Types: Marijuana    Comment: pt reports THC oil, vapes daily  . Sexual activity: Yes    Birth control/protection: None  Other Topics Concern  . None  Social History Narrative  . None   Additional Social History:    Pain Medications: See MAR Prescriptions: See MAR Over the Counter: See MAR History of alcohol / drug use?: Yes Longest period of sobriety (when/how long): One year Negative Consequences of Use: Financial, Personal relationships Withdrawal Symptoms: Agitation Name of Substance 1: Marijuana 1 - Age of First Use: Adolescent 1 - Amount (size/oz): One joint 1 - Frequency: Daily 1 - Duration: Ongoing 1 - Last Use / Amount: 10/23/17                  Sleep: Good  Appetite:  Good  Current Medications: Current Facility-Administered Medications  Medication Dose Route Frequency Provider Last Rate Last Dose  . acetaminophen (TYLENOL) tablet 650 mg  650 mg Oral Q6H PRN Nira Conn A, NP   650 mg at 10/25/17 1209  . alum & mag hydroxide-simeth (MAALOX/MYLANTA) 200-200-20 MG/5ML suspension 30 mL  30 mL Oral Q4H PRN Nira Conn A, NP      . clonazePAM (KLONOPIN) tablet 0.5 mg  0.5 mg Oral BID Izediuno, Delight Ovens, MD   0.5 mg at 10/26/17 1646  . gabapentin (NEURONTIN) capsule 300 mg  300 mg Oral TID Georgiann Cocker, MD   300 mg at 10/26/17 1645  . hydrOXYzine (ATARAX/VISTARIL) tablet 25 mg  25 mg Oral TID PRN Jackelyn Poling, NP   25 mg at 10/25/17 2124  . magnesium hydroxide (MILK OF MAGNESIA) suspension 30 mL  30 mL Oral Daily PRN Nira Conn A, NP      . ondansetron (ZOFRAN) tablet 4 mg  4 mg Oral Q8H PRN Georgiann Cocker, MD   4 mg at 10/25/17 1208  . traZODone (DESYREL) tablet 300 mg  300 mg Oral QHS Izediuno, Delight Ovens, MD   300 mg at 10/25/17  2124  . ziprasidone (GEODON) capsule 20 mg  20 mg Oral BID WC Cobos, Rockey Situ, MD   20 mg at 10/26/17 1646    Lab Results:  No results found for this or any previous visit (from the past 48 hour(s)).  Blood Alcohol level:  Lab Results  Component Value Date   ETH <5 05/03/2016    Metabolic Disorder Labs: Lab Results  Component Value Date   HGBA1C 4.7 (L) 10/24/2017   MPG 88.19 10/24/2017   MPG 96.8 06/10/2017   Lab Results  Component Value Date   PROLACTIN 25.3 (H) 07/21/2017   PROLACTIN 57.7 (  H) 06/10/2017   Lab Results  Component Value Date   CHOL 290 (H) 10/24/2017   TRIG 155 (H) 10/24/2017   HDL 56 10/24/2017   CHOLHDL 5.2 10/24/2017   VLDL 31 10/24/2017   LDLCALC 203 (H) 10/24/2017   LDLCALC 117 (H) 06/10/2017    Physical Findings: AIMS:  , ,  ,  ,    CIWA:  CIWA-Ar Total: 2 COWS:     Musculoskeletal: Strength & Muscle Tone: within normal limits Gait & Station: normal Patient leans: N/A  Psychiatric Specialty Exam: Physical Exam  Constitutional: She is oriented to person, place, and time. She appears well-developed and well-nourished.  HENT:  Head: Normocephalic and atraumatic.  Respiratory: Effort normal.  Neurological: She is alert and oriented to person, place, and time.  Psychiatric:  As above    ROS  Blood pressure 134/78, pulse 93, temperature 100 F (37.8 C), temperature source Oral, resp. rate 14, height 5\' 4"  (1.626 m), weight 113.9 kg (251 lb), SpO2 100 %.Body mass index is 43.08 kg/m.  General Appearance: Well groomed, pleasant and polite. Not sweaty  Eye Contact:  Good  Speech:  Spontaneous, normal prosody. Normal tone and rate.   Volume:  Normal  Mood:  Euthymic  Affect:  Full Range and appropriate   Thought Process:  Linear  Orientation:  Full (Time, Place, and Person)  Thought Content:  No delusional theme. No preoccupation with violent thoughts. No negative ruminations. No obsession.  No hallucination in any modality.     Suicidal Thoughts:  No  Homicidal Thoughts:  No  Memory:  Immediate;   Good Recent;   Good Remote;   Good  Judgement:  Better   Insight:  Fair  Psychomotor Activity:  Normal  Concentration:  Concentration: Good and Attention Span: Good  Recall:  Good  Fund of Knowledge:  Good  Language:  Good  Akathisia:  Negative  Handed:    AIMS (if indicated):     Assets:  ArchitectCommunication Skills Financial Resources/Insurance Housing Intimacy Physical Health Resilience  ADL's:  Intact  Cognition:  WNL  Sleep:  Number of Hours: 6.25     Treatment Plan Summary: Patient is not pervasively depressed. She is not manic or psychotic. No evidence of dissociation. Her emotions has been stable today. She is tolerating medication adjustment well. We plan to optimize Gabapentin further while we wean her off benzodiazepines. We would evaluate her further and get feedback from her family. Hopeful discharge in a day or two if she maintains progress.   Psychiatric: Borderline PD  Medical:  Psychosocial:   PLAN: 1. Increase Gabapentin 400 mg TID 2. Continue to monitor mood, behavior and interaction with peers 3. Feedback from her family and hopeful discharge tomorrow.     Georgiann CockerVincent A Izediuno, MD 10/26/2017, 7:00 PMPatient ID: Brittany [name], female   DOB: [birth year], 32 y.o.   MRN: [id number]

## 2017-10-26 NOTE — [facility] Group Notes (Addendum)
[facility] 10/26/2017 1:15pm  Type of Therapy: Mental Health Association Presentation  Participation Level: attentive  Participation Quality: engaged  Affect:   Cognitive:   Insight:   Engagement in Therapy:   Modes of Intervention: Discussion, Education and Socialization  Summary of Progress/Problems: Mental Health Association (MHA) Speaker came to talk about his personal journey with mental health. The pt processed ways by which to relate to the speaker. MHA speaker provided handouts and educational information pertaining to groups and services offered by the [facility]. Pt was engaged in speaker's presentation and was receptive to resources provided.    Lorri FrederickWierda, Antoinett Dorman Jon, [name] 10/26/2017 4:07 PM

## 2017-10-26 NOTE — Progress Notes (Signed)
Recreation Therapy Notes  Date: 10/26/17 Time: 0930 Location: 300 Hall Dayroom  Group Topic: Stress Management  Goal Area(s) Addresses:  Patient will verbalize importance of using healthy stress management.  Patient will identify positive emotions associated with healthy stress management.   Behavioral Response: Engaged  Intervention: Stress Management  Activity :  Meditation.  LRT introduced the stress management group of meditation.  LRT played a meditation from the Calm app to participate in the meditation.  Patients were to listen and follow along with the meditation in order to engage in the activity.  Education:  Stress Management, Discharge Planning.   Education Outcome: Acknowledges edcuation/In group clarification offered/Needs additional education  Clinical Observations/Feedback: Pt attended group.    Avalynne Diver, LRT/CTRS         Kaleiah Kutzer A 10/26/2017 12:29 PM 

## 2017-10-27 MED ORDER — HYDROXYZINE HCL 25 MG PO TABS: 25.0000 mg | ORAL_TABLET | Freq: Three times a day (TID) | ORAL | 0 refills | Status: DC | PRN

## 2017-10-27 MED ORDER — ZIPRASIDONE HCL 20 MG PO CAPS: 20.0000 mg | ORAL_CAPSULE | Freq: Two times a day (BID) | ORAL | 0 refills | Status: DC

## 2017-10-27 MED ORDER — TRAZODONE HCL 300 MG PO TABS: 300.0000 mg | ORAL_TABLET | Freq: Every day | ORAL | 0 refills | Status: DC

## 2017-10-27 MED ORDER — GABAPENTIN 400 MG PO CAPS: 400.0000 mg | ORAL_CAPSULE | Freq: Three times a day (TID) | ORAL | 0 refills | Status: DC

## 2017-10-27 NOTE — Progress Notes (Signed)
CSW spoke with Remigio EisenmengerKaren Rudd, Care coordinator [place] and updated her on discharge plan. Garner NashGregory Daizha Anand, MSW, [name] Clinical Social Worker 10/27/2017 9:42 AM

## 2017-10-27 NOTE — Progress Notes (Signed)
  [facility] :  Will you be returning to the same living situation after discharge:  Yes,  with husband At discharge, do you have transportation home?: Yes,  husband Do you have the ability to pay for your medications: Yes,  medicaid  Release of information consent forms completed and in the chart;  Patient's signature needed at discharge.  Patient to Follow up at: Follow-up Information    [facility] Go on 11/03/2017.   Specialty:  Psychology Why:  Please attend your appointment with Cristie HemSue McKendry on Thursday, 11/03/17, at 9:00am. Contact information: [address] [place] [address] [phone]        [facility]. Go on 10/28/2017.   Why:  Please attend your appointment with Dr. Karna ChristmasHague on Friday, 10/28/17, at 9:30am. Contact information: [address], [place], [place] [address] P: [phone] F: [phone]          Next level of care provider has access to [facility] Link:no  Safety Planning and Suicide Prevention discussed: Yes,  with husband  Have you used any form of tobacco in the last 30 days? (Cigarettes, Smokeless Tobacco, Cigars, and/or Pipes): No  Has patient been referred to the Quitline?: N/A patient is not a smoker  Patient has been referred for addiction treatment: Yes  Lorri FrederickWierda, Edith Lord Jon, [name] 10/27/2017, 10:56 AM

## 2017-10-27 NOTE — [facility] Group Notes (Signed)
[facility] [name] Group Therapy Note  Date/Time: 10/27/17, 1315  Type of Therapy/Topic:  Group Therapy:  Balance in Life  Participation Level:  moderate  Description of Group:    This group will address the concept of balance and how it feels and looks when one is unbalanced. Patients will be encouraged to process areas in their lives that are out of balance, and identify reasons for remaining unbalanced. Facilitators will guide patients utilizing problem- solving interventions to address and correct the stressor making their life unbalanced. Understanding and applying boundaries will be explored and addressed for obtaining  and maintaining a balanced life. Patients will be encouraged to explore ways to assertively make their unbalanced needs known to significant others in their lives, using other group members and facilitator for support and feedback.  Therapeutic Goals: 1. Patient will identify two or more emotions or situations they have that consume much of in their lives. 2. Patient will identify signs/triggers that life has become out of balance:  3. Patient will identify two ways to set boundaries in order to achieve balance in their lives:  4. Patient will demonstrate ability to communicate their needs through discussion and/or role plays  Summary of Patient Progress: Pt identified mental/emotional and financial as current areas that are out of balance and participate in group discussion about how to identify and respond positively in these situations.          Therapeutic Modalities:   Cognitive Behavioral Therapy Solution-Focused Therapy Assertiveness Training  Daleen SquibbGreg Corderius Saraceni, [place]
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
## 2017-10-27 NOTE — Progress Notes (Signed)
Adult Psychoeducational Group Note  Date:  10/27/2017 Time:  0915 Group Topic/Focus:  Pt participated in group discussion after TED talk  Participation Level:  Active  Participation Quality:  Appropriate and Attentive  Affect:  Appropriate  Cognitive:  Appropriate  Insight: Appropriate and Good  Engagement in Group:  Engaged and Improving  Modes of Intervention:  Discussion, Education and Support  Additional Comments:    Maretta Losli, Lenus Trauger Patience 10/27/2017, 11:37 AM

## 2017-10-27 NOTE — BHH Suicide Risk Assessment (Signed)
[facility] Discharge Suicide Risk Assessment   Principal Problem: Borderline personality disorder [facility]) Discharge Diagnoses:  Patient Active Problem List   Diagnosis Date Noted  . Bipolar 1 disorder, depressed, severe (HCC) [F31.4] 10/23/2017  . Bipolar 1 disorder (HCC) [F31.9] 07/18/2017  . Bipolar disorder, curr episode mixed, severe, with psychotic features (HCC) [F31.64] 06/08/2017  . ADHD (attention deficit hyperactivity disorder) [F90.9] 09/15/2016  . Borderline personality disorder (HCC) [F60.3] 05/04/2016  . Overdose [T50.901A] 05/03/2016  . Noncompliance [Z91.19] 05/03/2016    Total Time spent with patient: 45 minutes  Musculoskeletal: Strength & Muscle Tone: within normal limits Gait & Station: normal Patient leans: N/A  Psychiatric Specialty Exam: Review of Systems  Constitutional: Negative.   HENT: Negative.   Eyes: Negative.   Respiratory: Negative.   Cardiovascular: Negative.   Gastrointestinal: Negative.   Genitourinary: Negative.   Musculoskeletal: Negative.   Skin: Negative.   Neurological: Negative.   Endo/Heme/Allergies: Negative.   Psychiatric/Behavioral: Negative for depression, hallucinations, memory loss, substance abuse and suicidal ideas. The patient is not nervous/anxious and does not have insomnia.     Blood pressure 121/75, pulse 85, temperature 97.8 F (36.6 C), temperature source Oral, resp. rate 18, height 5\' 4"  (1.626 m), weight 113.9 kg (251 lb), SpO2 100 %.Body mass index is 43.08 kg/m.  General Appearance: Neatly dressed, pleasant, engaging well and cooperative. Appropriate behavior. Not in any distress. Good relatedness. Not internally stimulated  Eye Contact::  Good  Speech:  Spontaneous, normal prosody. Normal tone and rate.   Volume:  Normal  Mood:  Euthymic  Affect:  Appropriate and Full Range  Thought Process:  Linear  Orientation:  Full (Time, Place, and Person)  Thought Content:  Future oriented. No delusional theme. No  preoccupation with violent thoughts. No negative ruminations. No obsession.  No hallucination in any modality.   Suicidal Thoughts:  No  Homicidal Thoughts:  No  Memory:  Immediate;   Good Recent;   Good Remote;   Good  Judgement:  Good  Insight:  Good  Psychomotor Activity:  Normal  Concentration:  Good  Recall:  Good  Fund of Knowledge:Good  Language: Good  Akathisia:  Negative  Handed:    AIMS (if indicated):     Assets:  Communication Skills Desire for Improvement Housing Intimacy Physical Health Resilience  Sleep:  Number of Hours: 5.75  Cognition: WNL  ADL's:  Intact   Clinical Assessment::   32 y.o Caucasian female, married, lives with her family. Background history of Bipolar disorder unspecified, [employer]. Presented to the the unit in company of her husband. Expressed increased depression lately. Expressed thoughts of suicide. Has plans to get the police shoot her . Had stopped taking all her medications. Multiple medication switching recently. Her therapist recently abandoned her. Routine labs is significant for elevated lipids, leucocytosis. Toxicology is negative,  UDS is positive for THC and Benzodiazepines. No alcohol.   Seen today. Reports that she is in good spirits. She enjoyed being around her family yesterday. She feels she is back to her normal self. No longer feeling depressed or anxious. Reports normal energy and interest. She has been maintaining normal biological functions. She is able to think clearly. She is able to focus on task. Her thoughts are not crowded or racing. No evidence of mania. No hallucination in any modality. She is not making any delusional statement. No passivity of will/thought. She is fully in touch with reality. No derealization or depersonalization.  No thoughts of suicide. No thoughts of homicide. No  violent thoughts. No access to weapons. No new stressor.   Nursing staff reports that patient has been appropriate on the unit. Patient has  been interacting well with peers. No behavioral issues. Patient has not voiced any suicidal thoughts. Patient has not been observed to be internally stimulated. Patient has been adherent with treatment recommendations. Patient has been tolerating their medication well.   Patient was discussed at team. Team members feels that patient is back to her baseline level of function. Team agrees with plan to discharge patient today.   Demographic Factors:  NA  Loss Factors: Recent abandonment by her therapist.   Historical Factors: Impulsivity  Risk Reduction Factors:   Responsible for children under 34 years of age, Sense of responsibility to family, Living with another person, especially a relative, Positive social support, Positive therapeutic relationship and Positive coping skills or problem solving skills  Continued Clinical Symptoms:  As above   Cognitive Features That Contribute To Risk:  None    Suicide Risk:  Minimal: No identifiable suicidal ideation.  Patient is not having any thoughts of suicide at this time. Modifiable risk factors targeted during this admission includes mood instability and benzodiazepine dependence. . Demographical and historical risk factors cannot be modified. Patient is now engaging well. Patient is reliable and is future oriented. We have buffered patient's support structures. At this point, patient is at low risk of suicide. Patient is aware of the effects of psychoactive substances on decision making process. Patient has been provided with emergency contacts. Patient acknowledges to use resources provided if unforseen circumstances changes their current risk stratification.    Follow-up Information    [facility]. Go on 11/03/2017.   Specialty:  Psychology Why:  Please attend your appointment with Cristie HemSue McKendry on Thursday, 11/03/17, at 9:00am. Contact information: [address] [place] [address] [phone]         [facility]. Go on 10/28/2017.   Why:  Please attend your appointment with Dr. Karna ChristmasHague on Friday, 10/28/17, at 9:30am. Contact information: [address], [place], [place] [address] P: [phone] F: [phone]          Plan Of Care/Follow-up recommendations:  1. Continue current psychotropic medications 2. Mental health and addiction follow up as arranged.  3. Discharge in care of her family 4. Provided limited quantity of prescriptions   Georgiann CockerVincent A Macenzie Burford, MD 10/27/2017, 11:01 AM

## 2017-10-27 NOTE — Progress Notes (Signed)
Pt received both written and verbal discharge instructions. Pt verbalized understanding of discharge instructions. Pt agreed to f/u appt and med regimen. Pt received d/c documents and prescriptions. Pt gathered belongings from room and locker. Pt safely discharged to the lobby.

## 2017-11-23 ENCOUNTER — Other Ambulatory Visit: Payer: Self-pay

## 2017-11-23 ENCOUNTER — Inpatient Hospital Stay (HOSPITAL_COMMUNITY)
Admission: RE | Admit: 2017-11-23 | Discharge: 2017-11-27 | DRG: 885 | Disposition: A | Discharge status: Home / Self Care | Payer: Medicaid Other | Attending: Psychiatry | Admitting: Psychiatry

## 2017-11-23 ENCOUNTER — Encounter (HOSPITAL_COMMUNITY): Payer: Self-pay

## 2017-11-23 DIAGNOSIS — F332 Major depressive disorder, recurrent severe without psychotic features: Secondary | ICD-10-CM | POA: Diagnosis present

## 2017-11-23 DIAGNOSIS — Z813 Family history of other psychoactive substance abuse and dependence: Secondary | ICD-10-CM | POA: Diagnosis not present

## 2017-11-23 DIAGNOSIS — Z915 Personal history of self-harm: Secondary | ICD-10-CM

## 2017-11-23 DIAGNOSIS — Z79899 Other long term (current) drug therapy: Secondary | ICD-10-CM | POA: Diagnosis not present

## 2017-11-23 DIAGNOSIS — R45851 Suicidal ideations: Secondary | ICD-10-CM | POA: Diagnosis present

## 2017-11-23 DIAGNOSIS — Z87891 Personal history of nicotine dependence: Secondary | ICD-10-CM | POA: Diagnosis not present

## 2017-11-23 DIAGNOSIS — Z9049 Acquired absence of other specified parts of digestive tract: Secondary | ICD-10-CM

## 2017-11-23 DIAGNOSIS — F410 Panic disorder [episodic paroxysmal anxiety] without agoraphobia: Secondary | ICD-10-CM | POA: Diagnosis present

## 2017-11-23 DIAGNOSIS — Z818 Family history of other mental and behavioral disorders: Secondary | ICD-10-CM | POA: Diagnosis not present

## 2017-11-23 DIAGNOSIS — F603 Borderline personality disorder: Secondary | ICD-10-CM | POA: Diagnosis not present

## 2017-11-23 DIAGNOSIS — F3164 Bipolar disorder, current episode mixed, severe, with psychotic features: Secondary | ICD-10-CM | POA: Diagnosis not present

## 2017-11-23 DIAGNOSIS — F909 Attention-deficit hyperactivity disorder, unspecified type: Secondary | ICD-10-CM | POA: Diagnosis present

## 2017-11-23 DIAGNOSIS — R4587 Impulsiveness: Secondary | ICD-10-CM | POA: Diagnosis not present

## 2017-11-23 DIAGNOSIS — F1290 Cannabis use, unspecified, uncomplicated: Secondary | ICD-10-CM | POA: Diagnosis present

## 2017-11-23 LAB — URINALYSIS, COMPLETE (UACMP) WITH MICROSCOPIC
BACTERIA UA: NONE SEEN
Bilirubin Urine: NEGATIVE
Glucose, UA: NEGATIVE mg/dL
Ketones, ur: NEGATIVE mg/dL
Leukocytes, UA: NEGATIVE
NITRITE: NEGATIVE
PROTEIN: NEGATIVE mg/dL
RBC / HPF: NONE SEEN RBC/hpf (ref 0–5)
Specific Gravity, Urine: 1.018 (ref 1.005–1.030)
WBC UA: NONE SEEN WBC/hpf (ref 0–5)
pH: 5 (ref 5.0–8.0)

## 2017-11-23 LAB — RAPID URINE DRUG SCREEN, HOSP PERFORMED
AMPHETAMINES: NOT DETECTED
Barbiturates: NOT DETECTED
Benzodiazepines: POSITIVE — AB
COCAINE: NOT DETECTED
OPIATES: NOT DETECTED
Tetrahydrocannabinol: POSITIVE — AB

## 2017-11-23 LAB — PREGNANCY, URINE: Preg Test, Ur: NEGATIVE

## 2017-11-23 MED ORDER — GABAPENTIN 400 MG PO CAPS
400.0000 mg | ORAL_CAPSULE | Freq: Three times a day (TID) | ORAL | Status: DC
  Administered 2017-11-23 – 2017-11-27 (×11): 400 mg via ORAL
  Filled 2017-11-23 (×19): qty 1

## 2017-11-23 MED ORDER — HYDROXYZINE HCL 25 MG PO TABS
25.0000 mg | ORAL_TABLET | Freq: Three times a day (TID) | ORAL | Status: DC | PRN
  Administered 2017-11-23: 25 mg via ORAL
  Filled 2017-11-23 (×2): qty 1

## 2017-11-23 MED ORDER — ZIPRASIDONE HCL 40 MG PO CAPS
40.0000 mg | ORAL_CAPSULE | Freq: Two times a day (BID) | ORAL | Status: DC
  Administered 2017-11-23 – 2017-11-27 (×8): 40 mg via ORAL
  Filled 2017-11-23 (×8): qty 1
  Filled 2017-11-23: qty 2
  Filled 2017-11-23 (×4): qty 1

## 2017-11-23 MED ORDER — MAGNESIUM HYDROXIDE 400 MG/5ML PO SUSP: 30.0000 mL | Freq: Every day | ORAL | Status: DC | PRN

## 2017-11-23 MED ORDER — TRAZODONE HCL 100 MG PO TABS
100.0000 mg | ORAL_TABLET | Freq: Every evening | ORAL | Status: DC | PRN
  Filled 2017-11-23 (×2): qty 1

## 2017-11-23 MED ORDER — ALUM & MAG HYDROXIDE-SIMETH 200-200-20 MG/5ML PO SUSP: 30.0000 mL | ORAL | Status: DC | PRN

## 2017-11-23 MED ORDER — TRAZODONE HCL 150 MG PO TABS
300.0000 mg | ORAL_TABLET | Freq: Every evening | ORAL | Status: DC | PRN
  Administered 2017-11-23 – 2017-11-24 (×2): 300 mg via ORAL
  Filled 2017-11-23 (×2): qty 2

## 2017-11-23 MED ORDER — ACETAMINOPHEN 325 MG PO TABS: 650.0000 mg | ORAL_TABLET | Freq: Four times a day (QID) | ORAL | Status: DC | PRN

## 2017-11-23 NOTE — H&P (Signed)
Behavioral Health Medical Screening Exam  Brittany [name] is an 32 y.o. female patient presents as a walk-in at [facility] with complaints of suicidal ideation and plan to hang herself.  Patient is unable to contract for safety  Total Time spent with patient: 30 minutes  Psychiatric Specialty Exam: Physical Exam  Vitals reviewed. Constitutional: She is oriented to person, place, and time. She appears well-nourished.  HENT:  Head: Normocephalic.  Neck: Normal range of motion.  Respiratory: Effort normal.  Musculoskeletal: Normal range of motion.  Neurological: She is alert and oriented to person, place, and time.  Skin: Skin is warm and dry.    Review of Systems  Psychiatric/Behavioral: Positive for depression and suicidal ideas. Negative for hallucinations and substance abuse. The patient is nervous/anxious.   All other systems reviewed and are negative.   Blood pressure 113/79, pulse 97, temperature 98.2 F (36.8 C), resp. rate 16, SpO2 98 %.There is no height or weight on file to calculate BMI.  General Appearance: Casual  Eye Contact:  Good  Speech:  Clear and Coherent and Normal Rate  Volume:  Normal  Mood:  Depressed  Affect:  Depressed, Flat and Tearful  Thought Process:  Coherent and Goal Directed  Orientation:  Full (Time, Place, and Person)  Thought Content:  Logical  Suicidal Thoughts:  Yes.  with intent/plan  Homicidal Thoughts:  No  Memory:  Immediate;   Good Recent;   Good Remote;   Good  Judgement:  Impaired  Insight:  Lacking  Psychomotor Activity:  Normal  Concentration: Concentration: Good and Attention Span: Good  Recall:  Good  Fund of Knowledge:Good  Language: Good  Akathisia:  No  Handed:  Right  AIMS (if indicated):     Assets:  Communication Skills Desire for Improvement Housing Social Support  Sleep:       Musculoskeletal: Strength & Muscle Tone: within normal limits Gait & Station: normal Patient leans: N/A  Blood pressure 113/79,  pulse 97, temperature 98.2 F (36.8 C), resp. rate 16, SpO2 98 %.  Recommendations:  Inpatient psychiatric treatment.  [facility]  Based on my evaluation the patient does not appear to have an emergency medical condition.  Shuvon Rankin, NP 11/23/2017, 2:30 PM

## 2017-11-23 NOTE — Progress Notes (Signed)
Patient came to medication window and stated, "I hope they put me back on my xanax because I was taking it at home."  Informed her that she was restarted on any benzos. When she was here on last admit, she was detoxed off benzos and evidently started them again.  Her UDS is pending.  Telepsyche note states patient's therapist stopped treating her in December.  She get her medications from her PCP.  Patient states, "well I hope I don't have seizures because I'm going to tell my husband and you will have a lawsuit on your hands."  Informed patient that I would report this off to night nurse and make a note in her chart.  Patient was satisfied with that answer.

## 2017-11-23 NOTE — Tx Team (Signed)
Initial Treatment Plan 11/23/2017 6:38 PM Brittany FasterChristine H Galicia ZOX:[id number]    PATIENT STRESSORS: Financial difficulties Medication change or noncompliance   PATIENT STRENGTHS: Communication skills General fund of knowledge Physical Health   PATIENT IDENTIFIED PROBLEMS: Depression  Suicidal ideation  "Let this medication get in my system so I can proceed with my normal life"  "I don't want to depend on anybody to pull me out"               DISCHARGE CRITERIA:  Improved stabilization in mood, thinking, and/or behavior Verbal commitment to aftercare and medication compliance  PRELIMINARY DISCHARGE PLAN: Outpatient therapy Medication management  PATIENT/FAMILY INVOLVEMENT: This treatment plan has been presented to and reviewed with the patient, Brittany Fasterhristine H Nie.  The patient and family have been given the opportunity to ask questions and make suggestions.  Levin BaconHeather V Jarron Curley, RN 11/23/2017, 6:38 PM
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
## 2017-11-23 NOTE — Progress Notes (Signed)
[name] is a 32 year old female being admitted voluntarily to 401-1 as [facility].  She came in with suicidal ideation.  She recently saw her PCP and they increased her Geodon to 40mg  but she was worried that she wouldn't be able to remain safe.  She feels like she needed to be admitted to remain safe while waiting on the medications to get in her system.  She reports passive SI and will contract for safety on the unit.  She denies HI or A/V hallucinations.  Oriented her to the unit.  Admission paperwork completed and signed.  Belongings searched and secured in locker # 35, no contraband found.  Skin assessment completed and multiple tattoos and old cuts on upper right leg.  Q 15 minute checks initiated for safety.  We will continue to monitor the progress towards her goals.

## 2017-11-24 LAB — COMPREHENSIVE METABOLIC PANEL
ALBUMIN: 3.9 g/dL (ref 3.5–5.0)
ALK PHOS: 65 U/L (ref 38–126)
ALT: 15 U/L (ref 14–54)
AST: 22 U/L (ref 15–41)
Anion gap: 10 (ref 5–15)
BILIRUBIN TOTAL: 0.3 mg/dL (ref 0.3–1.2)
BUN: 15 mg/dL (ref 6–20)
CALCIUM: 8.9 mg/dL (ref 8.9–10.3)
CO2: 21 mmol/L — ABNORMAL LOW (ref 22–32)
Chloride: 106 mmol/L (ref 101–111)
Creatinine, Ser: 0.94 mg/dL (ref 0.44–1.00)
GFR calc Af Amer: >60 mL/min (ref >60)
GLUCOSE: 155 mg/dL — AB (ref 65–99)
Potassium: 3.5 mmol/L (ref 3.5–5.1)
Sodium: 137 mmol/L (ref 135–145)
Total Protein: 7.1 g/dL (ref 6.5–8.1)

## 2017-11-24 LAB — CBC
HEMATOCRIT: 40.1 % (ref 36.0–46.0)
HEMOGLOBIN: 13.6 g/dL (ref 12.0–15.0)
MCH: 31.3 pg (ref 26.0–34.0)
MCHC: 33.9 g/dL (ref 30.0–36.0)
MCV: 92.2 fL (ref 78.0–100.0)
Platelets: 179 10*3/uL (ref 150–400)
RBC: 4.35 MIL/uL (ref 3.87–5.11)
RDW: 13.2 % (ref 11.5–15.5)
WBC: 8.4 10*3/uL (ref 4.0–10.5)

## 2017-11-24 LAB — HEMOGLOBIN A1C
HEMOGLOBIN A1C: 4.9 % (ref 4.8–5.6)
Mean Plasma Glucose: 93.93 mg/dL

## 2017-11-24 LAB — LIPID PANEL
Cholesterol: 238 mg/dL — ABNORMAL HIGH (ref 0–200)
HDL: 55 mg/dL (ref >40)
LDL CALC: 141 mg/dL — AB (ref 0–99)
TRIGLYCERIDES: 210 mg/dL — AB (ref <150)
Total CHOL/HDL Ratio: 4.3 RATIO
VLDL: 42 mg/dL — AB (ref 0–40)

## 2017-11-24 LAB — ETHANOL: Alcohol, Ethyl (B): <10 mg/dL (ref <10)

## 2017-11-24 LAB — TSH: TSH: 1.987 u[IU]/mL (ref 0.350–4.500)

## 2017-11-24 MED ORDER — HYDROXYZINE HCL 50 MG PO TABS
50.0000 mg | ORAL_TABLET | Freq: Three times a day (TID) | ORAL | Status: DC | PRN
  Administered 2017-11-24 – 2017-11-27 (×6): 50 mg via ORAL
  Filled 2017-11-24 (×7): qty 1

## 2017-11-24 NOTE — [facility] Group Notes (Signed)
Adult Psychoeducational Group Note  Date:  11/24/2017 Time:  9:17 AM  Group Topic/Focus:  Goals Group:   The focus of this group is to help patients establish daily goals to achieve during treatment and discuss how the patient can incorporate goal setting into their daily lives to aide in recovery.  Participation Level:  Active  Participation Quality:  Appropriate  Affect:  Appropriate  Cognitive:  Alert  Insight: Appropriate  Engagement in Group:  Engaged  Modes of Intervention:  Orientation  Additional Comments:  Pt participated in goals/orientation group. Pt goal for today is to survive and make it through the day.   Brittany [name] 11/24/2017, 9:17 AM

## 2017-11-24 NOTE — Progress Notes (Signed)
Pt presents with an animated affect and a labile mood. Pt noted to be irritable on approach due to not having Xanax available for anxiety. Pt reported increased anxiety this morning and stated that she was staying in her room to help cope with her anxiety. Pt reported that she's tolerating her meds well and denies any side effects to Geodon. Pt reports decreased depression 4/10. Pt denies SI/HI. Pt reports fair sleep last night after taking Trazodone. Orders reviewed with pt. Verbal support provided. Pt encouraged to attend groups. 15 minute checks performed safety.

## 2017-11-24 NOTE — [facility] Group Notes (Signed)
Adult Psychoeducational Group Note  Date:  11/24/2017 Time:  10:45 AM  Group Topic/Focus:  Video  Participation Level:  Active  Participation Quality:  Appropriate  Affect:  Appropriate  Cognitive:  Appropriate  Insight: Appropriate  Engagement in Group:  Engaged  Modes of Intervention:  Activity  Additional Comments:  Pt was alert and engaged in therapeutic television topic on vulnerability.  Dellia NimsJaquesha M Kiyah Demartini 11/24/2017, 10:45 AM

## 2017-11-24 NOTE — [facility] Suicide Risk Assessment (Signed)
[facility] INPATIENT:  Family/Significant Other Suicide Prevention Education  Suicide Prevention Education:  Patient Refusal for Family/Significant Other Suicide Prevention Education: The patient Brittany [name] has refused to provide written consent for family/significant other to be provided Family/Significant Other Suicide Prevention Education during admission and/or prior to discharge.  Physician notified.  Metro Kungngel M Aahna Rossa 11/24/2017, 1:25 PM
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
## 2017-11-24 NOTE — Progress Notes (Signed)
Pt is new to the unit this evening, but [facility].  She reports that her PCP had increased her Geodon to 40 mg and she was anxious that she may need to be monitored for safety.  She said she was afraid that she might hurt herself, so she came to the hospital.  While writer talked with pt, it was apparent that she was mainly focused on her meds, and was insistent that the xanax she is taking at home is the only med that helps her anxiety and sleep.  Pt was not happy that she would not get the xanax while here, but Clinical research associatewriter asked pt if she would be satisfied with the meds she was taking while here in January.  Pt was agreeable.  Writer spoke with the evening provider who was able to accommodate the pt with her home dose of Trazodone.  Pt told writer that when she is discharged, she will continue the xanax through her PCP.  Pt became more pleasant after our conversation.  She took her meds and went to bed without any issues.  Pt denies SI/HI/AVH at this time.  Support and encouragement offered.  Discharge plans are in process.  Safety maintained with q15 minute checks.

## 2017-11-24 NOTE — [facility] Group Notes (Signed)
[name] Group Therapy Note 11/24/2017 2:50 PM  Type of Therapy/Topic: Group Therapy: Balance in Life  Participation Level: Active  Description of Group:  This group will address the concept of balance and how it feels and looks when one is unbalanced. Patients will be encouraged to process areas in their lives that are out of balance and identify reasons for remaining unbalanced. Facilitators will guide patients in utilizing problem-solving interventions to address and correct the stressor making their life unbalanced. Understanding and applying boundaries will be explored and addressed for obtaining and maintaining a balanced life. Patients will be encouraged to explore ways to assertively make their unbalanced needs known to significant others in their lives, using other group members and facilitator for support and feedback.  Therapeutic Goals: 1. Patient will identify two or more emotions or situations they have that consume much of in their lives. 2. Patient will identify signs/triggers that life has become out of balance:  3. Patient will identify two ways to set boundaries in order to achieve balance in their lives:  4. Patient will demonstrate ability to communicate their needs through discussion and/or role plays  Summary of Patient Progress:  Brittany [name] was engaged throughout the group session. She participated and contributed to the group's discussion. Brittany [name] stated she plans to create more structure in her life so that she can maintain balance in her life.   Therapeutic Modalities:  Cognitive Behavioral Therapy Solution-Focused Therapy Assertiveness Training   Brittany [name] LCSWA Clinical Social Worker

## 2017-11-24 NOTE — H&P (Signed)
Psychiatric Admission Assessment Adult  Patient Identification: Brittany [name] MRN:  [id number] Date of Evaluation:  11/24/2017 Chief Complaint:  Suicidal thoughts Principal Diagnosis: Borderline Personality Disorder Diagnosis:   Patient Active Problem List   Diagnosis Date Noted  . MDD (major depressive disorder), recurrent severe, without psychosis ([place]) [F33.2] 11/23/2017  . Bipolar 1 disorder, depressed, severe ([place]) [F31.4] 10/23/2017  . Bipolar 1 disorder ([place]) [F31.9] 07/18/2017  . Bipolar disorder, curr episode mixed, severe, with psychotic features ([place]) [F31.64] 06/08/2017  . ADHD (attention deficit hyperactivity disorder) [F90.9] 09/15/2016  . Borderline personality disorder ([place]) [F60.3] 05/04/2016  . Overdose [T50.901A] 05/03/2016  . Noncompliance [Z91.19] 05/03/2016   History of Present Illness: 32 y.o Caucasian female, married, lives with her family. Background history of Bipolar disorder unspecified, BLPD. Presented to the the unit in unaccompanied . Says she was referred by her psychiatrist. Reports feeling depressed lately. She was having suicidal thoughts. Says her her psychiatrist increased her medication and advised her to come into the hospital. Routine labs is significant for elevated lipids.. Toxicology is negative,  UDS for THC and Benzodiazepines. No alcohol.   At interview, patient denies any new stressors. Says she has these mood swings cyclically. Says she is disappointed she had to be in the hospital on Valentines day. Says her husband would be visiting her later today. She reports periods she feels horrible. Would hear her own thoughts criticize her. Says she has not had that lately.  No hallucination in any modality. No other form of perceptual abnormality. No feelings of things being unreal. No feeling of impending doom. Not expressing any delusional statement. No current suicidal thoughts. No homicidal thoughts. No thoughts of violence. She was initially  fixated on being prescribed Alprazolam. Says she has been feeling much better since she got here. Says this is like a second home for her. She feels comfortable here because she is familiar with the routines and staff here.  Total Time spent with patient: 1 hour  Past Psychiatric History: Multiple admissions here. She was discharged a couple of weeks ago. As per old notes.  reports history of multiple psychiatric admissions starting in her early 65s. States she has been diagnosed with Bipolar Disorder, ADHD. Reports prior psychotic symptoms but not recently .   Reports  history of prior suicide attempts , last attempted suicide in 2018, by overdoing.  History of self cutting, states she last cut 2 weeks ago. Denies history of violence    Is the patient at risk to self? No.  Has the patient been a risk to self in the past 6 months? Yes.    Has the patient been a risk to self within the distant past? Yes.    Is the patient a risk to others? No.  Has the patient been a risk to others in the past 6 months? No.  Has the patient been a risk to others within the distant past? No.   Prior Inpatient Therapy: Prior Inpatient Therapy: Yes Prior Therapy Dates: 10/2017 Prior Therapy Facilty/Provider(s): [facility], [facility] Reason for Treatment: Bipolar disorder Prior Outpatient Therapy: Prior Outpatient Therapy: Yes Prior Therapy Dates: 2018(therapist terminated tx 09/2017) Prior Therapy Facilty/Provider(s): [facility] Reason for Treatment: Bipolar disorder Does patient have an ACCT team?: No Does patient have Intensive In-House Services?  : No Does patient have [facility] services? : No Does patient have P4CC services?: No  Alcohol Screening: 1. How often do you have a drink containing alcohol?: Monthly or less 2. How many drinks containing  alcohol do you have on a typical day when you are drinking?: 5 or 6 3. How often do you have six or more drinks on one occasion?: Never AUDIT-C Score: 3 4. How often  during the last year have you found that you were not able to stop drinking once you had started?: Never 5. How often during the last year have you failed to do what was normally expected from you becasue of drinking?: Never 6. How often during the last year have you needed a first drink in the morning to get yourself going after a heavy drinking session?: Never 7. How often during the last year have you had a feeling of guilt of remorse after drinking?: Never 8. How often during the last year have you been unable to remember what happened the night before because you had been drinking?: Never 9. Have you or someone else been injured as a result of your drinking?: No 10. Has a relative or friend or a doctor or another health worker been concerned about your drinking or suggested you cut down?: No Alcohol Use Disorder Identification Test Final Score (AUDIT): 3 Intervention/Follow-up: AUDIT Score <7 follow-up not indicated Substance Abuse History in the last 12 months:  Yes.   Consequences of Substance Abuse: NA Previous Psychotropic Medications: Yes  Psychological Evaluations: Yes  Past Medical History:  Past Medical History:  Diagnosis Date  . ADHD (attention deficit hyperactivity disorder)   . Anxiety   . Arthritis    right shoulder, bilateral knees  . Bipolar disorder ([place])   . Bipolar I disorder, most recent episode depressed ([place])   . Chronic kidney disease    Kidney stones  . Depression   . Headache     Past Surgical History:  Procedure Laterality Date  . CESAREAN SECTION    . CHOLECYSTECTOMY  2017  . FOOT SURGERY    . KIDNEY SURGERY    . [place]  2010  . SHOULDER SURGERY Right   . TUBAL LIGATION     Family History:  Family History  Problem Relation Age of Onset  . Anxiety disorder Mother   . Depression Mother   . Drug abuse Mother   . Bipolar disorder Mother   . Depression Father   . Anxiety disorder Father   . Drug abuse Father   . Anxiety disorder  Sister   . Depression Sister   . ADD / ADHD Sister   . Anxiety disorder Sister   . Depression Sister    Family Psychiatric  History: see old notes  Tobacco Screening: Have you used any form of tobacco in the last 30 days? (Cigarettes, Smokeless Tobacco, Cigars, and/or Pipes): No Social History:  Social History   Substance and Sexual Activity  Alcohol Use Yes  . Alcohol/week: 0.0 oz   Comment: pt repeorts maybe drinks once a month     Social History   Substance and Sexual Activity  Drug Use Yes  . Types: Marijuana   Comment: pt reports THC oil, vapes daily    Additional Social History: Marital status: Married    Pain Medications: See MAR Prescriptions: See MAR Over the Counter: See MAR Longest period of sobriety (when/how long): One year Name of Substance 1: Marijuana 1 - Age of First Use: 16 1 - Amount (size/oz): One joint 1 - Frequency: Daily 1 - Duration: Ongoing 1 - Last Use / Amount: 11/23/17  Allergies:   Allergies  Allergen Reactions  . Ceclor [Cefaclor] Anaphylaxis, Hives and Nausea And Vomiting  . [place]abi Malena Edman Rolan Lipa) Anaphylaxis  . Adhesive [Tape] Other (See Comments)    Red, blisters   Lab Results:  Results for orders placed or performed during the hospital encounter of 11/23/17 (from the past 48 hour(s))  Urinalysis, Complete w Microscopic     Status: Abnormal   Collection Time: 11/23/17  4:15 PM  Result Value Ref Range   Color, Urine YELLOW YELLOW   APPearance TURBID (A) CLEAR   Specific Gravity, Urine 1.018 1.005 - 1.030   pH 5.0 5.0 - 8.0   Glucose, UA NEGATIVE NEGATIVE mg/dL   Hgb urine dipstick SMALL (A) NEGATIVE   Bilirubin Urine NEGATIVE NEGATIVE   Ketones, ur NEGATIVE NEGATIVE mg/dL   Protein, ur NEGATIVE NEGATIVE mg/dL   Nitrite NEGATIVE NEGATIVE   Leukocytes, UA NEGATIVE NEGATIVE   RBC / HPF NONE SEEN 0 - 5 RBC/hpf   WBC, UA NONE SEEN 0 - 5 WBC/hpf   Bacteria, UA NONE SEEN NONE SEEN   Squamous Epithelial  / LPF 6-30 (A) NONE SEEN   Mucus PRESENT     Comment: Performed at [facility], [place] [address], [place], [place] [address]  Urine rapid drug screen (hosp performed)not at [facility]     Status: Abnormal   Collection Time: 11/23/17  4:15 PM  Result Value Ref Range   Opiates NONE DETECTED NONE DETECTED   Cocaine NONE DETECTED NONE DETECTED   Benzodiazepines POSITIVE (A) NONE DETECTED   Amphetamines NONE DETECTED NONE DETECTED   Tetrahydrocannabinol POSITIVE (A) NONE DETECTED   Barbiturates NONE DETECTED NONE DETECTED    Comment: (NOTE) DRUG SCREEN FOR MEDICAL PURPOSES ONLY.  IF CONFIRMATION IS NEEDED FOR ANY PURPOSE, NOTIFY LAB WITHIN 5 DAYS. LOWEST DETECTABLE LIMITS FOR URINE DRUG SCREEN Drug Class                     Cutoff (ng/mL) Amphetamine and metabolites    1000 Barbiturate and metabolites    200 Benzodiazepine                 660 Tricyclics and metabolites     300 Opiates and metabolites        300 Cocaine and metabolites        300 THC                            50 Performed at [facility], [place] [address] [place], [place] [address]   Pregnancy, urine     Status: None   Collection Time: 11/23/17  4:15 PM  Result Value Ref Range   Preg Test, Ur NEGATIVE NEGATIVE    Comment:        THE SENSITIVITY OF THIS METHODOLOGY IS >20 mIU/mL. Performed at [facility], [place] [address], [place], [place] [address]   CBC     Status: None   Collection Time: 11/24/17  7:21 AM  Result Value Ref Range   WBC 8.4 4.0 - 10.5 K/uL   RBC 4.35 3.87 - 5.11 MIL/uL   Hemoglobin 13.6 12.0 - 15.0 g/dL   HCT 40.1 36.0 - 46.0 %   MCV 92.2 78.0 - 100.0 fL   MCH 31.3 26.0 - 34.0 pg   MCHC 33.9 30.0 - 36.0 g/dL   RDW 13.2 11.5 - 15.5 %   Platelets 179 150 -  400 K/uL    Comment: Performed at [facility], [place] [address], [place], [place] [address]  Comprehensive metabolic panel     Status: Abnormal   Collection Time:  11/24/17  7:21 AM  Result Value Ref Range   Sodium 137 135 - 145 mmol/L   Potassium 3.5 3.5 - 5.1 mmol/L   Chloride 106 101 - 111 mmol/L   CO2 21 (L) 22 - 32 mmol/L   Glucose, Bld 155 (H) 65 - 99 mg/dL   BUN 15 6 - 20 mg/dL   Creatinine, Ser 0.94 0.44 - 1.00 mg/dL   Calcium 8.9 8.9 - 10.3 mg/dL   Total Protein 7.1 6.5 - 8.1 g/dL   Albumin 3.9 3.5 - 5.0 g/dL   AST 22 15 - 41 U/L   ALT 15 14 - 54 U/L   Alkaline Phosphatase 65 38 - 126 U/L   Total Bilirubin 0.3 0.3 - 1.2 mg/dL   GFR calc non Af Amer >60 >60 mL/min   GFR calc Af Amer >60 >60 mL/min    Comment: (NOTE) The eGFR has been calculated using the CKD EPI equation. This calculation has not been validated in all clinical situations. eGFR's persistently <60 mL/min signify possible Chronic Kidney Disease.    Anion gap 10 5 - 15    Comment: Performed at [facility], [place] [address] [place], [place] [address]  Hemoglobin A1c     Status: None   Collection Time: 11/24/17  7:21 AM  Result Value Ref Range   Hgb A1c MFr Bld 4.9 4.8 - 5.6 %    Comment: (NOTE) Pre diabetes:          5.7%-6.4% Diabetes:              >6.4% Glycemic control for   <7.0% adults with diabetes    Mean Plasma Glucose 93.93 mg/dL    Comment: Performed at [facility] [address], [place], [place] [address]  Ethanol     Status: None   Collection Time: 11/24/17  7:21 AM  Result Value Ref Range   Alcohol, Ethyl (B) <10 <10 mg/dL    Comment:        LOWEST DETECTABLE LIMIT FOR SERUM ALCOHOL IS 10 mg/dL FOR MEDICAL PURPOSES ONLY Performed at [facility], [place] [address], [place],  [address]   Lipid panel     Status: Abnormal   Collection Time: 11/24/17  7:21 AM  Result Value Ref Range   Cholesterol 238 (H) 0 - 200 mg/dL   Triglycerides 210 (H) <150 mg/dL   HDL 55 >40 mg/dL   Total CHOL/HDL Ratio 4.3 RATIO   VLDL 42 (H) 0 - 40 mg/dL   LDL Cholesterol 141 (H) 0 - 99 mg/dL    Comment:         Total Cholesterol/HDL:CHD Risk Coronary Heart Disease Risk Table                     Men   Women  1/2 Average Risk   3.4   3.3  Average Risk       5.0   4.4  2 X Average Risk   9.6   7.1  3 X Average Risk  23.4   11.0        Use the calculated Patient Ratio above and the CHD Risk Table to determine the patient's CHD Risk.        ATP III CLASSIFICATION (LDL):  <  100     mg/dL   Optimal  100-129  mg/dL   Near or Above                    Optimal  130-159  mg/dL   Borderline  160-189  mg/dL   High  >190     mg/dL   Very High Performed at [place] [address] [place], [place] [address]   TSH     Status: None   Collection Time: 11/24/17  7:21 AM  Result Value Ref Range   TSH 1.987 0.350 - 4.500 uIU/mL    Comment: Performed by a 3rd Generation assay with a functional sensitivity of <=0.01 uIU/mL. Performed at [facility], [place] [address] [place], [place] [address]     Blood Alcohol level:  Lab Results  Component Value Date   ETH <10 11/24/2017   ETH <5 16/57/9038    Metabolic Disorder Labs:  Lab Results  Component Value Date   HGBA1C 4.9 11/24/2017   MPG 93.93 11/24/2017   MPG 88.19 10/24/2017   Lab Results  Component Value Date   PROLACTIN 25.3 (H) 07/21/2017   PROLACTIN 57.7 (H) 06/10/2017   Lab Results  Component Value Date   CHOL 238 (H) 11/24/2017   TRIG 210 (H) 11/24/2017   HDL 55 11/24/2017   CHOLHDL 4.3 11/24/2017   VLDL 42 (H) 11/24/2017   LDLCALC 141 (H) 11/24/2017   LDLCALC 203 (H) 10/24/2017    Current Medications: Current Facility-Administered Medications  Medication Dose Route Frequency Provider Last Rate Last Dose  . acetaminophen (TYLENOL) tablet 650 mg  650 mg Oral Q6H PRN Rankin, Shuvon B, NP      . alum & mag hydroxide-simeth (MAALOX/MYLANTA) 200-200-20 MG/5ML suspension 30 mL  30 mL Oral Q4H PRN Rankin, Shuvon B, NP      . gabapentin (NEURONTIN) capsule 400 mg  400 mg Oral TID Rankin, Shuvon  B, NP   400 mg at 11/24/17 1200  . hydrOXYzine (ATARAX/VISTARIL) tablet 25 mg  25 mg Oral TID PRN Rankin, Shuvon B, NP   25 mg at 11/23/17 2121  . magnesium hydroxide (MILK OF MAGNESIA) suspension 30 mL  30 mL Oral Daily PRN Rankin, Shuvon B, NP      . traZODone (DESYREL) tablet 300 mg  300 mg Oral QHS PRN,MR X 1 Laverle Hobby, PA-C   300 mg at 11/23/17 2137  . ziprasidone (GEODON) capsule 40 mg  40 mg Oral BID WC Rankin, Shuvon B, NP   40 mg at 11/24/17 3338   PTA Medications: Medications Prior to Admission  Medication Sig Dispense Refill Last Dose  . alprazolam (XANAX) 2 MG tablet Take 2 mg by mouth 3 (three) times daily as needed for sleep.   11/23/2017 at Unknown time  . traZODone (DESYREL) 300 MG tablet Take 1 tablet (300 mg total) by mouth at bedtime. 30 tablet 0 11/22/2017 at Unknown time  . ziprasidone (GEODON) 20 MG capsule Take 1 capsule (20 mg total) by mouth 2 (two) times daily with a meal. For mood control 60 capsule 0 11/23/2017 at Unknown time  . gabapentin (NEURONTIN) 400 MG capsule Take 1 capsule (400 mg total) by mouth 3 (three) times daily. For agitation (Patient not taking: Reported on 11/23/2017) 90 capsule 0 Not Taking at Unknown time  . hydrOXYzine (ATARAX/VISTARIL) 25 MG tablet Take 1 tablet (25 mg total) by mouth 3 (three) times daily as needed for anxiety. (Patient not  taking: Reported on 11/23/2017) 30 tablet 0 Not Taking at Unknown time    Musculoskeletal: Strength & Muscle Tone: within normal limits Gait & Station: normal Patient leans: N/A  Psychiatric Specialty Exam: Physical Exam  Constitutional: She is oriented to person, place, and time. She appears well-developed and well-nourished.  HENT:  Head: Normocephalic and atraumatic.  Respiratory: Effort normal and breath sounds normal.  Neurological: She is alert and oriented to person, place, and time.  Psychiatric:  As above    ROS  Blood pressure 127/79, pulse 98, temperature 98.1 F (36.7 C), temperature  source Oral, resp. rate 18, height '5\' 5"'  (1.651 m), weight 117 kg (258 lb), SpO2 98 %.Body mass index is 42.93 kg/m.  General Appearance: Well groomed, vibrant and engaging. Histrionic.   Eye Contact:  Good  Speech:  Clear and Coherent and Normal Rate  Volume:  Normal  Mood:  Not objectively depressed.   Affect:  Shallow   Thought Process:  Linear  Orientation:  Full (Time, Place, and Person)  Thought Content:  No delusional theme. No preoccupation with violent thoughts. No negative ruminations. No obsession.  No hallucination in any modality.   Suicidal Thoughts:  None currently  Homicidal Thoughts:  No  Memory:  Immediate;   Good Recent;   Good Remote;   Good  Judgement:  Fair  Insight:  Fair  Psychomotor Activity:  Normal  Concentration:  Concentration: Good and Attention Span: Good  Recall:  Good  Fund of Knowledge:  Good  Language:  Good  Akathisia:  Negative  Handed:    AIMS (if indicated):     Assets:  Communication Skills Desire for Improvement Housing Intimacy Resilience Transportation  ADL's:  Intact  Cognition:  WNL  Sleep:  Number of Hours: 5.75    Treatment Plan Summary: Borderline PD presenting with suicidal thoughts. She feels contained here. She feels safer and has not had any suicidal thoughts since she has been here. We have agreed to continue her outpatient medications. We plan to gather more information and evaluate her further.   Psychiatric: Borderline PD  Medical:  Psychosocial:   PLAN: 1. Continue Geodon 40 mg BID 2. Hydroxyzine PRN for anxiety 3. Trazodone 300 mg HS 4. Librium PRN for withdrawals 5. Continue to monitor mood, behavior and interaction with peers 6. Continue to encourage unit groups and therapeutic activity 7. SW would gather collateral from her family.      Observation Level/Precautions:  15 minute checks  Laboratory:    Psychotherapy:    Medications:    Consultations:    Discharge Concerns:    Estimated LOS:   Other:     Physician Treatment Plan for Primary Diagnosis: <principal problem not specified> Long Term Goal(s): Improvement in symptoms so as ready for discharge  Short Term Goals: Ability to identify changes in lifestyle to reduce recurrence of condition will improve, Ability to verbalize feelings will improve, Ability to disclose and discuss suicidal ideas, Ability to demonstrate self-control will improve, Ability to identify and develop effective coping behaviors will improve, Ability to maintain clinical measurements within normal limits will improve, Compliance with prescribed medications will improve and Ability to identify triggers associated with substance abuse/mental health issues will improve  Physician Treatment Plan for Secondary Diagnosis: Active Problems:   MDD (major depressive disorder), recurrent severe, without psychosis ([place])  Long Term Goal(s): Improvement in symptoms so as ready for discharge  Short Term Goals: Ability to identify changes in lifestyle to reduce recurrence of condition will improve,  Ability to verbalize feelings will improve, Ability to disclose and discuss suicidal ideas, Ability to demonstrate self-control will improve, Ability to identify and develop effective coping behaviors will improve, Ability to maintain clinical measurements within normal limits will improve, Compliance with prescribed medications will improve and Ability to identify triggers associated with substance abuse/mental health issues will improve  I certify that inpatient services furnished can reasonably be expected to improve the patient's condition.    [place], MD 2/14/20193:50 PM

## 2017-11-24 NOTE — Progress Notes (Signed)
CSW received a phone call from the [facility]'s [facility] Care Coordinator, Remigio EisenmengerKaren Rudd [phone]). Clydie BraunKaren requested an update on the patient's tentative discharge plans. CSW informed Clydie BraunKaren that the patient was a new admit, and that CSW would call her with an update after completing the PSA with the patient.   Clydie BraunKaren voiced understanding and stated she would call CSW if she came across any information that would be beneficial to the patient's treatment and/or discharge planning.   CSW will continue to follow.   Baldo DaubJolan Maylene Crocker, MSW, LCSWA Clinical Social Worker [facility]  Phone: [phone]

## 2017-11-24 NOTE — Progress Notes (Signed)
Pt attend wrap up group. Her goal to get her medication regular.
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
## 2017-11-25 DIAGNOSIS — F603 Borderline personality disorder: Secondary | ICD-10-CM

## 2017-11-25 MED ORDER — THIAMINE HCL 100 MG/ML IJ SOLN
100.0000 mg | Freq: Once | INTRAMUSCULAR | Status: AC
  Administered 2017-11-25: 100 mg via INTRAMUSCULAR
  Filled 2017-11-25: qty 2

## 2017-11-25 MED ORDER — TRAZODONE HCL 150 MG PO TABS
300.0000 mg | ORAL_TABLET | Freq: Every day | ORAL | Status: DC
  Administered 2017-11-25 – 2017-11-26 (×2): 300 mg via ORAL
  Filled 2017-11-25 (×5): qty 2

## 2017-11-25 MED ORDER — ONDANSETRON 4 MG PO TBDP: 4.0000 mg | ORAL_TABLET | Freq: Four times a day (QID) | ORAL | Status: DC | PRN

## 2017-11-25 MED ORDER — LOPERAMIDE HCL 2 MG PO CAPS: 2.0000 mg | ORAL_CAPSULE | ORAL | Status: DC | PRN

## 2017-11-25 MED ORDER — VITAMIN B-1 100 MG PO TABS
100.0000 mg | ORAL_TABLET | Freq: Every day | ORAL | Status: DC
  Administered 2017-11-26 – 2017-11-27 (×2): 100 mg via ORAL
  Filled 2017-11-25 (×4): qty 1

## 2017-11-25 MED ORDER — ADULT MULTIVITAMIN W/MINERALS CH
1.0000 | ORAL_TABLET | Freq: Every day | ORAL | Status: DC
  Administered 2017-11-25 – 2017-11-27 (×3): 1 via ORAL
  Filled 2017-11-25 (×5): qty 1

## 2017-11-25 MED ORDER — CHLORDIAZEPOXIDE HCL 25 MG PO CAPS
25.0000 mg | ORAL_CAPSULE | Freq: Four times a day (QID) | ORAL | Status: DC | PRN
  Administered 2017-11-25 – 2017-11-27 (×5): 25 mg via ORAL
  Filled 2017-11-25 (×5): qty 1

## 2017-11-25 NOTE — Progress Notes (Signed)
Psychoeducational Group Note  Date:  11/25/2017 Time:  1515  Group Topic/Focus:  Recovery Goals:   The focus of this group is to identify appropriate goals for recovery and establish a plan to achieve them.  Participation Level: Did Not Attend  Participation Quality:  Not Applicable  Affect:  Not Applicable  Cognitive:  Not Applicable  Insight:  Not Applicable  Engagement in Group: Not Applicable  Additional Comments:  Pt could not attend group this morning because she was in a meeting with the MD.  Evlyn ClinesRINITY, Jameika Kinn E 11/25/2017, 3:31 PM

## 2017-11-25 NOTE — Progress Notes (Signed)
Patient ID: Brittany [name], female   DOB: [birth year], 32 y.o.   MRN: [id number]  Pt currently presents with a flat affect and anxious behavior. Pt reports to writer that their goal is to "focus on coping skills and learn more in groups." Presents with signs and symptoms of withdrawal including agitation, fatigue and anxiety. Pt asks for librium, reports decreased malaise after taking medication. Pt reports good sleep with current medication regimen.   Pt provided with medications per providers orders. Pt's labs and vitals were monitored throughout the night. Pt given a 1:1 about emotional and mental status. Pt supported and encouraged to express concerns and questions. Pt educated on medications.  Pt's safety ensured with 15 minute and environmental checks. Pt currently denies SI/HI and A/V hallucinations. Pt verbally agrees to seek staff if SI/HI or A/VH occurs and to consult with staff before acting on any harmful thoughts. Will continue POC.

## 2017-11-25 NOTE — [facility] Group Notes (Signed)
Orientation / Goals   Date:  11/25/2017  Time:  9:59 AM  Type of Therapy:  Nurse Education  /  The group focuses on teaching patients who their staff is, what the staff responsibiliities are and what the unit programming / scheduling looks like.  Participation Level:  Active  Participation Quality:  Attentive  Affect:  Appropriate  Cognitive:  Appropriate  Insight:  Appropriate   Engagement in Group:  Engaged  Modes of Intervention:  Education  Summary of Progress/Problems:  Brittany [name], Brittany [name] Lynn 11/25/2017, 9:59 AM

## 2017-11-25 NOTE — Tx Team (Signed)
Interdisciplinary Treatment and Diagnostic Plan Update  11/25/2017 Time of Session: 10:00a [name] MRN: [id number]  Principal Diagnosis: Borderline personality disorder [facility])  Secondary Diagnoses: Principal Problem:   Borderline personality disorder (Brittany [name]) Active Problems:   MDD (major depressive disorder), recurrent severe, without psychosis (Brittany [name])   Current Medications:  Current Facility-Administered Medications  Medication Dose Route Frequency Provider Last Rate Last Dose  . acetaminophen (TYLENOL) tablet 650 mg  650 mg Oral Q6H PRN Rankin, Shuvon B, NP      . alum & mag hydroxide-simeth (MAALOX/MYLANTA) 200-200-20 MG/5ML suspension 30 mL  30 mL Oral Q4H PRN Rankin, Shuvon B, NP      . gabapentin (NEURONTIN) capsule 400 mg  400 mg Oral TID Rankin, Shuvon B, NP   400 mg at 11/25/17 0832  . hydrOXYzine (ATARAX/VISTARIL) tablet 50 mg  50 mg Oral TID PRN Brittany Beach, MD   50 mg at 11/25/17 5701  . magnesium hydroxide (MILK OF MAGNESIA) suspension 30 mL  30 mL Oral Daily PRN Rankin, Shuvon B, NP      . traZODone (DESYREL) tablet 300 mg  300 mg Oral QHS PRN,MR X 1 Brittany Clan E, PA-C   300 mg at 11/24/17 2132  . ziprasidone (GEODON) capsule 40 mg  40 mg Oral BID WC Rankin, Shuvon B, NP   40 mg at 11/25/17 7793   PTA Medications: Medications Prior to Admission  Medication Sig Dispense Refill Last Dose  . alprazolam (XANAX) 2 MG tablet Take 2 mg by mouth 3 (three) times daily as needed for sleep.   11/23/2017 at Unknown time  . traZODone (DESYREL) 300 MG tablet Take 1 tablet (300 mg total) by mouth at bedtime. 30 tablet 0 11/22/2017 at Unknown time  . ziprasidone (GEODON) 20 MG capsule Take 1 capsule (20 mg total) by mouth 2 (two) times daily with a meal. For mood control 60 capsule 0 11/23/2017 at Unknown time  . gabapentin (NEURONTIN) 400 MG capsule Take 1 capsule (400 mg total) by mouth 3 (three) times daily. For agitation (Patient not taking: Reported on 11/23/2017) 90  capsule 0 Not Taking at Unknown time  . hydrOXYzine (ATARAX/VISTARIL) 25 MG tablet Take 1 tablet (25 mg total) by mouth 3 (three) times daily as needed for anxiety. (Patient not taking: Reported on 11/23/2017) 30 tablet 0 Not Taking at Unknown time    Patient Stressors: Financial difficulties Medication change or noncompliance  Patient Strengths: [employer]ator fund of knowledge Physical Health  Treatment Modalities: Medication Management, Group therapy, Case management,  1 to 1 session with clinician, Psychoeducation, Recreational therapy.   Physician Treatment Plan for Primary Diagnosis: Borderline personality disorder ([place]) Long Term Goal(s): Improvement in symptoms so as ready for discharge Improvement in symptoms so as ready for discharge   Short Term Goals: Ability to identify changes in lifestyle to reduce recurrence of condition will improve Ability to verbalize feelings will improve Ability to disclose and discuss suicidal ideas Ability to demonstrate self-control will improve Ability to identify and develop effective coping behaviors will improve Ability to maintain clinical measurements within normal limits will improve Compliance with prescribed medications will improve Ability to identify triggers associated with substance abuse/mental health issues will improve Ability to identify changes in lifestyle to reduce recurrence of condition will improve Ability to verbalize feelings will improve Ability to disclose and discuss suicidal ideas Ability to demonstrate self-control will improve Ability to identify and develop effective coping behaviors will improve Ability to maintain clinical measurements within normal  limits will improve Compliance with prescribed medications will improve Ability to identify triggers associated with substance abuse/mental health issues will improve  Medication Management: Evaluate patient's response, side effects, and tolerance  of medication regimen.  Therapeutic Interventions: 1 to 1 sessions, Unit Group sessions and Medication administration.  Evaluation of Outcomes: Not Met  Physician Treatment Plan for Secondary Diagnosis: Principal Problem:   Borderline personality disorder (Brittany [name]) Active Problems:   MDD (major depressive disorder), recurrent severe, without psychosis ([place])  Long Term Goal(s): Improvement in symptoms so as ready for discharge Improvement in symptoms so as ready for discharge   Short Term Goals: Ability to identify changes in lifestyle to reduce recurrence of condition will improve Ability to verbalize feelings will improve Ability to disclose and discuss suicidal ideas Ability to demonstrate self-control will improve Ability to identify and develop effective coping behaviors will improve Ability to maintain clinical measurements within normal limits will improve Compliance with prescribed medications will improve Ability to identify triggers associated with substance abuse/mental health issues will improve Ability to identify changes in lifestyle to reduce recurrence of condition will improve Ability to verbalize feelings will improve Ability to disclose and discuss suicidal ideas Ability to demonstrate self-control will improve Ability to identify and develop effective coping behaviors will improve Ability to maintain clinical measurements within normal limits will improve Compliance with prescribed medications will improve Ability to identify triggers associated with substance abuse/mental health issues will improve     Medication Management: Evaluate patient's response, side effects, and tolerance of medication regimen.  Therapeutic Interventions: 1 to 1 sessions, Unit Group sessions and Medication administration.  Evaluation of Outcomes: Not Met   RN Treatment Plan for Primary Diagnosis: Borderline personality disorder (Brittany [name]) Long Term Goal(s): Knowledge of disease and  therapeutic regimen to maintain health will improve  Short Term Goals: Ability to remain free from injury will improve, Ability to verbalize frustration and anger appropriately will improve, Ability to demonstrate self-control, Ability to participate in decision making will improve, Ability to verbalize feelings will improve, Ability to disclose and discuss suicidal ideas, Ability to identify and develop effective coping behaviors will improve and Compliance with prescribed medications will improve  Medication Management: RN will administer medications as ordered by provider, will assess and evaluate patient's response and provide education to patient for prescribed medication. RN will report any adverse and/or side effects to prescribing provider.  Therapeutic Interventions: 1 on 1 counseling sessions, Psychoeducation, Medication administration, Evaluate responses to treatment, Monitor vital signs and CBGs as ordered, Perform/monitor CIWA, COWS, AIMS and Fall Risk screenings as ordered, Perform wound care treatments as ordered.  Evaluation of Outcomes: Not Met   [name] Treatment Plan for Primary Diagnosis: Borderline personality disorder ([place]) Long Term Goal(s): Safe transition to appropriate next level of care at discharge, Engage patient in therapeutic group addressing interpersonal concerns.  Short Term Goals: Engage patient in aftercare planning with referrals and resources, Increase social support, Increase ability to appropriately verbalize feelings, Increase emotional regulation, Facilitate acceptance of mental health diagnosis and concerns, Facilitate patient progression through stages of change regarding substance use diagnoses and concerns, Identify triggers associated with mental health/substance abuse issues and Increase skills for wellness and recovery  Therapeutic Interventions: Assess for all discharge needs, 1 to 1 time with Social worker, Explore available resources and support  systems, Assess for adequacy in community support network, Educate family and significant other(s) on suicide prevention, Complete Psychosocial Assessment, Interpersonal group therapy.  Evaluation of Outcomes: Not Met   Progress in Treatment:  Attending groups: Yes. Participating in groups: Yes. Taking medication as prescribed: Yes. Toleration medication: Yes. Family/Significant other contact made: No, will contact:  patient refused consent; CSW will continue to assess for appropiate contact, if patient provides consent  Patient understands diagnosis: Yes. Discussing patient identified problems/goals with staff: Yes. Medical problems stabilized or resolved: Yes. Denies suicidal/homicidal ideation: Yes. Issues/concerns per patient self-inventory: Yes. Other:   New problem(s) identified: NONE  New Short Term/Long Term Goal(s): medication stabilization, elimination of SI thoughts, development of comprehensive mental wellness plan.   Patient Goal: "To get my medications stabilize and to be safe"   Discharge Plan or Barriers: Return home and follow up with her PCP at [facility] for medication management. CSW will continue to assess for possible therapy referrals.   Reason for Continuation of Hospitalization: Anxiety Depression Medication stabilization  Estimated Length of Stay: 11/29/17  Attendees: Patient: Brittany [name] 11/25/2017 8:49 AM  Physician: Dr. Leanord Hawking 11/25/2017 8:49 AM  Nursing: Rise Paganini, RN 11/25/2017 8:49 AM  RN Care Manager: 11/25/2017 8:49 AM  Social Worker: Radonna Ricker, [place] 11/25/2017 8:49 AM  Recreational Therapist:  11/25/2017 8:49 AM  Other:  11/25/2017 8:49 AM  Other:  11/25/2017 8:49 AM  Other: 11/25/2017 8:49 AM    Scribe for Treatment Team: Marylee Floras, [place] 11/25/2017 8:49 AM

## 2017-11-25 NOTE — Progress Notes (Signed)
Adult Psychoeducational Group Note  Date:  11/25/2017 Time:  9:05 PM  Group Topic/Focus:  Wrap-Up Group:   The focus of this group is to help patients review their daily goal of treatment and discuss progress on daily workbooks.  Participation Level:  Active  Participation Quality:  Appropriate  Affect:  Appropriate  Cognitive:  Appropriate  Insight: Appropriate  Engagement in Group:  Engaged  Modes of Intervention:  Discussion  Additional Comments:  Patient attended group and participated.  Niaya Hickok W Caroljean Monsivais 11/25/2017, 9:05 PM

## 2017-11-25 NOTE — Progress Notes (Signed)
Recreation Therapy Notes  Date: 11/25/17 Time: 0930 Location: [address] Dayroom  Group Topic: Stress Management  Goal Area(s) Addresses:  Patient will verbalize importance of using healthy stress management.  Patient will identify positive emotions associated with healthy stress management.   Behavioral Response: Engaged  Intervention: Stress Management  Activity :  Body Scan Meditation.  LRT introduced the stress management technique of meditation.  LRT played a meditation from the Calm app that allowed patients to take inventory of any sensations or tension they may have been experiencing.  Education:  Stress Management, Discharge Planning.   Education Outcome: Acknowledges edcuation/In group clarification offered/Needs additional education  Clinical Observations/Feedback: Pt attended group.    Kortnie Stovall, LRT/CTRS         Tricia [place] A 11/25/2017 12:49 PM 

## 2017-11-25 NOTE — Plan of Care (Signed)
Nurse discussed anxiety, depression, coping skills with patient. 

## 2017-11-25 NOTE — Progress Notes (Signed)
[facility] MD Progress Note  11/25/2017 2:00 PM SCOUT GUYETT  MRN:  [id number]   Subjective:  Patient reports that she had a panic attack earlier and had to have some medications since she hasn't had any Xanax since Wednesday. She states she then went and slept and I feel fine now. She denies any SI/HI/AVH and contracts for safety.   Objective: Patient's chart and findings reviewed and discussed with treatment team. Patient presents in the hallway going to group. She is pleasant and cooperative. She was seen earlier this morning and she did not appear anxious and has been seeking medications during her stay thus far. She confirmed that she only came in to get her medication adjusted and to get a break away from her family. She will continue current medications. Due to Benzo positive UDS and her reporting she takes Xanax regularly will start Librium PRN CIWA Detox protocol.  Principal Problem: Borderline personality disorder ([place]) Diagnosis:   Patient Active Problem List   Diagnosis Date Noted  . MDD (major depressive disorder), recurrent severe, without psychosis ([place]) [F33.2] 11/23/2017  . Bipolar 1 disorder, depressed, severe ([place]) [F31.4] 10/23/2017  . Bipolar 1 disorder ([place]) [F31.9] 07/18/2017  . Bipolar disorder, curr episode mixed, severe, with psychotic features ([place]) [F31.64] 06/08/2017  . ADHD (attention deficit hyperactivity disorder) [F90.9] 09/15/2016  . Borderline personality disorder ([place]) [F60.3] 05/04/2016  . Overdose [T50.901A] 05/03/2016  . Noncompliance [Z91.19] 05/03/2016   Total Time spent with patient: 25 minutes  Past Psychiatric History: See H&P  Past Medical History:  Past Medical History:  Diagnosis Date  . ADHD (attention deficit hyperactivity disorder)   . Anxiety   . Arthritis    right shoulder, bilateral knees  . Bipolar disorder ([place])   . Bipolar I disorder, most recent episode depressed ([place])   . Chronic kidney disease    Kidney stones  . Depression    . Headache     Past Surgical History:  Procedure Laterality Date  . CESAREAN SECTION    . CHOLECYSTECTOMY  2017  . FOOT SURGERY    . KIDNEY SURGERY    . [place]  2010  . SHOULDER SURGERY Right   . TUBAL LIGATION     Family History:  Family History  Problem Relation Age of Onset  . Anxiety disorder Mother   . Depression Mother   . Drug abuse Mother   . Bipolar disorder Mother   . Depression Father   . Anxiety disorder Father   . Drug abuse Father   . Anxiety disorder Sister   . Depression Sister   . ADD / ADHD Sister   . Anxiety disorder Sister   . Depression Sister    Family Psychiatric  History: See H&P Social History:  Social History   Substance and Sexual Activity  Alcohol Use Yes  . Alcohol/week: 0.0 oz   Comment: pt repeorts maybe drinks once a month     Social History   Substance and Sexual Activity  Drug Use Yes  . Types: Marijuana   Comment: pt reports THC oil, vapes daily    Social History   Socioeconomic History  . Marital status: Married    Spouse name: None  . Number of children: None  . Years of education: None  . Highest education level: None  Social Needs  . Financial resource strain: None  . Food insecurity - worry: None  . Food insecurity - inability: None  . Transportation needs - medical: None  .  Transportation needs - non-medical: None  Occupational History  . None  Tobacco Use  . Smoking status: Former Smoker    Types: E-cigarettes  . Smokeless tobacco: Never Used  Substance and Sexual Activity  . Alcohol use: Yes    Alcohol/week: 0.0 oz    Comment: pt repeorts maybe drinks once a month  . Drug use: Yes    Types: Marijuana    Comment: pt reports THC oil, vapes daily  . Sexual activity: Yes    Birth control/protection: None  Other Topics Concern  . None  Social History Narrative  . None   Additional Social History:    Pain Medications: See MAR Prescriptions: See MAR Over the Counter: See MAR Longest  period of sobriety (when/how long): One year Name of Substance 1: Marijuana 1 - Age of First Use: 16 1 - Amount (size/oz): One joint 1 - Frequency: Daily 1 - Duration: Ongoing 1 - Last Use / Amount: 11/23/17                  Sleep: Good  Appetite:  Good  Current Medications: Current Facility-Administered Medications  Medication Dose Route Frequency Provider Last Rate Last Dose  . acetaminophen (TYLENOL) tablet 650 mg  650 mg Oral Q6H PRN Rankin, Shuvon B, NP      . alum & mag hydroxide-simeth (MAALOX/MYLANTA) 200-200-20 MG/5ML suspension 30 mL  30 mL Oral Q4H PRN Rankin, Shuvon B, NP      . chlordiazePOXIDE (LIBRIUM) capsule 25 mg  25 mg Oral Q6H PRN Vaniyah Lansky, [place] Ram, FNP   25 mg at 11/25/17 1141  . gabapentin (NEURONTIN) capsule 400 mg  400 mg Oral TID Rankin, Shuvon B, NP   400 mg at 11/25/17 1140  . hydrOXYzine (ATARAX/VISTARIL) tablet 50 mg  50 mg Oral TID PRN Artist Beach, MD   50 mg at 11/25/17 2620  . loperamide (IMODIUM) capsule 2-4 mg  2-4 mg Oral PRN Yuri Flener, [place] Ram, FNP      . magnesium hydroxide (MILK OF MAGNESIA) suspension 30 mL  30 mL Oral Daily PRN Rankin, Shuvon B, NP      . multivitamin with minerals tablet 1 tablet  1 tablet Oral Daily Dafney Farler, [place] Ram, FNP   1 tablet at 11/25/17 1140  . ondansetron (ZOFRAN-ODT) disintegrating tablet 4 mg  4 mg Oral Q6H PRN Kasir Hallenbeck, [place] Ram, FNP      . [START ON 11/26/2017] thiamine (VITAMIN B-1) tablet 100 mg  100 mg Oral Daily Tylesha Gibeault, Darnelle Maffucci B, FNP      . traZODone (DESYREL) tablet 300 mg  300 mg Oral QHS PRN,MR X 1 Laverle Hobby, PA-C   300 mg at 11/24/17 2132  . ziprasidone (GEODON) capsule 40 mg  40 mg Oral BID WC Rankin, Shuvon B, NP   40 mg at 11/25/17 3559    Lab Results:  Results for orders placed or performed during the hospital encounter of 11/23/17 (from the past 48 hour(s))  Urinalysis, Complete w Microscopic     Status: Abnormal   Collection Time: 11/23/17  4:15 PM  Result Value Ref Range   Color, Urine  YELLOW YELLOW   APPearance TURBID (A) CLEAR   Specific Gravity, Urine 1.018 1.005 - 1.030   pH 5.0 5.0 - 8.0   Glucose, UA NEGATIVE NEGATIVE mg/dL   Hgb urine dipstick SMALL (A) NEGATIVE   Bilirubin Urine NEGATIVE NEGATIVE   Ketones, ur NEGATIVE NEGATIVE mg/dL   Protein, ur NEGATIVE NEGATIVE mg/dL   Nitrite  NEGATIVE NEGATIVE   Leukocytes, UA NEGATIVE NEGATIVE   RBC / HPF NONE SEEN 0 - 5 RBC/hpf   WBC, UA NONE SEEN 0 - 5 WBC/hpf   Bacteria, UA NONE SEEN NONE SEEN   Squamous Epithelial / LPF 6-30 (A) NONE SEEN   Mucus PRESENT     Comment: Performed at [facility], [place] [address], [place], [place] [address]  Urine rapid drug screen (hosp performed)not at [facility]     Status: Abnormal   Collection Time: 11/23/17  4:15 PM  Result Value Ref Range   Opiates NONE DETECTED NONE DETECTED   Cocaine NONE DETECTED NONE DETECTED   Benzodiazepines POSITIVE (A) NONE DETECTED   Amphetamines NONE DETECTED NONE DETECTED   Tetrahydrocannabinol POSITIVE (A) NONE DETECTED   Barbiturates NONE DETECTED NONE DETECTED    Comment: (NOTE) DRUG SCREEN FOR MEDICAL PURPOSES ONLY.  IF CONFIRMATION IS NEEDED FOR ANY PURPOSE, NOTIFY LAB WITHIN 5 DAYS. LOWEST DETECTABLE LIMITS FOR URINE DRUG SCREEN Drug Class                     Cutoff (ng/mL) Amphetamine and metabolites    1000 Barbiturate and metabolites    200 Benzodiazepine                 450 Tricyclics and metabolites     300 Opiates and metabolites        300 Cocaine and metabolites        300 THC                            50 Performed at [facility], [place] [address] [place], [place] [address]   Pregnancy, urine     Status: None   Collection Time: 11/23/17  4:15 PM  Result Value Ref Range   Preg Test, Ur NEGATIVE NEGATIVE    Comment:        THE SENSITIVITY OF THIS METHODOLOGY IS >20 mIU/mL. Performed at [facility], [place] [address] [place], [place] [address]   CBC     Status: None    Collection Time: 11/24/17  7:21 AM  Result Value Ref Range   WBC 8.4 4.0 - 10.5 K/uL   RBC 4.35 3.87 - 5.11 MIL/uL   Hemoglobin 13.6 12.0 - 15.0 g/dL   HCT 40.1 36.0 - 46.0 %   MCV 92.2 78.0 - 100.0 fL   MCH 31.3 26.0 - 34.0 pg   MCHC 33.9 30.0 - 36.0 g/dL   RDW 13.2 11.5 - 15.5 %   Platelets 179 150 - 400 K/uL    Comment: Performed at [facility], [place] [address] [place],  [address]  Comprehensive metabolic panel     Status: Abnormal   Collection Time: 11/24/17  7:21 AM  Result Value Ref Range   Sodium 137 135 - 145 mmol/L   Potassium 3.5 3.5 - 5.1 mmol/L   Chloride 106 101 - 111 mmol/L   CO2 21 (L) 22 - 32 mmol/L   Glucose, Bld 155 (H) 65 - 99 mg/dL   BUN 15 6 - 20 mg/dL   Creatinine, Ser 0.94 0.44 - 1.00 mg/dL   Calcium 8.9 8.9 - 10.3 mg/dL   Total Protein 7.1 6.5 - 8.1 g/dL   Albumin 3.9 3.5 - 5.0 g/dL   AST 22 15 - 41 U/L   ALT 15 14 - 54 U/L   Alkaline Phosphatase  65 38 - 126 U/L   Total Bilirubin 0.3 0.3 - 1.2 mg/dL   GFR calc non Af Amer >60 >60 mL/min   GFR calc Af Amer >60 >60 mL/min    Comment: (NOTE) The eGFR has been calculated using the CKD EPI equation. This calculation has not been validated in all clinical situations. eGFR's persistently <60 mL/min signify possible Chronic Kidney Disease.    Anion gap 10 5 - 15    Comment: Performed at [facility], [place] [address] [place], [place] [address]  Hemoglobin A1c     Status: None   Collection Time: 11/24/17  7:21 AM  Result Value Ref Range   Hgb A1c MFr Bld 4.9 4.8 - 5.6 %    Comment: (NOTE) Pre diabetes:          5.7%-6.4% Diabetes:              >6.4% Glycemic control for   <7.0% adults with diabetes    Mean Plasma Glucose 93.93 mg/dL    Comment: Performed at [place] [address], [place], [place] [address]  Ethanol     Status: None   Collection Time: 11/24/17  7:21 AM  Result Value Ref Range   Alcohol, Ethyl (B) <10 <10 mg/dL    Comment:         LOWEST DETECTABLE LIMIT FOR SERUM ALCOHOL IS 10 mg/dL FOR MEDICAL PURPOSES ONLY Performed at [facility], [place] [address] [place], [place] [address]   Lipid panel     Status: Abnormal   Collection Time: 11/24/17  7:21 AM  Result Value Ref Range   Cholesterol 238 (H) 0 - 200 mg/dL   Triglycerides 210 (H) <150 mg/dL   HDL 55 >40 mg/dL   Total CHOL/HDL Ratio 4.3 RATIO   VLDL 42 (H) 0 - 40 mg/dL   LDL Cholesterol 141 (H) 0 - 99 mg/dL    Comment:        Total Cholesterol/HDL:CHD Risk Coronary Heart Disease Risk Table                     Men   Women  1/2 Average Risk   3.4   3.3  Average Risk       5.0   4.4  2 X Average Risk   9.6   7.1  3 X Average Risk  23.4   11.0        Use the calculated Patient Ratio above and the CHD Risk Table to determine the patient's CHD Risk.        ATP III CLASSIFICATION (LDL):  <100     mg/dL   Optimal  100-129  mg/dL   Near or Above                    Optimal  130-159  mg/dL   Borderline  160-189  mg/dL   High  >190     mg/dL   Very High Performed at [place] [address], [place], [place] [address]   TSH     Status: None   Collection Time: 11/24/17  7:21 AM  Result Value Ref Range   TSH 1.987 0.350 - 4.500 uIU/mL    Comment: Performed by a 3rd Generation assay with a functional sensitivity of <=0.01 uIU/mL. Performed at [facility], [place] [address] [place], [place] [address]     Blood Alcohol level:  Lab Results  Component Value Date   ETH <10 11/24/2017   ETH <5 69/62/9528    Metabolic Disorder Labs: Lab Results  Component Value Date   HGBA1C 4.9 11/24/2017   MPG 93.93 11/24/2017   MPG 88.19 10/24/2017   Lab Results  Component Value Date   PROLACTIN 25.3 (H) 07/21/2017   PROLACTIN 57.7 (H) 06/10/2017   Lab Results  Component Value Date   CHOL 238 (H) 11/24/2017   TRIG 210 (H) 11/24/2017   HDL 55 11/24/2017   CHOLHDL 4.3 11/24/2017   VLDL 42 (H)  11/24/2017   LDLCALC 141 (H) 11/24/2017   LDLCALC 203 (H) 10/24/2017    Physical Findings: AIMS: Facial and Oral Movements Muscles of Facial Expression: None, normal Lips and Perioral Area: None, normal Jaw: None, normal Tongue: None, normal,Extremity Movements Upper (arms, wrists, hands, fingers): None, normal Lower (legs, knees, ankles, toes): None, normal, Trunk Movements Neck, shoulders, hips: None, normal, Overall Severity Severity of abnormal movements (highest score from questions above): None, normal Incapacitation due to abnormal movements: None, normal Patient's awareness of abnormal movements (rate only patient's report): No Awareness, Dental Status Current problems with teeth and/or dentures?: No Does patient usually wear dentures?: No  CIWA:    COWS:     Musculoskeletal: Strength & Muscle Tone: within normal limits Gait & Station: normal Patient leans: N/A  Psychiatric Specialty Exam: Physical Exam  Nursing note and vitals reviewed. Constitutional: She is oriented to person, place, and time. She appears well-developed and well-nourished.  Cardiovascular: Normal rate.  Respiratory: Effort normal.  Musculoskeletal: Normal range of motion.  Neurological: She is alert and oriented to person, place, and time.  Skin: Skin is warm.    Review of Systems  Constitutional: Negative.   HENT: Negative.   Eyes: Negative.   Respiratory: Negative.   Cardiovascular: Negative.   Gastrointestinal: Negative.   Genitourinary: Negative.   Musculoskeletal: Negative.   Skin: Negative.   Neurological: Negative.   Endo/Heme/Allergies: Negative.   Psychiatric/Behavioral: The patient is nervous/anxious.     Blood pressure (!) 127/98, pulse 93, temperature 98.1 F (36.7 C), temperature source Oral, resp. rate 16, height '5\' 5"'  (1.651 m), weight 117 kg (258 lb), SpO2 98 %.Body mass index is 42.93 kg/m.  General Appearance: Casual  Eye Contact:  Good  Speech:  Clear and Coherent  and Normal Rate  Volume:  Normal  Mood:  Euthymic  Affect:  Congruent  Thought Process:  Goal Directed and Descriptions of Associations: Intact  Orientation:  Full (Time, Place, and Person)  Thought Content:  WDL  Suicidal Thoughts:  No  Homicidal Thoughts:  No  Memory:  Immediate;   Good Recent;   Good Remote;   Good  Judgement:  Good  Insight:  Good  Psychomotor Activity:  Normal  Concentration:  Concentration: Good and Attention Span: Good  Recall:  Good  Fund of Knowledge:  Good  Language:  Good  Akathisia:  No  Handed:  Right  AIMS (if indicated):     Assets:  Communication Skills Desire for Improvement Financial Resources/Insurance Housing Physical Health Social Support Transportation  ADL's:  Intact  Cognition:  WNL  Sleep:  Number of Hours: 6.75   Problems Addressed: MDD severe Borderline Personality  Treatment Plan Summary: Daily contact with patient to assess and evaluate symptoms and progress in treatment, Medication management and Plan is to:  -Continue Geodon 40 mg PO BID WC for mood stability -Continue Trazodone 300 mg PO QHS for sleep and mood stability -Continue Vistaril 50  mg PO TID PRN for anxiety -Continue Gabapentin 400 mg PO TID for anxiety -Start Librium PRN CIWA Detox Protocol -Encourage group therapy participation  Lewis Shock, FNP 11/25/2017, 2:00 PM

## 2017-11-25 NOTE — [facility] Group Notes (Signed)
[name] Group Therapy Note 11/25/2017 12:54 PM  Type of Therapy and Topic: Group Therapy: Avoiding Self-Sabotaging and Enabling Behaviors  Participation Level: Active  Description of Group:  In this group, patients will learn how to identify obstacles, self-sabotaging and enabling behaviors, as well as: what are they, why do we do them and what needs these behaviors meet. Discuss unhealthy relationships and how to have positive healthy boundaries with those that sabotage and enable. Explore aspects of self-sabotage and enabling in yourself and how to limit these self-destructive behaviors in everyday life.  Therapeutic Goals: 1. Patient will identify one obstacle that relates to self-sabotage and enabling behaviors 2. Patient will identify one personal self-sabotaging or enabling behavior they did prior to admission 3. Patient will state a plan to change the above identified behavior 4. Patient will demonstrate ability to communicate their needs through discussion and/or role play.   Summary of Patient Progress:  Brittany [name] was engaged throughout the group session. She participated and contributed to the group's discussion. She also was able to identify some of her self -sabotaging behaviors and has agreed to make a plan of change for the future.   Therapeutic Modalities:  Cognitive Behavioral Therapy Person-Centered Therapy Motivational Interviewing   Baldo DaubJolan Micael Barb LCSWA Clinical Social Worker

## 2017-11-25 NOTE — Progress Notes (Signed)
D:  Patient's self inventory sheet, patient has poor sleep, sleep medication not helpful.  Good appetite, normal energy level, good concentration.  Rated depression 4, hopeless 3, anxiety 8.   Denied withdrawals.  Denied SI.  Denied physical problems.  Denied physical pain.  No pain medication.  Goal is coping mechanisms.  Plans to partake.  Anxiety is almost debilitating today.   A:  Medications administered per MD orders.  Emotional support and encouragement given patient. R:  Denied SI and HI, contracts for safety.  Denied A/V hallucinations.  Safety maintained with 15 minute checks.

## 2017-11-25 NOTE — Progress Notes (Signed)
Pt is in a more positive mood this evening than when she was admitted yesterday.  She says that she spoke with the doctor about her meds and changes were made.  She also was able to get some of the make-up she wanted out of her locker today which made her feel better.  She tells Clinical research associatewriter that she has been able to give and receive support with the other patients which has made her feel good.  She tells Clinical research associatewriter she is grateful for writer's help yesterday and today.  Pt denies SI/HI/AVH.  She voiced no needs or concerns this evening.  Support and encouragement offered.  Discharge plans are in process.  Pt will return home at discharge.  Safety maintained with q15 minute checks.

## 2017-11-26 DIAGNOSIS — F1290 Cannabis use, unspecified, uncomplicated: Secondary | ICD-10-CM

## 2017-11-26 DIAGNOSIS — Z818 Family history of other mental and behavioral disorders: Secondary | ICD-10-CM

## 2017-11-26 DIAGNOSIS — Z87891 Personal history of nicotine dependence: Secondary | ICD-10-CM

## 2017-11-26 DIAGNOSIS — Z813 Family history of other psychoactive substance abuse and dependence: Secondary | ICD-10-CM

## 2017-11-26 DIAGNOSIS — F332 Major depressive disorder, recurrent severe without psychotic features: Principal | ICD-10-CM

## 2017-11-26 NOTE — Progress Notes (Signed)
Adult Psychoeducational Group Note  Date:  11/26/2017 Time:  10:12 PM  Group Topic/Focus:  Wrap-Up Group:   The focus of this group is to help patients review their daily goal of treatment and discuss progress on daily workbooks.  Participation Level:  Active  Participation Quality:  Appropriate  Affect:  Appropriate  Cognitive:  Appropriate  Insight: Appropriate  Engagement in Group:  Engaged  Modes of Intervention:  Discussion  Additional Comments:  Patient attended group and participated.   Brittany [name] W [name] 11/26/2017, 10:12 PM

## 2017-11-26 NOTE — [facility] Group Notes (Signed)
Orientation / Goals   Date:  11/26/2017  Time:  6:38 PM  Type of Therapy:  Nurse Education  The group focuses on teaching patients who their staff is, what the staff responsibilitiess are and what the uit programming / scheduliong looks like.  Participation Level:  Active  Participation Quality:  Attentive  Affect:  Appropriate  Cognitive:  Alert  Insight:  Appropriate  Engagement in Group:  Engaged  Modes of Intervention:    Summary of Progress/Problems:  Brittany [name], Brittany [name] 11/26/2017, 6:38 PM

## 2017-11-26 NOTE — Progress Notes (Signed)
Patient ID: Brittany FasterChristine H Dumas, female   DOB: 03/01/1986, 32 y.o.   MRN: [id number]  Pt currently presents with an appropriate affect and anxious behavior. Pt reports to writer that she became upset today when she found out she wasn't being discharged like she thought. Pt reports that even though she got upset she feels she handled it in a way "way better than what I normally would have done, I would have pulled a bristle off of the comb or get a razor and cut my thigh." [facility] Pt reports good sleep with current medication regimen.   Pt provided with medications per providers orders. Pt's labs and vitals were monitored throughout the night. Pt given a 1:1 about emotional and mental status. Pt supported and encouraged to express concerns and questions. Pt educated on medications, assertiveness techniques and CBT.  Pt's safety ensured with 15 minute and environmental checks. Pt currently denies SI/HI and A/V hallucinations. Pt verbally agrees to seek staff if SI/HI or A/VH occurs and to consult with staff before acting on any harmful thoughts.  Will continue POC.
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
## 2017-11-26 NOTE — Progress Notes (Signed)
[facility] MD Progress Note  11/26/2017 1:29 PM Brittany [name]  MRN:  [id number] Subjective:    31y.o Caucasian female, married, lives with her family. Background history ofBipolar disorder unspecified, BLPD. Presented to thethe unit in unaccompanied . Says she was referred by her psychiatrist. Reports feeling depressed lately. She was having suicidal thoughts. Says her her psychiatrist increased her medication and advised her to come into the hospital. Routine labsis significant for elevated lipids.. Toxicology is negative, UDS for THC and Benzodiazepines. No alcohol.  Chart reviewed today. Patient discussed at team today.   Staff reports that she has been seeking benzodiazepines. Expresses subjective anxiety. Has not been observed to be anxious objectively. She has participated in some of the unit activities. She has not voiced any suicidal thoughts. No internal stimulation.   Seen today. Says she is feeling good again. Her family has been visiting and they have been very supportive. She hopes to be home tomorrow. No suicidal thoughts. No thoughts of violence. No homicidal thoughts.  Vitals and CIWA scores are within normal limits.    Principal Problem: Borderline personality disorder (HCC) Diagnosis:   Patient Active Problem List   Diagnosis Date Noted  . MDD (major depressive disorder), recurrent severe, without psychosis (HCC) [F33.2] 11/23/2017  . Bipolar 1 disorder, depressed, severe (HCC) [F31.4] 10/23/2017  . Bipolar 1 disorder (HCC) [F31.9] 07/18/2017  . Bipolar disorder, curr episode mixed, severe, with psychotic features (HCC) [F31.64] 06/08/2017  . ADHD (attention deficit hyperactivity disorder) [F90.9] 09/15/2016  . Borderline personality disorder (HCC) [F60.3] 05/04/2016  . Overdose [T50.901A] 05/03/2016  . Noncompliance [Z91.19] 05/03/2016   Total Time spent with patient: 30 minutes  Past Psychiatric History: As in H&P  Past Medical History:  Past Medical History:   Diagnosis Date  . ADHD (attention deficit hyperactivity disorder)   . Anxiety   . Arthritis    right shoulder, bilateral knees  . Bipolar disorder (HCC)   . Bipolar I disorder, most recent episode depressed (HCC)   . Chronic kidney disease    Kidney stones  . Depression   . Headache     Past Surgical History:  Procedure Laterality Date  . CESAREAN SECTION    . CHOLECYSTECTOMY  2017  . FOOT SURGERY    . KIDNEY SURGERY    . NOVASURE ABLATION  2010  . SHOULDER SURGERY Right   . TUBAL LIGATION     Family History:  Family History  Problem Relation Age of Onset  . Anxiety disorder Mother   . Depression Mother   . Drug abuse Mother   . Bipolar disorder Mother   . Depression Father   . Anxiety disorder Father   . Drug abuse Father   . Anxiety disorder Sister   . Depression Sister   . ADD / ADHD Sister   . Anxiety disorder Sister   . Depression Sister    Family Psychiatric  History: As in H&P Social History:  Social History   Substance and Sexual Activity  Alcohol Use Yes  . Alcohol/week: 0.0 oz   Comment: pt repeorts maybe drinks once a month     Social History   Substance and Sexual Activity  Drug Use Yes  . Types: Marijuana   Comment: pt reports THC oil, vapes daily    Social History   Socioeconomic History  . Marital status: Married    Spouse name: None  . Number of children: None  . Years of education: None  . Highest education level: None  Social Needs  . Financial resource strain: None  . Food insecurity - worry: None  . Food insecurity - inability: None  . Transportation needs - medical: None  . Transportation needs - non-medical: None  Occupational History  . None  Tobacco Use  . Smoking status: Former Smoker    Types: E-cigarettes  . Smokeless tobacco: Never Used  Substance and Sexual Activity  . Alcohol use: Yes    Alcohol/week: 0.0 oz    Comment: pt repeorts maybe drinks once a month  . Drug use: Yes    Types: Marijuana    Comment:  pt reports THC oil, vapes daily  . Sexual activity: Yes    Birth control/protection: None  Other Topics Concern  . None  Social History Narrative  . None   Additional Social History:    Pain Medications: See MAR Prescriptions: See MAR Over the Counter: See MAR Longest period of sobriety (when/how long): One year Name of Substance 1: Marijuana 1 - Age of First Use: 16 1 - Amount (size/oz): One joint 1 - Frequency: Daily 1 - Duration: Ongoing 1 - Last Use / Amount: 11/23/17                  Sleep: Good  Appetite:  Good  Current Medications: Current Facility-Administered Medications  Medication Dose Route Frequency Provider Last Rate Last Dose  . acetaminophen (TYLENOL) tablet 650 mg  650 mg Oral Q6H PRN Rankin, Shuvon B, NP      . alum & mag hydroxide-simeth (MAALOX/MYLANTA) 200-200-20 MG/5ML suspension 30 mL  30 mL Oral Q4H PRN Rankin, Shuvon B, NP      . chlordiazePOXIDE (LIBRIUM) capsule 25 mg  25 mg Oral Q6H PRN Money, Gerlene Burdockravis B, FNP   25 mg at 11/26/17 1206  . gabapentin (NEURONTIN) capsule 400 mg  400 mg Oral TID Rankin, Shuvon B, NP   400 mg at 11/26/17 1206  . hydrOXYzine (ATARAX/VISTARIL) tablet 50 mg  50 mg Oral TID PRN Georgiann CockerIzediuno, Vincent A, MD   50 mg at 11/25/17 [id number]  . loperamide (IMODIUM) capsule 2-4 mg  2-4 mg Oral PRN Money, Gerlene Burdockravis B, FNP      . magnesium hydroxide (MILK OF MAGNESIA) suspension 30 mL  30 mL Oral Daily PRN Rankin, Shuvon B, NP      . multivitamin with minerals tablet 1 tablet  1 tablet Oral Daily Money, Gerlene Burdockravis B, FNP   1 tablet at 11/26/17 0936  . ondansetron (ZOFRAN-ODT) disintegrating tablet 4 mg  4 mg Oral Q6H PRN Money, Gerlene Burdockravis B, FNP      . thiamine (VITAMIN B-1) tablet 100 mg  100 mg Oral Daily Money, Gerlene Burdockravis B, FNP   100 mg at 11/26/17 [id number]  . traZODone (DESYREL) tablet 300 mg  300 mg Oral QHS Money, Gerlene Burdockravis B, FNP   300 mg at 11/25/17 2107  . ziprasidone (GEODON) capsule 40 mg  40 mg Oral BID WC Rankin, Shuvon B, NP   40 mg at 11/26/17  [id number]    Lab Results:  No results found for this or any previous visit (from the past 48 hour(s)).  Blood Alcohol level:  Lab Results  Component Value Date   ETH <10 11/24/2017   ETH <5 05/03/2016    Metabolic Disorder Labs: Lab Results  Component Value Date   HGBA1C 4.9 11/24/2017   MPG 93.93 11/24/2017   MPG 88.19 10/24/2017   Lab Results  Component Value Date   PROLACTIN 25.3 (H) 07/21/2017   PROLACTIN  57.7 (H) 06/10/2017   Lab Results  Component Value Date   CHOL 238 (H) 11/24/2017   TRIG 210 (H) 11/24/2017   HDL 55 11/24/2017   CHOLHDL 4.3 11/24/2017   VLDL 42 (H) 11/24/2017   LDLCALC 141 (H) 11/24/2017   LDLCALC 203 (H) 10/24/2017    Physical Findings: AIMS: Facial and Oral Movements Muscles of Facial Expression: None, normal Lips and Perioral Area: None, normal Jaw: None, normal Tongue: None, normal,Extremity Movements Upper (arms, wrists, hands, fingers): None, normal Lower (legs, knees, ankles, toes): None, normal, Trunk Movements Neck, shoulders, hips: None, normal, Overall Severity Severity of abnormal movements (highest score from questions above): None, normal Incapacitation due to abnormal movements: None, normal Patient's awareness of abnormal movements (rate only patient's report): No Awareness, Dental Status Current problems with teeth and/or dentures?: No Does patient usually wear dentures?: No  CIWA:  CIWA-Ar Total: 1 COWS:  COWS Total Score: 7  Musculoskeletal: Strength & Muscle Tone: within normal limits Gait & Station: normal Patient leans: N/A  Psychiatric Specialty Exam: Physical Exam  Constitutional: She is oriented to person, place, and time. She appears well-developed and well-nourished.  HENT:  Head: Normocephalic and atraumatic.  Respiratory: Effort normal.  Neurological: She is alert and oriented to person, place, and time.  Psychiatric:  As above    ROS   Blood pressure 114/82, pulse 82, temperature 97.9 F (36.6 C),  temperature source Oral, resp. rate 20, height 5\' 5"  (1.651 m), weight 117 kg (258 lb), SpO2 98 %.Body mass index is 42.93 kg/m.  General Appearance: Well groomed, good relatedness. Appropriate.   Eye Contact:  Good  Speech:  Spontaneous. Normal tone and rate.   Volume:  Normal  Mood: Subjective anxiety. Not objectively anxious.   Affect:  Full Range and appropriate   Thought Process:  Linear  Orientation:  Full (Time, Place, and Person)  Thought Content:  No delusional theme. No preoccupation with violent thoughts. No negative ruminations. No obsession.  No hallucination in any modality.    Suicidal Thoughts:  No  Homicidal Thoughts:  No  Memory:  Immediate;   Good Recent;   Good Remote;   Good  Judgement:  Better   Insight:  Fair  Psychomotor Activity:  Normal  Concentration:  Concentration: Good and Attention Span: Good  Recall:  Good  Fund of Knowledge:  Good  Language:  Good  Akathisia:  Negative  Handed:    AIMS (if indicated):     Assets:  Architect Housing Intimacy Physical Health Resilience  ADL's:  Intact  Cognition:  WNL  Sleep:  Number of Hours: 6.5     Treatment Plan Summary: Patient is not depressed. Patient is not manic or psychotic. No evidence of dissociation. Would continue current regimen and evaluate her further.  Hopeful discharge in a tomorrow if she maintains progress.   Psychiatric: Borderline PD  Medical:  Psychosocial:   PLAN: 1. Continue current regimen 2. Continue to monitor mood, behavior and interaction with peers 3. Feedback from her family and hopeful discharge tomorrow.     Georgiann Cocker, MD 11/26/2017, 1:29 PMPatient ID: Joyice Faster, female   DOB: 08/31/1986, 32 y.o.   MRN: [id number]

## 2017-11-26 NOTE — Progress Notes (Signed)
D Patient is observed UAL on  the 400 hall today. She is seen interacting with her peers, watching tv with them and socializing with them as she sits in the dayroom. She has a pleasant, cooperative manner and affect. She takes her scheduled medications as planned. A She completed her daily assessment and on it she wrote she denied SI today and she rated her depression, hopelessness and anxeity " 0/0/7", respectively. She requested and was given a librium 25 mg, for c/o benzo withdrawal . She states she's been taking xanax for " at least 7 years" and is now saying she feels nervous, anxious, tremulous and craving something to " calm me down now!!". R Safety is in place.

## 2017-11-27 DIAGNOSIS — R4587 Impulsiveness: Secondary | ICD-10-CM

## 2017-11-27 DIAGNOSIS — F3164 Bipolar disorder, current episode mixed, severe, with psychotic features: Secondary | ICD-10-CM

## 2017-11-27 MED ORDER — GABAPENTIN 400 MG PO CAPS: 400.0000 mg | ORAL_CAPSULE | Freq: Three times a day (TID) | ORAL | 0 refills | Status: DC

## 2017-11-27 MED ORDER — TRAZODONE HCL 300 MG PO TABS: 300.0000 mg | ORAL_TABLET | Freq: Every day | ORAL | 0 refills | Status: DC

## 2017-11-27 MED ORDER — HYDROXYZINE HCL 50 MG PO TABS: 50.0000 mg | ORAL_TABLET | Freq: Three times a day (TID) | ORAL | 0 refills | Status: DC | PRN

## 2017-11-27 MED ORDER — ZIPRASIDONE HCL 40 MG PO CAPS: 40.0000 mg | ORAL_CAPSULE | Freq: Two times a day (BID) | ORAL | 0 refills | Status: DC
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
## 2017-11-27 NOTE — Progress Notes (Signed)
D. Pt presents with an animated affect and appropriate behavior. Observed initially sitting on bed in room, "working on coping skills".  Pt reports having slept poorly last night. Per pt's self inventory, pt rates her depression, hopelessness and anxiety a 0/0/8, respectively. Pt writes that her most important goal today is "home" and "survive, cope". Pt currently denies SI/HI and AVH  A. Labs and vitals monitored. Pt compliant with medications. Pt supported emotionally and encouraged to express concerns and ask questions.   R. Pt remains safe with 15 minute checks. Will continue POC.

## 2017-11-27 NOTE — Progress Notes (Signed)
  [facility] :  Will you be returning to the same living situation after discharge:  Yes,  with husband and son At discharge, do you have transportation home?: Yes,  patient arranged Do you have the ability to pay for your medications: Yes,  states there are no barriers  Release of information consent forms completed and turned in to Medical Records by CSW.  Patient to Follow up at: Follow-up Information    [facility] Follow up on 12/02/2017.   Specialty:  Internal Medicine Why:  Follow up is scheduled for 12/02/17 at 11:10am.  Please bring your discharge summary to the appointment.   Contact information: [address] [place] [id number] [phone]           Next level of care provider [facility]:no  Safety Planning and Suicide Prevention discussed: No.   Patient refused consent  Have you used any form of tobacco in the last 30 days? (Cigarettes, Smokeless Tobacco, Cigars, and/or Pipes): No  Has patient been referred to the Quitline?: N/A patient is not a smoker  Patient has been referred for addiction treatment: Pt. refused referral  Lynnell ChadMareida J Grossman-Orr, [name] 11/27/2017, 12:10 PM

## 2017-11-27 NOTE — [facility] Group Notes (Signed)
[facility] Group Notes:  (MHT Orientation Group) Date:  11/27/2017  Time:  9:19 AM  Type of Therapy:  Orientation  Participation Level:  Did Not Attend  Participation Quality:  Did not attend  Affect:  Did not attend  Cognitive:  Did not attend  Insight:  None  Engagement in Group:  None  Modes of Intervention:  Did not attend  Summary of Progress/Problems:  Brittany [name] 11/27/2017, 9:19 AM

## 2017-11-27 NOTE — [facility] Group Notes (Signed)
[name]  Date/Time:  11/27/2017 10:00-11:00AM  Type of Therapy and Topic:  Group Therapy:  Healthy and Unhealthy Supports  Participation Level:  Active   Description of Group:  Patients in this group were introduced to the idea of adding a variety of healthy supports to address the various needs in their lives.Patients discussed what additional healthy supports could be helpful in their recovery and wellness after discharge in order to prevent future hospitalizations.   An emphasis was placed on using counselor, doctor, therapy groups, 12-step groups, and problem-specific support groups to expand supports.  They also worked as a group on developing a specific plan for several patients to deal with unhealthy supports through boundary-setting, psychoeducation with loved ones, and even termination of relationships.   Therapeutic Goals:   1)  discuss importance of adding supports to stay well once out of the hospital  2)  compare healthy versus unhealthy supports and identify some examples of each  3)  generate ideas and descriptions of healthy supports that can be added  4)  offer mutual support about how to address unhealthy supports  5)  encourage active participation in and adherence to discharge plan    Summary of Patient Progress:  The patient expressed a willingness to add supports to help in her recovery journey.  She left before the end of group, however, and did not return.   Therapeutic Modalities:   Motivational Interviewing Brief Solution-Focused Therapy  Ambrose MantleMareida Grossman-Orr, [name]

## 2017-11-27 NOTE — [facility] Suicide Risk Assessment (Signed)
[facility]   Principal Problem: Borderline personality disorder [facility](HCC) Discharge Diagnoses:  Patient Active Problem List   Diagnosis Date Noted  . MDD (major depressive disorder), recurrent severe, without psychosis (HCC) [F33.2] 11/23/2017  . Bipolar 1 disorder, depressed, severe (HCC) [F31.4] 10/23/2017  . Bipolar 1 disorder (HCC) [F31.9] 07/18/2017  . Bipolar disorder, curr episode mixed, severe, with psychotic features (HCC) [F31.64] 06/08/2017  . ADHD (attention deficit hyperactivity disorder) [F90.9] 09/15/2016  . Borderline personality disorder (HCC) [F60.3] 05/04/2016  . Overdose [T50.901A] 05/03/2016  . Noncompliance [Z91.19] 05/03/2016    Total Time spent with patient: 45 minutes  Musculoskeletal: Strength & Muscle Tone: within normal limits Gait & Station: normal Patient leans: N/A  Psychiatric Specialty Exam: Review of Systems  Constitutional: Negative.   HENT: Negative.   Eyes: Negative.   Respiratory: Negative.   Cardiovascular: Negative.   Gastrointestinal: Negative.   Genitourinary: Negative.   Musculoskeletal: Negative.   Skin: Negative.   Neurological: Negative.   Endo/Heme/Allergies: Negative.   Psychiatric/Behavioral: Negative for depression, hallucinations, memory loss, substance abuse and suicidal ideas. The patient is not nervous/anxious and does not have insomnia.     Blood pressure 135/80, pulse 70, temperature 97.7 F (36.5 C), temperature source Oral, resp. rate 18, height 5\' 5"  (1.651 m), weight 117 kg (258 lb), SpO2 98 %.Body mass index is 42.93 kg/m.  General Appearance: Neatly dressed, pleasant, engaging well and cooperative. Appropriate behavior. Not in any distress. Good relatedness. Not internally stimulated  Eye Contact::  Good  Speech:  Spontaneous, normal prosody. Normal tone and rate.   Volume:  Normal  Mood:  Euthymic  Affect:  Appropriate and Full Range  Thought Process:  Linear  Orientation:  Full  (Time, Place, and Person)  Thought Content:  Future oriented. No delusional theme. No preoccupation with violent thoughts. No negative ruminations. No obsession.  No hallucination in any modality.   Suicidal Thoughts:  No  Homicidal Thoughts:  No  Memory:  Immediate;   Good Recent;   Good Remote;   Good  Judgement:  Good  Insight:  Good  Psychomotor Activity:  Normal  Concentration:  Good  Recall:  Good  Fund of Knowledge:Good  Language: Good  Akathisia:  Negative  Handed:    AIMS (if indicated):     Assets:  Communication Skills Desire for Improvement Housing Intimacy Physical Health Resilience  Sleep:  Number of Hours: 6.75  Cognition: WNL  ADL's:  Intact   Clinical Assessment::   31y.o Caucasian female, married, lives with her family. Background history ofBipolar disorder unspecified, BLPD. Presented to thethe unit inunaccompanied . Says she was referred by her psychiatrist. Reports feeling depressed lately. She was having suicidal thoughts. Says her her psychiatrist increased her medication and advised her to come into the hospital. Routine labsis significant for elevated lipids.. Toxicology is negative, UDS for THC and Benzodiazepines. No alcohol.  Seen today. Disappointed she did not leave yesterday. Says she is missing her family. Says she is in good spirits. She is not feeling depressed. She enjoyed the groups and unit activities. She is able to think clearly. She is able to focus on task. Her thoughts are not crowded or racing. No evidence of mania. No hallucination in any modality. She is not making any delusional statement. No passivity of will/thought. She is fully in touch with reality. No feelings of things being unreal. No feeling as if she is not real.  No thoughts of suicide. No thoughts of homicide. No violent  thoughts. No access to weapons.  Denies any stressors at home. No financial constraints. No relational difficulties. No legal issues.    Nursing staff  reports that patient has been appropriate on the unit. Patient has been interacting well with peers. No behavioral issues. Patient has not voiced any suicidal thoughts. Patient has not been observed to be internally stimulated. Patient has been adherent with treatment recommendations. Patient has been tolerating their medication well.   Patient was discussed at team. Team members feels that patient is back to her baseline level of function. Team agrees with plan to discharge patient today.   Demographic Factors:  NA  Loss Factors: Recent abandonment by her therapist.   Historical Factors: Impulsivity  Risk Reduction Factors:   Responsible for children under 83 years of age, Sense of responsibility to family, Living with another person, especially a relative, Positive social support, Positive therapeutic relationship and Positive coping skills or problem solving skills  Continued Clinical Symptoms:  As above   Cognitive Features That Contribute To Risk:  None    Suicide Risk:  Minimal: No identifiable suicidal ideation.  Patient is not having any thoughts of suicide at this time. Modifiable risk factors targeted during this admission includes mood instability and benzodiazepine dependence. . Demographical and historical risk factors cannot be modified. Patient is now engaging well. Patient is reliable and is future oriented. We have buffered patient's support structures. At this point, patient is at low risk of suicide. Patient is aware of the effects of psychoactive substances on decision making process. Patient has been provided with emergency contacts. Patient acknowledges to use resources provided if unforseen circumstances changes their current risk stratification.    Follow-up Information    [facility] Follow up on 12/02/2017.   Specialty:  Internal Medicine Why:  Follow up is scheduled for 12/02/17 at 11:10am.  Please bring your discharge summary to the  appointment.   Contact information: [address] [place] [address] [phone]           Plan Of Care/Follow-up recommendations:  1. Continue current psychotropic medications 2. Mental health and addiction follow up as arranged.  3. Discharge in care of her family 4. Provided limited quantity of prescriptions   Georgiann Cocker, MD 11/27/2017, 9:47 AM

## 2017-11-27 NOTE — Progress Notes (Signed)
Pt discharged to lobby. Pt was stable and appreciative at that time. All papers and prescriptions were given and valuables returned. Verbal understanding expressed. Denies SI/HI and A/VH. Pt given opportunity to express concerns and ask questions.  

## 2018-01-15 ENCOUNTER — Emergency Department (HOSPITAL_COMMUNITY)
Admission: EM | Admit: 2018-01-15 | Discharge: 2018-01-16 | Disposition: A | Discharge status: Home / Self Care | Payer: Medicaid Other | Attending: Emergency Medicine | Admitting: Emergency Medicine

## 2018-01-15 ENCOUNTER — Encounter (HOSPITAL_COMMUNITY): Payer: Self-pay | Admitting: Emergency Medicine

## 2018-01-15 ENCOUNTER — Ambulatory Visit (HOSPITAL_COMMUNITY)
Admission: RE | Admit: 2018-01-15 | Discharge: 2018-01-15 | Disposition: A | Discharge status: Home / Self Care | Payer: Medicaid Other | Attending: Psychiatry | Admitting: Psychiatry

## 2018-01-15 DIAGNOSIS — R45851 Suicidal ideations: Secondary | ICD-10-CM | POA: Diagnosis not present

## 2018-01-15 DIAGNOSIS — F314 Bipolar disorder, current episode depressed, severe, without psychotic features: Secondary | ICD-10-CM | POA: Insufficient documentation

## 2018-01-15 DIAGNOSIS — S161XXA Strain of muscle, fascia and tendon at neck level, initial encounter: Secondary | ICD-10-CM | POA: Diagnosis not present

## 2018-01-15 DIAGNOSIS — F32A Depression, unspecified: Secondary | ICD-10-CM

## 2018-01-15 DIAGNOSIS — Y9389 Activity, other specified: Secondary | ICD-10-CM | POA: Insufficient documentation

## 2018-01-15 DIAGNOSIS — Y92410 Unspecified street and highway as the place of occurrence of the external cause: Secondary | ICD-10-CM | POA: Diagnosis not present

## 2018-01-15 DIAGNOSIS — F329 Major depressive disorder, single episode, unspecified: Secondary | ICD-10-CM

## 2018-01-15 DIAGNOSIS — F332 Major depressive disorder, recurrent severe without psychotic features: Secondary | ICD-10-CM | POA: Diagnosis not present

## 2018-01-15 DIAGNOSIS — Z79899 Other long term (current) drug therapy: Secondary | ICD-10-CM | POA: Diagnosis not present

## 2018-01-15 DIAGNOSIS — Y999 Unspecified external cause status: Secondary | ICD-10-CM | POA: Diagnosis not present

## 2018-01-15 DIAGNOSIS — Z87891 Personal history of nicotine dependence: Secondary | ICD-10-CM | POA: Diagnosis not present

## 2018-01-15 DIAGNOSIS — Z818 Family history of other mental and behavioral disorders: Secondary | ICD-10-CM | POA: Diagnosis not present

## 2018-01-15 DIAGNOSIS — S199XXA Unspecified injury of neck, initial encounter: Secondary | ICD-10-CM | POA: Diagnosis present

## 2018-01-15 DIAGNOSIS — Z813 Family history of other psychoactive substance abuse and dependence: Secondary | ICD-10-CM | POA: Diagnosis not present

## 2018-01-15 LAB — SALICYLATE LEVEL: Salicylate Lvl: <7 mg/dL (ref 2.8–30.0)

## 2018-01-15 LAB — COMPREHENSIVE METABOLIC PANEL
ALT: 16 U/L (ref 14–54)
AST: 15 U/L (ref 15–41)
Albumin: 4.1 g/dL (ref 3.5–5.0)
Alkaline Phosphatase: 55 U/L (ref 38–126)
Anion gap: 10 (ref 5–15)
BUN: 16 mg/dL (ref 6–20)
CO2: 23 mmol/L (ref 22–32)
Calcium: 9.4 mg/dL (ref 8.9–10.3)
Chloride: 106 mmol/L (ref 101–111)
Creatinine, Ser: 0.93 mg/dL (ref 0.44–1.00)
GFR calc Af Amer: >60 mL/min (ref >60)
GFR calc non Af Amer: >60 mL/min (ref >60)
Glucose, Bld: 111 mg/dL — ABNORMAL HIGH (ref 65–99)
Potassium: 4.3 mmol/L (ref 3.5–5.1)
Sodium: 139 mmol/L (ref 135–145)
Total Bilirubin: 0.6 mg/dL (ref 0.3–1.2)
Total Protein: 7.4 g/dL (ref 6.5–8.1)

## 2018-01-15 LAB — I-STAT BETA HCG BLOOD, ED (MC, WL, AP ONLY)

## 2018-01-15 LAB — CBC
HEMATOCRIT: 43.3 % (ref 36.0–46.0)
Hemoglobin: 14.5 g/dL (ref 12.0–15.0)
MCH: 31.3 pg (ref 26.0–34.0)
MCHC: 33.5 g/dL (ref 30.0–36.0)
MCV: 93.5 fL (ref 78.0–100.0)
Platelets: 221 10*3/uL (ref 150–400)
RBC: 4.63 MIL/uL (ref 3.87–5.11)
RDW: 12.6 % (ref 11.5–15.5)
WBC: 8.6 10*3/uL (ref 4.0–10.5)

## 2018-01-15 LAB — RAPID URINE DRUG SCREEN, HOSP PERFORMED
Amphetamines: NOT DETECTED
BARBITURATES: NOT DETECTED
Benzodiazepines: POSITIVE — AB
Cocaine: NOT DETECTED
Opiates: NOT DETECTED
Tetrahydrocannabinol: POSITIVE — AB

## 2018-01-15 LAB — ACETAMINOPHEN LEVEL

## 2018-01-15 LAB — ETHANOL: Alcohol, Ethyl (B): <10 mg/dL (ref <10)

## 2018-01-15 MED ORDER — IBUPROFEN 400 MG PO TABS: 400.0000 mg | ORAL_TABLET | Freq: Four times a day (QID) | ORAL | 0 refills | Status: DC | PRN

## 2018-01-15 MED ORDER — GABAPENTIN 400 MG PO CAPS
400.0000 mg | ORAL_CAPSULE | Freq: Three times a day (TID) | ORAL | Status: DC
Start: 2018-01-15 — End: 2018-01-16
  Administered 2018-01-15 – 2018-01-16 (×3): 400 mg via ORAL
  Filled 2018-01-15 (×3): qty 1

## 2018-01-15 MED ORDER — TRAZODONE HCL 100 MG PO TABS
300.0000 mg | ORAL_TABLET | Freq: Every day | ORAL | Status: DC
  Administered 2018-01-15: 300 mg via ORAL
  Filled 2018-01-15: qty 3

## 2018-01-15 MED ORDER — LORAZEPAM 1 MG PO TABS
1.0000 mg | ORAL_TABLET | Freq: Once | ORAL | Status: DC
  Administered 2018-01-15: 1 mg via ORAL
  Filled 2018-01-15: qty 1

## 2018-01-15 MED ORDER — METHOCARBAMOL 500 MG PO TABS: 500.0000 mg | ORAL_TABLET | Freq: Three times a day (TID) | ORAL | 0 refills | Status: DC | PRN

## 2018-01-15 MED ORDER — HYDROXYZINE HCL 25 MG PO TABS: 50.0000 mg | ORAL_TABLET | Freq: Three times a day (TID) | ORAL | Status: DC | PRN

## 2018-01-15 MED ORDER — METHOCARBAMOL 500 MG PO TABS
500.0000 mg | ORAL_TABLET | Freq: Once | ORAL | Status: AC
  Administered 2018-01-15: 500 mg via ORAL
  Filled 2018-01-15: qty 1

## 2018-01-15 MED ORDER — IBUPROFEN 200 MG PO TABS
400.0000 mg | ORAL_TABLET | Freq: Once | ORAL | Status: AC
  Administered 2018-01-15: 400 mg via ORAL
  Filled 2018-01-15: qty 2

## 2018-01-15 MED ORDER — ZIPRASIDONE HCL 20 MG PO CAPS
40.0000 mg | ORAL_CAPSULE | Freq: Two times a day (BID) | ORAL | Status: DC
Start: 2018-01-15 — End: 2018-01-16
  Administered 2018-01-15 – 2018-01-16 (×2): 40 mg via ORAL
  Filled 2018-01-15 (×2): qty 2
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
## 2018-01-15 NOTE — ED Notes (Signed)
Patient silver colored ring placed in specimen cup and put in patient belonging bag and [facility]

## 2018-01-15 NOTE — ED Notes (Signed)
Patient seen resting 

## 2018-01-15 NOTE — ED Notes (Signed)
Bed: WA28 Expected date:  Expected time:  Means of arrival:  Comments: 
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
## 2018-01-15 NOTE — ED Notes (Signed)
1 Patient belonging bag placed in locker # [id number]

## 2018-01-15 NOTE — ED Notes (Signed)
[facility].

## 2018-01-15 NOTE — ED Notes (Signed)
Patient awake. Cheerful and pleasant. Requested snack and drink. Denies pain, SI/HI, AH/VH at this time. [facility].  No behavior issues noted. Will continue to monitor patient. Safety checks maintained.
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
## 2018-01-16 DIAGNOSIS — Z818 Family history of other mental and behavioral disorders: Secondary | ICD-10-CM | POA: Diagnosis not present

## 2018-01-16 DIAGNOSIS — Z813 Family history of other psychoactive substance abuse and dependence: Secondary | ICD-10-CM | POA: Diagnosis not present

## 2018-01-16 DIAGNOSIS — Z87891 Personal history of nicotine dependence: Secondary | ICD-10-CM

## 2018-01-16 DIAGNOSIS — F332 Major depressive disorder, recurrent severe without psychotic features: Secondary | ICD-10-CM | POA: Diagnosis not present

## 2018-01-16 NOTE — [facility] Suicide Risk Assessment (Signed)
Suicide Risk Assessment  Discharge Assessment   [facility]   Principal Problem: MDD (major depressive disorder), recurrent severe, without psychosis (HCC) Discharge Diagnoses:  Patient Active Problem List   Diagnosis Date Noted  . MDD (major depressive disorder), recurrent severe, without psychosis (HCC) [F33.2] 11/23/2017  . Bipolar 1 disorder, depressed, severe (HCC) [F31.4] 10/23/2017  . Bipolar 1 disorder (HCC) [F31.9] 07/18/2017  . Bipolar disorder, curr episode mixed, severe, with psychotic features (HCC) [F31.64] 06/08/2017  . ADHD (attention deficit hyperactivity disorder) [F90.9] 09/15/2016  . Borderline personality disorder (HCC) [F60.3] 05/04/2016  . Overdose [T50.901A] 05/03/2016  . Noncompliance [Z91.19] 05/03/2016    Total Time spent with patient: 45 minutes  Musculoskeletal: Strength & Muscle Tone: within normal limits Gait & Station: normal Patient leans: N/A  Psychiatric Specialty Exam: Physical Exam  Constitutional: She is oriented to person, place, and time. She appears well-developed and well-nourished.  Respiratory: Effort normal.  Musculoskeletal: Normal range of motion.  Neurological: She is alert and oriented to person, place, and time.  Psychiatric: Her speech is normal and behavior is normal. Judgment and thought content normal. Cognition and memory are normal. She exhibits a depressed mood.   Review of Systems  Psychiatric/Behavioral: Positive for depression. Negative for hallucinations, memory loss, substance abuse and suicidal ideas. The patient is not nervous/anxious and does not have insomnia.   All other systems reviewed and are negative.  Blood pressure 104/64, pulse 78, temperature 97.6 F (36.4 C), resp. rate 20, height 5\' 4"  (1.626 m), weight 113.4 kg (250 lb), SpO2 98 %.Body mass index is 42.91 kg/m. General Appearance: Casual Eye Contact:  Good Speech:  Clear and Coherent and Normal Rate Volume:   Normal Mood:  Depressed Affect:  Congruent and Depressed Thought Process:  Coherent, Goal Directed and Linear Orientation:  Full (Time, Place, and Person) Thought Content:  Logical Suicidal Thoughts:  No Homicidal Thoughts:  No Memory:  Immediate;   Good Recent;   Good Remote;   Fair Judgement:  Fair Insight:  Fair Psychomotor Activity:  Normal Concentration:  Concentration: Good and Attention Span: Good Recall:  Good Fund of Knowledge:  Good Language:  Good Akathisia:  No Handed:  Right AIMS (if indicated):    Assets:  ArchitectCommunication Skills Financial Resources/Insurance Housing Social Support ADL's:  Intact Cognition:  WNL   Mental Status Per Nursing Assessment::   On Admission:   Depressed with suicidal ideation  Demographic Factors:  Caucasian, Low socioeconomic status and Unemployed  Loss Factors: Legal issues and Financial problems/change in socioeconomic status  Historical Factors: Impulsivity  Risk Reduction Factors:   Sense of responsibility to family and Living with another person, especially a relative  Continued Clinical Symptoms:  Severe Anxiety and/or Agitation Bipolar Disorder:   Depressive phase Depression:   Impulsivity Previous Psychiatric Diagnoses and Treatments  Cognitive Features That Contribute To Risk:  Closed-mindedness    Suicide Risk:  Minimal: No identifiable suicidal ideation.  Patients presenting with no risk factors but with morbid ruminations; may be classified as minimal risk based on the severity of the depressive symptoms    Plan Of Care/Follow-up recommendations:  Activity:  as tolerated Diet:  Heart Healthy  Laveda AbbeLaurie Britton Livan Hires, NP 01/16/2018, 12:10 PM

## 2018-01-16 NOTE — BH Assessment (Signed)
[facility]  Per Juanetta BeetsJacqueline Norman, DO, this pt does not require psychiatric hospitalization at this time.  [facility] [place]  This has been included in pt's discharge instructions.  Pt's nurse, Diane, has been notified.  Doylene Canninghomas Judah Carchi, MA Triage Specialist [phone]

## 2018-01-16 NOTE — ED Notes (Signed)
Pt discharged home. Discharged instructions read to pt who verbalized understanding. All belongings returned to pt who signed for same. Denies SI/HI, is not delusional and not responding to internal stimuli. Escorted pt to the ED exit.    
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
## 2018-01-16 NOTE — Discharge Instructions (Signed)
For your behavioral health needs, you are advised to follow up with [facility].  Contact them at your earliest opportunity to ask about scheduling an intake appointment: ° °     [facility] °     [address] °     [place], [place] [address] °     [phone] °

## 2018-11-08 ENCOUNTER — Other Ambulatory Visit: Payer: Self-pay | Admitting: Registered Nurse

## 2018-11-08 ENCOUNTER — Encounter (HOSPITAL_COMMUNITY): Payer: Self-pay | Admitting: *Deleted

## 2018-11-08 ENCOUNTER — Other Ambulatory Visit: Payer: Self-pay

## 2018-11-08 ENCOUNTER — Inpatient Hospital Stay (HOSPITAL_COMMUNITY)
Admission: RE | Admit: 2018-11-08 | Discharge: 2018-11-11 | DRG: 885 | Disposition: A | Discharge status: Home / Self Care | Payer: Medicare Other | Attending: Psychiatry | Admitting: Psychiatry

## 2018-11-08 DIAGNOSIS — Z91410 Personal history of adult physical and sexual abuse: Secondary | ICD-10-CM

## 2018-11-08 DIAGNOSIS — F17210 Nicotine dependence, cigarettes, uncomplicated: Secondary | ICD-10-CM | POA: Diagnosis present

## 2018-11-08 DIAGNOSIS — G4700 Insomnia, unspecified: Secondary | ICD-10-CM | POA: Diagnosis present

## 2018-11-08 DIAGNOSIS — F314 Bipolar disorder, current episode depressed, severe, without psychotic features: Secondary | ICD-10-CM | POA: Diagnosis not present

## 2018-11-08 DIAGNOSIS — Z915 Personal history of self-harm: Secondary | ICD-10-CM

## 2018-11-08 DIAGNOSIS — Z9049 Acquired absence of other specified parts of digestive tract: Secondary | ICD-10-CM | POA: Diagnosis not present

## 2018-11-08 DIAGNOSIS — F603 Borderline personality disorder: Secondary | ICD-10-CM | POA: Diagnosis present

## 2018-11-08 DIAGNOSIS — F3164 Bipolar disorder, current episode mixed, severe, with psychotic features: Principal | ICD-10-CM | POA: Diagnosis present

## 2018-11-08 DIAGNOSIS — F909 Attention-deficit hyperactivity disorder, unspecified type: Secondary | ICD-10-CM | POA: Diagnosis present

## 2018-11-08 DIAGNOSIS — R45851 Suicidal ideations: Secondary | ICD-10-CM | POA: Diagnosis present

## 2018-11-08 DIAGNOSIS — F17290 Nicotine dependence, other tobacco product, uncomplicated: Secondary | ICD-10-CM | POA: Diagnosis present

## 2018-11-08 DIAGNOSIS — Z79899 Other long term (current) drug therapy: Secondary | ICD-10-CM

## 2018-11-08 DIAGNOSIS — F1210 Cannabis abuse, uncomplicated: Secondary | ICD-10-CM | POA: Diagnosis present

## 2018-11-08 DIAGNOSIS — Z818 Family history of other mental and behavioral disorders: Secondary | ICD-10-CM

## 2018-11-08 DIAGNOSIS — F333 Major depressive disorder, recurrent, severe with psychotic symptoms: Secondary | ICD-10-CM | POA: Diagnosis present

## 2018-11-08 LAB — COMPREHENSIVE METABOLIC PANEL
ALT: 19 U/L (ref 0–44)
AST: 18 U/L (ref 15–41)
Albumin: 4.3 g/dL (ref 3.5–5.0)
Alkaline Phosphatase: 59 U/L (ref 38–126)
Anion gap: 10 (ref 5–15)
BUN: 14 mg/dL (ref 6–20)
CHLORIDE: 103 mmol/L (ref 98–111)
CO2: 23 mmol/L (ref 22–32)
Calcium: 9.5 mg/dL (ref 8.9–10.3)
Creatinine, Ser: 1.01 mg/dL — ABNORMAL HIGH (ref 0.44–1.00)
GFR calc Af Amer: >60 mL/min (ref >60)
GFR calc non Af Amer: >60 mL/min (ref >60)
Glucose, Bld: 105 mg/dL — ABNORMAL HIGH (ref 70–99)
Potassium: 3.5 mmol/L (ref 3.5–5.1)
Sodium: 136 mmol/L (ref 135–145)
Total Bilirubin: 0.4 mg/dL (ref 0.3–1.2)
Total Protein: 7.2 g/dL (ref 6.5–8.1)

## 2018-11-08 LAB — CBC
HCT: 43.3 % (ref 36.0–46.0)
Hemoglobin: 13.8 g/dL (ref 12.0–15.0)
MCH: 31.5 pg (ref 26.0–34.0)
MCHC: 31.9 g/dL (ref 30.0–36.0)
MCV: 98.9 fL (ref 80.0–100.0)
Platelets: 174 10*3/uL (ref 150–400)
RBC: 4.38 MIL/uL (ref 3.87–5.11)
RDW: 13.1 % (ref 11.5–15.5)
WBC: 7.4 10*3/uL (ref 4.0–10.5)
nRBC: 0 % (ref 0.0–0.2)

## 2018-11-08 LAB — URINALYSIS, COMPLETE (UACMP) WITH MICROSCOPIC
Bilirubin Urine: NEGATIVE
Glucose, UA: NEGATIVE mg/dL
HGB URINE DIPSTICK: NEGATIVE
Ketones, ur: NEGATIVE mg/dL
Leukocytes, UA: NEGATIVE
Nitrite: NEGATIVE
Protein, ur: NEGATIVE mg/dL
Specific Gravity, Urine: 1.025 (ref 1.005–1.030)
pH: 5 (ref 5.0–8.0)

## 2018-11-08 LAB — RAPID URINE DRUG SCREEN, HOSP PERFORMED
Amphetamines: NOT DETECTED
BARBITURATES: NOT DETECTED
Benzodiazepines: POSITIVE — AB
Cocaine: NOT DETECTED
Opiates: NOT DETECTED
Tetrahydrocannabinol: POSITIVE — AB

## 2018-11-08 LAB — LIPID PANEL
CHOL/HDL RATIO: 6.3 ratio
Cholesterol: 239 mg/dL — ABNORMAL HIGH (ref 0–200)
HDL: 38 mg/dL — ABNORMAL LOW (ref >40)
LDL Cholesterol: 138 mg/dL — ABNORMAL HIGH (ref 0–99)
TRIGLYCERIDES: 315 mg/dL — AB (ref <150)
VLDL: 63 mg/dL — ABNORMAL HIGH (ref 0–40)

## 2018-11-08 LAB — ETHANOL: Alcohol, Ethyl (B): <10 mg/dL (ref <10)

## 2018-11-08 LAB — TSH: TSH: 4.447 u[IU]/mL (ref 0.350–4.500)

## 2018-11-08 LAB — PREGNANCY, URINE: Preg Test, Ur: NEGATIVE

## 2018-11-08 MED ORDER — TRAZODONE HCL 150 MG PO TABS
300.0000 mg | ORAL_TABLET | Freq: Every day | ORAL | Status: DC
  Administered 2018-11-08 – 2018-11-10 (×2): 300 mg via ORAL
  Filled 2018-11-08 (×5): qty 2

## 2018-11-08 MED ORDER — ALPRAZOLAM 0.5 MG PO TABS
0.5000 mg | ORAL_TABLET | Freq: Every day | ORAL | Status: DC | PRN
  Administered 2018-11-08: 0.5 mg via ORAL
  Filled 2018-11-08: qty 1

## 2018-11-08 MED ORDER — NICOTINE 21 MG/24HR TD PT24
21.0000 mg | MEDICATED_PATCH | Freq: Every day | TRANSDERMAL | Status: DC
  Administered 2018-11-08 – 2018-11-10 (×3): 21 mg via TRANSDERMAL
  Filled 2018-11-08 (×6): qty 1

## 2018-11-08 MED ORDER — HYDROXYZINE HCL 25 MG PO TABS
25.0000 mg | ORAL_TABLET | Freq: Three times a day (TID) | ORAL | Status: DC | PRN
  Administered 2018-11-08 – 2018-11-10 (×2): 25 mg via ORAL
  Filled 2018-11-08 (×3): qty 1

## 2018-11-08 NOTE — Progress Notes (Signed)
Brittany [name] is a 33 year old female pt admitted on voluntary basis after presenting as a walk-in. On admission, she is tearful and anxious and reports that she has been feeling depressed and suicidal recently and reports that she is afraid of what she might do to herself. She is currently suicidal but she is able to contract for safety while in the hospital. She does endorse auditory hallucinations and reports that she hears her voice telling her to do bad things to herself. She reports that she takes her medications as prescribed and reports that she has recently had some medication changes. She does endorse marijuana usage but denies any other current substance use. She reports that she lives with her husband and son and will return there once she is discharged. Brittany [name] was escorted to the unit, oriented to the milieu and safety maintained.

## 2018-11-08 NOTE — H&P (Signed)
Behavioral Health Medical Screening Exam  Brittany [name] is an 33 y.o. female patient presents as walk in at [facility] with complaints of worsening depression and suicidal ideation plan to [place].  Prior history of suicide attempt.    Total Time spent with patient: 30 minutes  Psychiatric Specialty Exam: Physical Exam  Constitutional: She is oriented to person, place, and time. She appears well-developed and well-nourished.  Neck: Normal range of motion. Neck supple.  Respiratory: Effort normal.  Musculoskeletal: Normal range of motion.  Neurological: She is alert and oriented to person, place, and time.  Skin: Skin is warm and dry.  Psychiatric: Her mood appears anxious. She is actively hallucinating. Cognition and memory are normal. She expresses impulsivity. She exhibits a depressed mood. She expresses suicidal ideation. She expresses suicidal plans.    Review of Systems  Psychiatric/Behavioral: Positive for depression, hallucinations and suicidal ideas. The patient is nervous/anxious and has insomnia.   All other systems reviewed and are negative.   There were no vitals taken for this visit.There is no height or weight on file to calculate BMI.  General Appearance: Casual  Eye Contact:  Good  Speech:  Clear and Coherent and Normal Rate  Volume:  Normal  Mood:  Hopeless  Affect:  Depressed, Flat and Tearful  Thought Process:  Coherent and Goal Directed  Orientation:  Full (Time, Place, and Person)  Thought Content:  Hallucinations: Auditory Command:  Telling her to kill herself  Suicidal Thoughts:  Yes.  with intent/plan  Homicidal Thoughts:  No  Memory:  Immediate;   Good Recent;   Good Remote;   Good  Judgement:  Impaired  Insight:  Fair  Psychomotor Activity:  Normal  Concentration: Concentration: Fair and Attention Span: Fair  Recall:  Good  Fund of Knowledge:Good  Language: Good  Akathisia:  No  Handed:  Right  AIMS (if indicated):     Assets:   Communication Skills Desire for Improvement Housing Physical Health Social Support  Sleep:       Musculoskeletal: Strength & Muscle Tone: within normal limits Gait & Station: normal Patient leans: N/A  There were no vitals taken for this visit.  Recommendations: Inpatient psychiatric treatment  Based on my evaluation the patient does not appear to have an emergency medical condition.   , NP 11/08/2018, 2:39 PM
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
## 2018-11-08 NOTE — Tx Team (Signed)
Initial Treatment Plan 11/08/2018 6:05 PM Brittany [name] MCN:[id number]    PATIENT STRESSORS: Marital or family conflict Medication change or noncompliance   PATIENT STRENGTHS: Ability for insight Average or above average intelligence Capable of independent living General fund of knowledge Motivation for treatment/growth Supportive family/friends   PATIENT IDENTIFIED PROBLEMS: Depression Suicidal thoughts Recent medication changes "I need help, I'm scared of myself"                     DISCHARGE CRITERIA:  Ability to meet basic life and health needs Improved stabilization in mood, thinking, and/or behavior Reduction of life-threatening or endangering symptoms to within safe limits Verbal commitment to aftercare and medication compliance  PRELIMINARY DISCHARGE PLAN: Attend aftercare/continuing care group Return to previous living arrangement  PATIENT/FAMILY INVOLVEMENT: This treatment plan has been presented to and reviewed with the patient, Brittany [name], and/or family member, .  The patient and family have been given the opportunity to ask questions and make suggestions.  Hermen Mario, [place], [place] 11/08/2018, 6:05 PM

## 2018-11-08 NOTE — Progress Notes (Signed)
Patient ID: Brittany [name], female   DOB: Apr 24, 1986, 33 y.o.   MRN: [id number] Per State regulations 482.30 this chart was reviewed for medical necessity with respect to the patient's admission/duration of stay.    Next review date: 11/12/2018  Thurman Coyer, BSN, RN-BC  Case Manager

## 2018-11-08 NOTE — Progress Notes (Signed)
D: Pt was in dayroom upon initial approach.  Pt presents with anxious, depressed affect and mood.  She describes her day as "shitty" and discussed the events of her day.  She reports that prior to admission, "I haven't left the house, I've been so depressed, so suicidal."  Pt discussed how she had some medication changes recently but did not feel they were beneficial.  Her goal is to "feel better, get back in my groove."  Pt requests Trazodone and Xanax, reporting she took these medications prior to admission and "I don't want to go into withdrawal."  Pt denies HI, denies hallucinations, denies pain.  Pt endorses SI and she verbally contracts for safety.  Pt has been visible in milieu interacting with peers and staff appropriately.  Pt attended evening group.    A: Introduced self to pt.  Met with pt 1:1.  Actively listened to pt and offered support and encouragement.  On-site provider contacted for medication orders.  Medication administered per order.  PRN medication administered for sleep.  Q15 minute safety checks maintained.  R: Pt is safe on the unit.  Pt is compliant with medications.  Pt verbally contracts for safety.  Will continue to monitor and assess.

## 2018-11-09 LAB — HEMOGLOBIN A1C
Hgb A1c MFr Bld: 5.1 % (ref 4.8–5.6)
Mean Plasma Glucose: 99.67 mg/dL

## 2018-11-09 MED ORDER — DESVENLAFAXINE SUCCINATE ER 100 MG PO TB24
100.0000 mg | ORAL_TABLET | Freq: Every day | ORAL | Status: DC
  Administered 2018-11-09 – 2018-11-11 (×3): 100 mg via ORAL
  Filled 2018-11-09 (×4): qty 1

## 2018-11-09 MED ORDER — ZIPRASIDONE HCL 60 MG PO CAPS
120.0000 mg | ORAL_CAPSULE | Freq: Every day | ORAL | Status: DC
  Administered 2018-11-09 – 2018-11-10 (×2): 120 mg via ORAL
  Filled 2018-11-09 (×3): qty 2

## 2018-11-09 MED ORDER — ZIPRASIDONE HCL 60 MG PO CAPS
60.0000 mg | ORAL_CAPSULE | Freq: Every day | ORAL | Status: DC
  Administered 2018-11-09 – 2018-11-11 (×3): 60 mg via ORAL
  Filled 2018-11-09 (×5): qty 1

## 2018-11-09 MED ORDER — ALPRAZOLAM 1 MG PO TABS
2.0000 mg | ORAL_TABLET | Freq: Three times a day (TID) | ORAL | Status: DC
  Administered 2018-11-09 – 2018-11-11 (×6): 2 mg via ORAL
  Filled 2018-11-09 (×6): qty 2

## 2018-11-09 NOTE — Progress Notes (Signed)
Adult Psychoeducational Group Note  Date:  11/09/2018 Time:  10:31 PM  Group Topic/Focus:  Wrap-Up Group:   The focus of this group is to help patients review their daily goal of treatment and discuss progress on daily workbooks.  Participation Level:  Active  Participation Quality:  Appropriate  Affect:  Appropriate  Cognitive:  Alert and Oriented  Insight: Appropriate  Engagement in Group:  Developing/Improving  Modes of Intervention:  Clarification, Exploration and Support  Additional Comments:  Pt verbalized that her day was a 7. Pt verbalized that something positive was that she was able to get some sleep. Pt verbalized that her goal was to work on Pharmacologist.  Pt verbalized that coping skills that she can use are talking about her feelings and not blaming herself.  Brittany [name], Randal Buba 11/09/2018, 10:31 PM
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
## 2018-11-09 NOTE — Progress Notes (Signed)
D: Pt denies SI/HI/AVH.  A: Pt was offered support and encouragement.  Pt was encourage to attend groups. Q 15 minute checks were done for safety.  R:Pt attends groups and interacts well with peers and staff. Pt is taking medication. Pt has no complaints.Pt receptive to treatment and safety maintained on unit.
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
## 2018-11-09 NOTE — Plan of Care (Signed)
Nurse discussed anxiety, depression and coping skills with patient.  

## 2018-11-09 NOTE — [facility] Group Notes (Signed)
[facility] Mental Health Association Group Therapy      11/09/2018 3:39 PM  Type of Therapy: Mental Health Association Presentation  Participation Level: Active  Participation Quality: Attentive  Affect: Appropriate  Cognitive: Oriented  Insight: Developing/Improving  Engagement in Therapy: Engaged  Modes of Intervention: Discussion, Education and Socialization  Summary of Progress/Problems: Mental Health Association (MHA) Speaker came to talk about his personal journey with mental health. The pt processed ways by which to relate to the speaker. MHA speaker provided handouts and educational information pertaining to groups and services offered by the MHA. Pt was engaged in speaker's presentation and was receptive to resources provided.    Blinda Turek LCSWA Clinical Social Worker   

## 2018-11-09 NOTE — Progress Notes (Signed)
D:  Patient's self inventory sheet, patient has poor sleep, sleep medication not helpful.  Fair appetite, low energy level, good concentratioan.  Rated depression and hopeleess 1, anxiety 6.  Withdrawals, agitation, runny nose, irritability.  Denied SI.  Denied physical problems.  Denied physical pain.  Goal is attend groups.  Plans to learn coping skills.  Does have discharge plans. A:  Medications administered per MD orders.  Emotional support and encouragement given patient. R:  Denied SI and HI, contracts for safety.  Denied A/V hallucinations.  Safety maintained with 15 minute checks.

## 2018-11-09 NOTE — Progress Notes (Signed)
Adult Psychoeducational Group Note  Date:  11/09/2018 Time:  8:04 AM  Group Topic/Focus:  Wrap-Up Group:   The focus of this group is to help patients review their daily goal of treatment and discuss progress on daily workbooks.  Participation Level:  Active  Participation Quality:  Appropriate and Attentive  Affect:  Appropriate  Cognitive:  Alert and Appropriate  Insight: Appropriate and Good  Engagement in Group:  Engaged  Modes of Intervention:  Discussion  Additional Comments:  Pt attend wrap up group. Her day was a 2. The one positive thing that happen to her today she got admitted.  Charna Busman Long 11/09/2018, 8:04 AM
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
## 2018-11-09 NOTE — Progress Notes (Signed)
Patient stated she takes benadryl or claritin at home for her allergies and would like to take either one while a patient at [facility].

## 2018-11-10 DIAGNOSIS — F314 Bipolar disorder, current episode depressed, severe, without psychotic features: Secondary | ICD-10-CM

## 2018-11-10 NOTE — Progress Notes (Signed)
Recreation Therapy Notes  Date:  1.31.20 Time: 0930 Location: [address] Dayroom  Group Topic: Stress Management  Goal Area(s) Addresses:  Patient will identify positive stress management techniques. Patient will identify benefits of using stress management post d/c.  Intervention: Stress Management  Activity :  Progressive Muscle Relaxation.  LRT introduced the stress management technique of progressive muscle relaxation.  LRT read a script that guided patients through the process of tensing and relaxing each muscle group individually.  Patients were to follow along as script was read to engage in activity.  Education:  Stress Management, Discharge Planning.   Education Outcome: Acknowledges Education  Clinical Observations/Feedback:  Pt did not attend group.     Caroll Rancher, LRT/CTRS        Caroll Rancher A 11/10/2018 11:21 AM

## 2018-11-10 NOTE — Progress Notes (Signed)
D: Patient is sleeping and eating well; her energy level is normal; his concentration is good.  She rates her depression as a 3; she denies any hopelessness; her anxiety is a 5.  Her goal today is to "attend groups." She is visible in the milieu and she denies any thoughts of self harm.  A: Continue to monitor medication management and MD orders.  Safety checks completed every 15 minutes per protocol.  Offer support and encouragement as needed.  R: Patient is receptive to staff; his/her behavior is appropriate.

## 2018-11-10 NOTE — Tx Team (Signed)
Interdisciplinary Treatment and Diagnostic Plan Update  11/10/2018 Time of Session:  Brittany GreavesChristine G [name] MRN: [id number]  Principal Diagnosis: <principal problem not specified>  Secondary Diagnoses: Active Problems:   MDD (major depressive disorder), recurrent, severe, with psychosis (HCC)   Current Medications:  Current Facility-Administered Medications  Medication Dose Route Frequency Provider Last Rate Last Dose  . ALPRAZolam Prudy Feeler(XANAX) tablet 2 mg  2 mg Oral TID Antonieta Pertlary, Greg Lawson, MD   2 mg at 11/10/18 0807  . desvenlafaxine (PRISTIQ) 24 hr tablet 100 mg  100 mg Oral Daily Antonieta Pertlary, Greg Lawson, MD   100 mg at 11/10/18 [id number]  . hydrOXYzine (ATARAX/VISTARIL) tablet 25 mg  25 mg Oral TID PRN Rankin, Shuvon B, NP   25 mg at 11/08/18 1806  . nicotine (NICODERM CQ - dosed in mg/24 hours) patch 21 mg  21 mg Transdermal Daily Rankin, Shuvon B, NP   21 mg at 11/10/18 0806  . traZODone (DESYREL) tablet 300 mg  300 mg Oral QHS Kerry HoughSimon, Spencer E, PA-C   300 mg at 11/08/18 2116  . ziprasidone (GEODON) capsule 120 mg  120 mg Oral Q supper Antonieta Pertlary, Greg Lawson, MD   120 mg at 11/09/18 1658  . ziprasidone (GEODON) capsule 60 mg  60 mg Oral Daily Antonieta Pertlary, Greg Lawson, MD   60 mg at 11/10/18 [id number]   PTA Medications: Medications Prior to Admission  Medication Sig Dispense Refill Last Dose  . alprazolam (XANAX) 2 MG tablet Take 2 mg by mouth 3 (three) times daily.     Marland Kitchen. amphetamine-dextroamphetamine (ADDERALL) 15 MG tablet Take 15 mg by mouth daily with lunch.     . amphetamine-dextroamphetamine (ADDERALL) 30 MG tablet Take 30 mg by mouth every morning.      . desvenlafaxine (PRISTIQ) 100 MG 24 hr tablet Take 100 mg by mouth every morning.     . trazodone (DESYREL) 300 MG tablet Take 1 tablet (300 mg total) by mouth at bedtime. 30 tablet 0 Not Taking at Unknown time  . ziprasidone (GEODON) 60 MG capsule Take 120 mg by mouth 2 (two) times daily with a meal.     . gabapentin (NEURONTIN) 400 MG capsule Take 1 capsule  (400 mg total) by mouth 3 (three) times daily. For agitation (Patient not taking: Reported on 01/16/2018) 90 capsule 0 Not Taking at Unknown time  . hydrOXYzine (ATARAX/VISTARIL) 50 MG tablet Take 1 tablet (50 mg total) by mouth 3 (three) times daily as needed for anxiety. (Patient not taking: Reported on 01/16/2018) 30 tablet 0 Not Taking at Unknown time  . ibuprofen (ADVIL,MOTRIN) 400 MG tablet Take 1 tablet (400 mg total) by mouth every 6 (six) hours as needed. (Patient not taking: Reported on 11/09/2018) 30 tablet 0 Not Taking at Unknown time  . methocarbamol (ROBAXIN) 500 MG tablet Take 1 tablet (500 mg total) by mouth every 8 (eight) hours as needed for muscle spasms. (Patient not taking: Reported on 11/09/2018) 20 tablet 0 Not Taking at Unknown time  . ziprasidone (GEODON) 40 MG capsule Take 1 capsule (40 mg total) by mouth 2 (two) times daily with a meal. (Patient not taking: Reported on 01/16/2018) 60 capsule 0 Not Taking at Unknown time    Patient Stressors: Marital or family conflict Medication change or noncompliance  Patient Strengths: Ability for insight Average or above average intelligence Capable of independent living General fund of knowledge Motivation for treatment/growth Supportive family/friends  Treatment Modalities: Medication Management, Group therapy, Case management,  1 to 1 session with clinician,  Psychoeducation, Recreational therapy.   Physician Treatment Plan for Primary Diagnosis: <principal problem not specified> Long Term Goal(s): Improvement in symptoms so as ready for discharge Improvement in symptoms so as ready for discharge   Short Term Goals: Ability to identify changes in lifestyle to reduce recurrence of condition will improve Ability to verbalize feelings will improve Ability to disclose and discuss suicidal ideas Ability to demonstrate self-control will improve Ability to identify and develop effective coping behaviors will improve Ability to maintain  clinical measurements within normal limits will improve Compliance with prescribed medications will improve Ability to identify changes in lifestyle to reduce recurrence of condition will improve Ability to verbalize feelings will improve Ability to disclose and discuss suicidal ideas Ability to demonstrate self-control will improve Ability to identify and develop effective coping behaviors will improve Ability to maintain clinical measurements within normal limits will improve  Medication Management: Evaluate patient's response, side effects, and tolerance of medication regimen.  Therapeutic Interventions: 1 to 1 sessions, Unit Group sessions and Medication administration.  Evaluation of Outcomes: Progressing  Physician Treatment Plan for Secondary Diagnosis: Active Problems:   MDD (major depressive disorder), recurrent, severe, with psychosis (HCC)  Long Term Goal(s): Improvement in symptoms so as ready for discharge Improvement in symptoms so as ready for discharge   Short Term Goals: Ability to identify changes in lifestyle to reduce recurrence of condition will improve Ability to verbalize feelings will improve Ability to disclose and discuss suicidal ideas Ability to demonstrate self-control will improve Ability to identify and develop effective coping behaviors will improve Ability to maintain clinical measurements within normal limits will improve Compliance with prescribed medications will improve Ability to identify changes in lifestyle to reduce recurrence of condition will improve Ability to verbalize feelings will improve Ability to disclose and discuss suicidal ideas Ability to demonstrate self-control will improve Ability to identify and develop effective coping behaviors will improve Ability to maintain clinical measurements within normal limits will improve     Medication Management: Evaluate patient's response, side effects, and tolerance of medication  regimen.  Therapeutic Interventions: 1 to 1 sessions, Unit Group sessions and Medication administration.  Evaluation of Outcomes: Progressing   RN Treatment Plan for Primary Diagnosis: <principal problem not specified> Long Term Goal(s): Knowledge of disease and therapeutic regimen to maintain health will improve  Short Term Goals: Ability to participate in decision making will improve, Ability to verbalize feelings will improve, Ability to disclose and discuss suicidal ideas, Ability to identify and develop effective coping behaviors will improve and Compliance with prescribed medications will improve  Medication Management: RN will administer medications as ordered by provider, will assess and evaluate patient's response and provide education to patient for prescribed medication. RN will report any adverse and/or side effects to prescribing provider.  Therapeutic Interventions: 1 on 1 counseling sessions, Psychoeducation, Medication administration, Evaluate responses to treatment, Monitor vital signs and CBGs as ordered, Perform/monitor CIWA, COWS, AIMS and Fall Risk screenings as ordered, Perform wound care treatments as ordered.  Evaluation of Outcomes: Progressing   [name] Treatment Plan for Primary Diagnosis: <principal problem not specified> Long Term Goal(s): Safe transition to appropriate next level of care at discharge, Engage patient in therapeutic group addressing interpersonal concerns.  Short Term Goals: Engage patient in aftercare planning with referrals and resources  Therapeutic Interventions: Assess for all discharge needs, 1 to 1 time with Social worker, Explore available resources and support systems, Assess for adequacy in community support network, Educate family and significant other(s) on suicide prevention,  Complete Psychosocial Assessment, Interpersonal group therapy.  Evaluation of Outcomes: Adequate for Discharge   Progress in Treatment: Attending groups:  Yes. Participating in groups: Yes. Taking medication as prescribed: Yes. Toleration medication: Yes. Family/Significant other contact made: No, will contact:  the patient's husband Patient understands diagnosis: Yes. Discussing patient identified problems/goals with staff: Yes. Medical problems stabilized or resolved: Yes. Denies suicidal/homicidal ideation: Yes. Issues/concerns per patient self-inventory: No. Other:   New problem(s) identified: None   New Short Term/Long Term Goal(s):  medication stabilization, elimination of SI thoughts, development of comprehensive mental wellness plan.    Patient Goals:  I need help, I'm scared of myself  Discharge Plan or Barriers: Patient plans to discharge home with her husband and family in [place], [place]. She plans to follow up with medication management and therapy services.   Reason for Continuation of Hospitalization: Anxiety Depression Medication stabilization Suicidal ideation  Estimated Length of Stay: 11/14/2018  Attendees: Patient: 11/10/2018 11:02 AM  Physician: Dr. Landry Mellow, MD 11/10/2018 11:02 AM  Nursing: Rayfield Citizen.Leonard Schwartz, RN 11/10/2018 11:02 AM  RN Care Manager: Onnie Boer, RN 11/10/2018 11:02 AM  Social Worker: Baldo Daub, LCSWA 11/10/2018 11:02 AM  Recreational Therapist:  11/10/2018 11:02 AM  Other: Marciano Sequin, NP 11/10/2018 11:02 AM  Other:  11/10/2018 11:02 AM  Other: 11/10/2018 11:02 AM    Scribe for Treatment Team: Maeola Sarah, LCSWA 11/10/2018 11:02 AM

## 2018-11-10 NOTE — Progress Notes (Signed)
D: Pt denies SI/HI/AVH. Pt is pleasant and cooperative. Pt stated Brittany [name] was doing better, ready for D/C A: Pt was offered support and encouragement. Pt was given scheduled medications. Pt was encourage to attend groups. Q 15 minute checks were done for safety.  R:Pt attends groups and interacts well with peers and staff. Pt is taking medication. Pt has no complaints.Pt receptive to treatment and safety maintained on unit.  Problem: Education: Goal: Emotional status will improve Outcome: Progressing   Problem: Education: Goal: Mental status will improve Outcome: Progressing   Problem: Activity: Goal: Interest or engagement in activities will improve Outcome: Progressing   Problem: Activity: Goal: Sleeping patterns will improve Outcome: Progressing

## 2018-11-10 NOTE — Progress Notes (Signed)
[facility] MD Progress Note  11/10/2018 11:48 AM Brittany [name]  MRN:  [id number] Subjective:  "I'm good."  Brittany [name] found resting in bed. Appears fatigued but euthymic. States "I'm trying to catch up on sleep" because she did not sleep well two nights ago. Reports improved sleep last night. Also reports improved mood today and denies depression. States she thinks medication changes have helped, as well as social interactions and group therapy. She states she is willing to consider outpatient therapy but rejects resources around her [facility], [place], [facility]), saying she has not liked these places in the past. She has refused referrals for psychiatric follow-up and prefers to continue seeing PCP. Denies AH. Denies SI.   Principal Problem: Bipolar 1 disorder, depressed, severe (HCC) Diagnosis: Principal Problem:   Bipolar 1 disorder, depressed, severe (HCC) Active Problems:   Borderline personality disorder (HCC)   MDD (major depressive disorder), recurrent, severe, with psychosis (HCC)  Total Time spent with patient: 15 minutes  Past Psychiatric History: See admission H&P  Past Medical History:  Past Medical History:  Diagnosis Date  . ADHD (attention deficit hyperactivity disorder)   . Anxiety   . Arthritis    right shoulder, bilateral knees  . Bipolar disorder (HCC)   . Bipolar I disorder, most recent episode depressed (HCC)   . Chronic kidney disease    Kidney stones  . Depression   . Headache     Past Surgical History:  Procedure Laterality Date  . CESAREAN SECTION    . CHOLECYSTECTOMY  2017  . FOOT SURGERY    . KIDNEY SURGERY    . NOVASURE ABLATION  2010  . SHOULDER SURGERY Right   . TUBAL LIGATION     Family History:  Family History  Problem Relation Age of Onset  . Anxiety disorder Mother   . Depression Mother   . Drug abuse Mother   . Bipolar disorder Mother   . Depression Father   . Anxiety disorder Father   . Drug abuse Father   . Anxiety  disorder Sister   . Depression Sister   . ADD / ADHD Sister   . Anxiety disorder Sister   . Depression Sister    Family Psychiatric  History: See admission H&P Social History:  Social History   Substance and Sexual Activity  Alcohol Use Not Currently  . Alcohol/week: 0.0 standard drinks   Comment: pt repeorts maybe drinks once a month     Social History   Substance and Sexual Activity  Drug Use Yes  . Types: Marijuana   Comment: pt reports THC oil, vapes daily; +Benzos    Social History   Socioeconomic History  . Marital status: Married    Spouse name: Not on file  . Number of children: Not on file  . Years of education: Not on file  . Highest education level: Not on file  Occupational History  . Not on file  Social Needs  . Financial resource strain: Not on file  . Food insecurity:    Worry: Not on file    Inability: Not on file  . Transportation needs:    Medical: Not on file    Non-medical: Not on file  Tobacco Use  . Smoking status: Current Every Day Smoker    Types: E-cigarettes, Cigarettes  . Smokeless tobacco: Never Used  Substance and Sexual Activity  . Alcohol use: Not Currently    Alcohol/week: 0.0 standard drinks    Comment: pt repeorts maybe drinks once a  month  . Drug use: Yes    Types: Marijuana    Comment: pt reports THC oil, vapes daily; +Benzos  . Sexual activity: Yes    Birth control/protection: None  Lifestyle  . Physical activity:    Days per week: Not on file    Minutes per session: Not on file  . Stress: Not on file  Relationships  . Social connections:    Talks on phone: Not on file    Gets together: Not on file    Attends religious service: Not on file    Active member of club or organization: Not on file    Attends meetings of clubs or organizations: Not on file    Relationship status: Not on file  Other Topics Concern  . Not on file  Social History Narrative  . Not on file   Additional Social History:    Pain  Medications: See MAR Prescriptions: See MAR Over the Counter: See MAR History of alcohol / drug use?: No history of alcohol / drug abuse Longest period of sobriety (when/how long): NA                    Sleep: Good  Appetite:  Fair  Current Medications: Current Facility-Administered Medications  Medication Dose Route Frequency Provider Last Rate Last Dose  . ALPRAZolam Prudy Feeler) tablet 2 mg  2 mg Oral TID Antonieta Pert, MD   2 mg at 11/10/18 0807  . desvenlafaxine (PRISTIQ) 24 hr tablet 100 mg  100 mg Oral Daily Antonieta Pert, MD   100 mg at 11/10/18 1610  . hydrOXYzine (ATARAX/VISTARIL) tablet 25 mg  25 mg Oral TID PRN Rankin, Shuvon B, NP   25 mg at 11/08/18 1806  . nicotine (NICODERM CQ - dosed in mg/24 hours) patch 21 mg  21 mg Transdermal Daily Rankin, Shuvon B, NP   21 mg at 11/10/18 0806  . traZODone (DESYREL) tablet 300 mg  300 mg Oral QHS Kerry Hough, PA-C   300 mg at 11/08/18 2116  . ziprasidone (GEODON) capsule 120 mg  120 mg Oral Q supper Antonieta Pert, MD   120 mg at 11/09/18 1658  . ziprasidone (GEODON) capsule 60 mg  60 mg Oral Daily Antonieta Pert, MD   60 mg at 11/10/18 9604    Lab Results:  Results for orders placed or performed during the hospital encounter of 11/08/18 (from the past 48 hour(s))  Pregnancy, urine     Status: None   Collection Time: 11/08/18  4:26 PM  Result Value Ref Range   Preg Test, Ur NEGATIVE NEGATIVE    Comment:        THE SENSITIVITY OF THIS METHODOLOGY IS >20 mIU/mL. Performed at [facility], [address], [place], [place] [address]   Urinalysis, Complete w Microscopic     Status: Abnormal   Collection Time: 11/08/18  4:26 PM  Result Value Ref Range   Color, Urine RED (A) YELLOW    Comment: BIOCHEMICALS MAY BE AFFECTED BY COLOR   APPearance TURBID (A) CLEAR   Specific Gravity, Urine 1.025 1.005 - 1.030   pH 5.0 5.0 - 8.0   Glucose, UA NEGATIVE NEGATIVE mg/dL   Hgb urine dipstick  NEGATIVE NEGATIVE   Bilirubin Urine NEGATIVE NEGATIVE   Ketones, ur NEGATIVE NEGATIVE mg/dL   Protein, ur NEGATIVE NEGATIVE mg/dL   Nitrite NEGATIVE NEGATIVE   Leukocytes, UA NEGATIVE NEGATIVE   RBC / HPF 0-5 0 -  5 RBC/hpf   WBC, UA 6-10 0 - 5 WBC/hpf   Bacteria, UA MANY (A) NONE SEEN   Squamous Epithelial / LPF 0-5 0 - 5   Mucus PRESENT     Comment: Performed at [facility], [address] [place], [place] [address]  Urine rapid drug screen (hosp performed)not at [facility]     Status: Abnormal   Collection Time: 11/08/18  4:26 PM  Result Value Ref Range   Opiates NONE DETECTED NONE DETECTED   Cocaine NONE DETECTED NONE DETECTED   Benzodiazepines POSITIVE (A) NONE DETECTED   Amphetamines NONE DETECTED NONE DETECTED   Tetrahydrocannabinol POSITIVE (A) NONE DETECTED   Barbiturates NONE DETECTED NONE DETECTED    Comment: (NOTE) DRUG SCREEN FOR MEDICAL PURPOSES ONLY.  IF CONFIRMATION IS NEEDED FOR ANY PURPOSE, NOTIFY LAB WITHIN 5 DAYS. LOWEST DETECTABLE LIMITS FOR URINE DRUG SCREEN Drug Class                     Cutoff (ng/mL) Amphetamine and metabolites    1000 Barbiturate and metabolites    200 Benzodiazepine                 200 Tricyclics and metabolites     300 Opiates and metabolites        300 Cocaine and metabolites        300 THC                            50 Performed at [facility], [address] [place], [place] [address]   CBC     Status: None   Collection Time: 11/08/18  6:47 PM  Result Value Ref Range   WBC 7.4 4.0 - 10.5 K/uL   RBC 4.38 3.87 - 5.11 MIL/uL   Hemoglobin 13.8 12.0 - 15.0 g/dL   HCT 09.843.3 11.936.0 - 14.746.0 %   MCV 98.9 80.0 - 100.0 fL   MCH 31.5 26.0 - 34.0 pg   MCHC 31.9 30.0 - 36.0 g/dL   RDW 82.913.1 56.211.5 - 13.015.5 %   Platelets 174 150 - 400 K/uL   nRBC 0.0 0.0 - 0.2 %    Comment: Performed at [facility], [address] [place], [place] [address]  Comprehensive metabolic panel     Status:  Abnormal   Collection Time: 11/08/18  6:47 PM  Result Value Ref Range   Sodium 136 135 - 145 mmol/L   Potassium 3.5 3.5 - 5.1 mmol/L   Chloride 103 98 - 111 mmol/L   CO2 23 22 - 32 mmol/L   Glucose, Bld 105 (H) 70 - 99 mg/dL   BUN 14 6 - 20 mg/dL   Creatinine, Ser 4.691.01 (H) 0.44 - 1.00 mg/dL   Calcium 9.5 8.9 - 62.910.3 mg/dL   Total Protein 7.2 6.5 - 8.1 g/dL   Albumin 4.3 3.5 - 5.0 g/dL   AST 18 15 - 41 U/L   ALT 19 0 - 44 U/L   Alkaline Phosphatase 59 38 - 126 U/L   Total Bilirubin 0.4 0.3 - 1.2 mg/dL   GFR calc non Af Amer >60 >60 mL/min   GFR calc Af Amer >60 >60 mL/min   Anion gap 10 5 - 15    Comment: Performed at [facility], [address] [place], [place] [address]  Ethanol     Status: None   Collection Time: 11/08/18  6:47 PM  Result Value Ref Range   Alcohol, Ethyl (B) <10 <10 mg/dL    Comment: (NOTE) Lowest detectable limit for serum alcohol is 10 mg/dL. For medical purposes only. Performed at [facility], [address] [place], [place] [address]   Hemoglobin A1c     Status: None   Collection Time: 11/08/18  6:47 PM  Result Value Ref Range   Hgb A1c MFr Bld 5.1 4.8 - 5.6 %    Comment: (NOTE) Pre diabetes:          5.7%-6.4% Diabetes:              >6.4% Glycemic control for   <7.0% adults with diabetes    Mean Plasma Glucose 99.67 mg/dL    Comment: Performed at [facility], [address], [place], [place] [address]  Lipid panel     Status: Abnormal   Collection Time: 11/08/18  6:47 PM  Result Value Ref Range   Cholesterol 239 (H) 0 - 200 mg/dL   Triglycerides 440 (H) <150 mg/dL   HDL 38 (L) >34 mg/dL   Total CHOL/HDL Ratio 6.3 RATIO   VLDL 63 (H) 0 - 40 mg/dL   LDL Cholesterol 742 (H) 0 - 99 mg/dL    Comment:        Total Cholesterol/HDL:CHD Risk Coronary Heart Disease Risk Table                     Men   Women  1/2 Average Risk   3.4   3.3  Average Risk       5.0   4.4  2 X Average Risk   9.6   7.1   3 X Average Risk  23.4   11.0        Use the calculated Patient Ratio above and the CHD Risk Table to determine the patient's CHD Risk.        ATP III CLASSIFICATION (LDL):  <100     mg/dL   Optimal  595-638  mg/dL   Near or Above                    Optimal  130-159  mg/dL   Borderline  756-433  mg/dL   High  >295     mg/dL   Very High Performed at [facility], [address] [place], [place] [address]   TSH     Status: None   Collection Time: 11/08/18  6:47 PM  Result Value Ref Range   TSH 4.447 0.350 - 4.500 uIU/mL    Comment: Performed by a 3rd Generation assay with a functional sensitivity of <=0.01 uIU/mL. Performed at [facility], [address], [place], [place] [address]     Blood Alcohol level:  Lab Results  Component Value Date   [facility] <10 11/08/2018   ETH <10 01/15/2018    Metabolic Disorder Labs: Lab Results  Component Value Date   HGBA1C 5.1 11/08/2018   MPG 99.67 11/08/2018   MPG 93.93 11/24/2017   Lab Results  Component Value Date   PROLACTIN 25.3 (H) 07/21/2017   PROLACTIN 57.7 (H) 06/10/2017   Lab Results  Component Value Date   CHOL 239 (H) 11/08/2018   TRIG 315 (H) 11/08/2018   HDL 38 (L) 11/08/2018   CHOLHDL 6.3 11/08/2018   VLDL 63 (H) 11/08/2018   LDLCALC 138 (H) 11/08/2018   LDLCALC 141 (H) 11/24/2017  Physical Findings: AIMS: Facial and Oral Movements Muscles of Facial Expression: None, normal Lips and Perioral Area: None, normal Jaw: None, normal Tongue: None, normal,Extremity Movements Upper (arms, wrists, hands, fingers): None, normal Lower (legs, knees, ankles, toes): None, normal, Trunk Movements Neck, shoulders, hips: None, normal, Overall Severity Severity of abnormal movements (highest score from questions above): None, normal Incapacitation due to abnormal movements: None, normal Patient's awareness of abnormal movements (rate only patient's report): No Awareness, Dental  Status Current problems with teeth and/or dentures?: No Does patient usually wear dentures?: No  CIWA:  CIWA-Ar Total: 1 COWS:  COWS Total Score: 3  Musculoskeletal: Strength & Muscle Tone: within normal limits Gait & Station: normal Patient leans: N/A  Psychiatric Specialty Exam: Physical Exam  Nursing note and vitals reviewed. Constitutional: She is oriented to person, place, and time. She appears well-developed and well-nourished.  Cardiovascular: Normal rate.  Respiratory: Effort normal.  Neurological: She is alert and oriented to person, place, and time.    Review of Systems  Constitutional: Negative.   Psychiatric/Behavioral: Positive for substance abuse (UDS +THC). Negative for depression, hallucinations, memory loss and suicidal ideas. The patient is not nervous/anxious and does not have insomnia.     Blood pressure (!) 130/92, pulse (!) 119, temperature 97.8 F (36.6 C), temperature source Oral, resp. rate 16, height 5' 5.5" (1.664 m), weight 110.7 kg.Body mass index is 39.99 kg/m.  General Appearance: Fairly Groomed  Eye Contact:  Fair  Speech:  Clear and Coherent  Volume:  Normal  Mood:  Euthymic  Affect:  Congruent  Thought Process:  Coherent  Orientation:  Full (Time, Place, and Person)  Thought Content:  WDL  Suicidal Thoughts:  No  Homicidal Thoughts:  No  Memory:  Immediate;   Good Recent;   Good  Judgement:  Fair  Insight:  Fair  Psychomotor Activity:  Normal  Concentration:  Concentration: Good  Recall:  Fair  Fund of Knowledge:  Fair  Language:  Good  Akathisia:  No  Handed:  Right  AIMS (if indicated):     Assets:  Communication Skills Desire for Improvement Housing Resilience  ADL's:  Intact  Cognition:  WNL  Sleep:  Number of Hours: 6.75     Treatment Plan Summary: Daily contact with patient to assess and evaluate symptoms and progress in treatment and Medication management   Continue inpatient hospitalization.  Continue Pristiq  100 mg PO daily for depression Continue Xanax 2 mg PO TID for anxiety Continue Geodon 60 mg PO QAM and 120 mg QPM for mood Continue trazodone 300 mg PO QHS for sleep  Patient will participate in the therapeutic group milieu.  Discharge disposition in progress.   Aldean BakerJanet E Abrina Petz, NP 11/10/2018, 11:48 AM

## 2018-11-11 MED ORDER — DESVENLAFAXINE SUCCINATE ER 100 MG PO TB24: 100.0000 mg | ORAL_TABLET | Freq: Every day | ORAL | 0 refills | Status: DC

## 2018-11-11 MED ORDER — NICOTINE 21 MG/24HR TD PT24: 21.0000 mg | MEDICATED_PATCH | Freq: Every day | TRANSDERMAL | 0 refills | Status: DC

## 2018-11-11 NOTE — BHH Suicide Risk Assessment (Signed)
[facility] Discharge Suicide Risk Assessment   Principal Problem: Bipolar 1 disorder, depressed, severe (HCC) Discharge Diagnoses: Principal Problem:   Bipolar 1 disorder, depressed, severe (HCC) Active Problems:   Borderline personality disorder (HCC)   MDD (major depressive disorder), recurrent, severe, with psychosis (HCC)   Total Time spent with patient: 15 minutes  Musculoskeletal: Strength & Muscle Tone: within normal limits Gait & Station: normal Patient leans: N/A  Psychiatric Specialty Exam: Review of Systems  All other systems reviewed and are negative.   Blood pressure 117/75, pulse 94, temperature 97.8 F (36.6 C), temperature source Oral, resp. rate 16, height 5' 5.5" (1.664 m), weight 110.7 kg.Body mass index is 39.99 kg/m.  General Appearance: Casual  Eye Contact::  Good  Speech:  Normal Rate409  Volume:  Normal  Mood:  Euthymic  Affect:  Congruent  Thought Process:  Coherent and Descriptions of Associations: Intact  Orientation:  Full (Time, Place, and Person)  Thought Content:  Logical  Suicidal Thoughts:  No  Homicidal Thoughts:  No  Memory:  Immediate;   Fair Recent;   Fair Remote;   Fair  Judgement:  Intact  Insight:  Fair  Psychomotor Activity:  Normal  Concentration:  Good  Recall:  [employer] of Knowledge:Fair  Language: Fair  Akathisia:  Negative  Handed:  Right  AIMS (if indicated):     Assets:  Communication Skills Desire for Improvement Financial Resources/Insurance Housing Leisure Time Physical Health Resilience  Sleep:  Number of Hours: 6.75  Cognition: WNL  ADL's:  Intact   Mental Status Per Nursing Assessment::   On Admission:  Suicidal ideation indicated by patient, Self-harm thoughts  Demographic Factors:  Caucasian, Low socioeconomic status and Unemployed  Loss Factors: NA  Historical Factors: Impulsivity  Risk Reduction Factors:   Sense of responsibility to family, Living with another person, especially a relative  and Positive social support  Continued Clinical Symptoms:  Bipolar Disorder:   Bipolar II Personality Disorders:   Cluster B  Cognitive Features That Contribute To Risk:  None    Suicide Risk:  Minimal: No identifiable suicidal ideation.  Patients presenting with no risk factors but with morbid ruminations; may be classified as minimal risk based on the severity of the depressive symptoms  Follow-up Information    [facility] Follow up on 11/15/2018.   Specialty:  Internal Medicine Why:  Medication management appointment with Dr. Ardelle Park is 2/5 at 8:40a. Please bring your current medications and discharge paperwork form this hospitalization.  Contact information: [address] [place] [address] [phone]           Plan Of Care/Follow-up recommendations:  Activity:  ad lib  Antonieta Pert, MD 11/11/2018, 7:43 AM

## 2018-11-11 NOTE — Plan of Care (Signed)
  Problem: Education: Goal: Knowledge of [facility] Education information/materials will improve Outcome: Completed/Met Goal: Emotional status will improve Outcome: Completed/Met Goal: Mental status will improve Outcome: Completed/Met Goal: Verbalization of understanding the information provided will improve Outcome: Completed/Met   Problem: Activity: Goal: Interest or engagement in activities will improve Outcome: Completed/Met Goal: Sleeping patterns will improve Outcome: Completed/Met   Problem: Coping: Goal: Ability to verbalize frustrations and anger appropriately will improve Outcome: Completed/Met Goal: Ability to demonstrate self-control will improve Outcome: Completed/Met   Problem: Health Behavior/Discharge Planning: Goal: Identification of resources available to assist in meeting health care needs will improve Outcome: Completed/Met Goal: Compliance with treatment plan for underlying cause of condition will improve Outcome: Completed/Met   Problem: Physical Regulation: Goal: Ability to maintain clinical measurements within normal limits will improve Outcome: Completed/Met   Problem: Safety: Goal: Periods of time without injury will increase Outcome: Completed/Met   Problem: Education: Goal: Ability to make informed decisions regarding treatment will improve Outcome: Completed/Met   Problem: Coping: Goal: Coping ability will improve Outcome: Completed/Met   Problem: Health Behavior/Discharge Planning: Goal: Identification of resources available to assist in meeting health care needs will improve Outcome: Completed/Met   Problem: Medication: Goal: Compliance with prescribed medication regimen will improve Outcome: Completed/Met   Problem: Self-Concept: Goal: Ability to disclose and discuss suicidal ideas will improve Outcome: Completed/Met Goal: Will verbalize positive feelings about self Outcome: Completed/Met   Problem: Education: Goal:  Utilization of techniques to improve thought processes will improve Outcome: Completed/Met Goal: Knowledge of the prescribed therapeutic regimen will improve Outcome: Completed/Met   Problem: Activity: Goal: Interest or engagement in leisure activities will improve Outcome: Completed/Met Goal: Imbalance in normal sleep/wake cycle will improve Outcome: Completed/Met   Problem: Coping: Goal: Coping ability will improve Outcome: Completed/Met Goal: Will verbalize feelings Outcome: Completed/Met   Problem: Health Behavior/Discharge Planning: Goal: Ability to make decisions will improve Outcome: Completed/Met Goal: Compliance with therapeutic regimen will improve Outcome: Completed/Met   Problem: Role Relationship: Goal: Will demonstrate positive changes in social behaviors and relationships Outcome: Completed/Met   Problem: Safety: Goal: Ability to disclose and discuss suicidal ideas will improve Outcome: Completed/Met Goal: Ability to identify and utilize support systems that promote safety will improve Outcome: Completed/Met   Problem: Self-Concept: Goal: Will verbalize positive feelings about self Outcome: Completed/Met Goal: Level of anxiety will decrease Outcome: Completed/Met
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
## 2018-11-11 NOTE — Progress Notes (Signed)
Nursing discharge note: Patient discharged home per MD order.  Patient received all personal belongings from unit and locker.  Reviewed AVS/transition record with patient and she indicates understanding.  Patient will follow up with her PCP, Dr. Ardelle ParkHaque. Her appointment is in place.  Patient denies any thoughts of self harm.  She left ambulatory; she has her own transportation in the parking lot. Patient had prescriptions upon discharge.

## 2018-11-11 NOTE — [facility] Suicide Risk Assessment (Signed)
[facility] INPATIENT:  Family/Significant Other Suicide Prevention Education  Suicide Prevention Education:  Education Completed; Husband Arthea Lain [phone],  (name of family member/significant other) has been identified by the patient as the family member/significant other with whom the patient will be residing, and identified as the person(s) who will aid the patient in the event of a mental health crisis (suicidal ideations/suicide attempt).  With written consent from the patient, the family member/significant other has been provided the following suicide prevention education, prior to the and/or following the discharge of the patient.  The suicide prevention education provided includes the following:  Suicide risk factors  Suicide prevention and interventions  National Suicide Hotline telephone number  [facility] assessment telephone number  [facility] Emergency Assistance 911  [facility] and/or Residential Mobile Crisis Unit telephone number  Request made of family/significant other to:  Remove weapons (e.g., guns, rifles, knives), all items previously/currently identified as safety concern.    Remove drugs/medications (over-the-counter, prescriptions, illicit drugs), all items previously/currently identified as a safety concern.  The family member/significant other verbalizes understanding of the suicide prevention education information provided.  The family member/significant other agrees to remove the items of safety concern listed above.  HUSBAND STATED HE FEELS GOOD ABOUT PT'S DISCHARGE TODAY, AS HE HAS BEEN VISITING AND SHE IS DOING WELL.  HE HAS BEEN HER FRIEND FOR 14 YEARS, AND THEY HAVE BEEN MARRIED 4 YEARS.  HE DID NOT FEEL A NEED TO REVIEW THE SUICIDE PREVENTION INFORMATION, AS HE IS "WELL AWARE" OF RISK FACTORS, WHAT TO WATCH FOR, AND WHAT TO DO.  Carloyn Jaeger Grossman-Orr 11/11/2018, 8:54 AM

## 2018-11-11 NOTE — Progress Notes (Signed)
  [facility] Adult [facility] Discharge Plan :  Will you be returning to the same living situation after discharge:  Yes,  with spouse At discharge, do you have transportation home?: Yes,  spouse Do you have the ability to pay for your medications: Yes,  no barriers  Release of information consent forms completed and turned in to Medical Records by CSW.   Patient to Follow up at: Follow-up Information    [facility] Follow up on 11/15/2018.   Specialty:  Internal Medicine Why:  Medication management appointment with Dr. Ardelle ParkHaque is 2/5 at 8:40a. Please bring your current medications and discharge paperwork form this hospitalization.  Contact information: [address] [place] [address] [phone]           Next level of care provider [facility] to [facility] Link:no  Safety Planning and Suicide Prevention discussed: Yes,  briefly with husband who stated they have been going through this for a long time and he already has the knowledge required  Have you used any form of tobacco in the last 30 days? (Cigarettes, Smokeless Tobacco, Cigars, and/or Pipes): Yes  Has patient been referred to the Quitline?: Patient refused referral  Patient has been referred for addiction treatment: N/A  Lynnell ChadMareida J Grossman-Orr, [name] 11/11/2018, 8:55 AM

## 2019-03-08 ENCOUNTER — Other Ambulatory Visit: Payer: Self-pay | Admitting: Surgery

## 2019-03-29 ENCOUNTER — Other Ambulatory Visit: Payer: Self-pay

## 2019-03-29 ENCOUNTER — Ambulatory Visit (HOSPITAL_COMMUNITY)
Admission: RE | Admit: 2019-03-29 | Discharge: 2019-03-29 | Disposition: A | Discharge status: Home / Self Care | Payer: Medicare Other | Source: Ambulatory Visit | Attending: Surgery | Admitting: Surgery

## 2019-03-29 ENCOUNTER — Other Ambulatory Visit: Payer: Self-pay | Admitting: Surgery

## 2019-03-29 NOTE — Progress Notes (Signed)
JESSICA ZANETTO PA AWARE OF 03-29-19 EKG RESULT, WAIT FOR FINAL RESULT AND IF UNCHANGED IS OK TO USE

## 2019-04-25 ENCOUNTER — Other Ambulatory Visit: Payer: Self-pay

## 2019-04-25 ENCOUNTER — Encounter: Payer: Self-pay | Admitting: Dietician

## 2019-04-25 ENCOUNTER — Encounter: Payer: Medicare Other | Attending: Surgery | Admitting: Dietician

## 2019-04-25 VITALS — Ht 64.0 in | Wt 245.5 lb

## 2019-04-25 DIAGNOSIS — Z6841 Body Mass Index (BMI) 40.0 and over, adult: Secondary | ICD-10-CM

## 2019-04-25 DIAGNOSIS — F319 Bipolar disorder, unspecified: Secondary | ICD-10-CM | POA: Diagnosis not present

## 2019-04-25 DIAGNOSIS — E785 Hyperlipidemia, unspecified: Secondary | ICD-10-CM | POA: Diagnosis not present

## 2019-04-25 NOTE — Patient Instructions (Signed)
   Reduce caffeine intake with less tea and coffee during the day, or 1/2 decaf, gradually switching to all decaf.   Increase water intake, replace some sweet tea with water.   Try Skinny Girl ranch dressing for less fat; try sugar free coffee flavorings along with lower fat creamer.   Plan to eat something 3 times a day.   Gradually reduce portions of starchy foods and have meals that are mostly lean protein and low-carb vegetables.

## 2019-04-25 NOTE — Progress Notes (Signed)
Nutrition Assessment  Proposed Surgery: Sleeve Gastrectomy  MD: Romana Juniper RD: Erlene Quan  Height: 5'4" Weight: 245.5lbs BMI: 42.14 Upper IBW% (UIBW): 186% (UIBW 132lbs)  Patient's Goal Weight: 140lbs  Medical History: anxiety, depression, ADD Medications and Supplements: alprazolam, amphetamine-dextroamphetamine, traZODone, ziprasidone  Previous surgeries: C-sect; uterine ablasion; galbladder removal Drug allergies: ceclor, adhesive tape Food allergies: wasabi (anaphylasis) Alcohol use: none  Tobacco use: e-cigarettes occasionally  Physical activity: no regular exercise; frequent recreational swimming  Weight history: Childhood: normal weight    Adolescence: began some extra weight gain    Adulthood: 230lbs at 86mo gestation at age 5, lost down to 160lbs, but regained gradually since then    Weight 1 year ago: about 245lbs  Dieting/ weight loss history: weight watchers with very little success; keto diet x 34mos and lost 45lbs but gradually drifted away from diet and regained. Also followed Atkins diet for some time. At age 2.during one summer, ate ice only, with one meal every 2-3 days when weak, lost 40lbs    Dietary Recall:  Daily pattern: 1-2 meals and 1 snacks. Dining out: 2-3 meals per week. Breakfast: usually none; sometimes bagel and fruit (patient states likely too much fruit) Lunch: chicken sandwich (Chick fil a) meal, occ cool wrap with fries and Ranch Supper: Husband cooks-- grilled or bbq chicken, occ pork chop or steak + sauteed zucchini +/or squash and onions Snack(s): evening snack cereal-- Honey Bunches of Oats Beverages: coffee with hazelnut flavored creamer and sweet tea; occasional Mt. Dew or Dr. Malachi Bonds; water during the night  Psychosocial: Emotional eating history: increased eating with stress  Disordered eating history: reports bulimia in high school, but has not purged since high school    Intervention:  Patient has researched this procedure by  consulting with family and friends and reading literature.   Instructed her on pre-op nutrition goals, including basics of pre-op diet.   Discussed stages of the bariatric diet after surgery as well as the importance of adequate protein and fluid intake.   Discussed emotional eating and importance of finding and implementing healthy, non-food strategies for stress management; advised discussing with psychologist at that appointment.   Summary:  Patient has begun making some small diet and lifestyle changes to prepare for bariatric surgery; she voices readiness for change.  She has solid support from husband, who is also going through bariatric surgery process.   She agrees to work on reducing carbohydrate and soda intake, increasing water, and eating at regular intervals prior to surgery.   She is motivated to follow the bariatric diet after surgery. From a nutrition standpoint, she is ready to proceed with the bariatric surgery program.    Plan:  Patient commits to returning for  Pre-op class prior to surgery.   She will plan to return for post-op RD visits beginning 2 weeks after surgery.

## 2019-08-16 ENCOUNTER — Ambulatory Visit: Payer: Medicare Other | Admitting: Psychology

## 2019-08-30 ENCOUNTER — Ambulatory Visit: Payer: Medicare Other | Admitting: Psychology

## 2019-10-02 ENCOUNTER — Ambulatory Visit: Payer: Medicare Other | Admitting: Psychology

## 2019-10-17 ENCOUNTER — Ambulatory Visit: Payer: Medicare Other | Admitting: Psychology

## 2019-11-01 ENCOUNTER — Ambulatory Visit: Payer: Medicare Other | Admitting: Psychology

## 2019-11-14 ENCOUNTER — Ambulatory Visit: Payer: Medicare Other | Admitting: Psychology

## 2019-11-15 ENCOUNTER — Ambulatory Visit: Payer: Medicare Other | Admitting: Psychology

## 2020-01-11 ENCOUNTER — Ambulatory Visit: Payer: Medicare Other | Admitting: Psychology

## 2020-11-25 DIAGNOSIS — M170 Bilateral primary osteoarthritis of knee: Secondary | ICD-10-CM | POA: Insufficient documentation

## 2021-05-17 IMAGING — RF UPPER GI SERIES (WITHOUT KUB)
12 series · 12 of 12 positions shown · non-contrast
Comparison: None.

CLINICAL DATA: Preop bariatric surgery.

EXAM:
UPPER GI SERIES WITH KUB
TECHNIQUE: After obtaining a scout radiograph a routine upper GI series was
performed using thin barium
FLUOROSCOPY TIME:  Fluoroscopy Time:  36 seconds
Radiation Exposure Index (if provided by the fluoroscopic device):
22.6 mGy
Number of Acquired Spot Images: 0

[Series 1: t abdomen supine · 0.15mm/px · 1 of 1 slices shown]
[im 1/1]
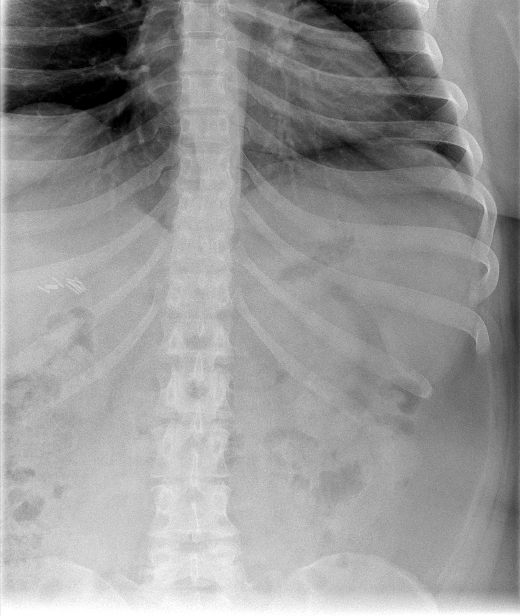

[Series 2: cp_standard · 0.27mm/px · 1 of 1 slices shown (1 of 11)]
[im 1/1]
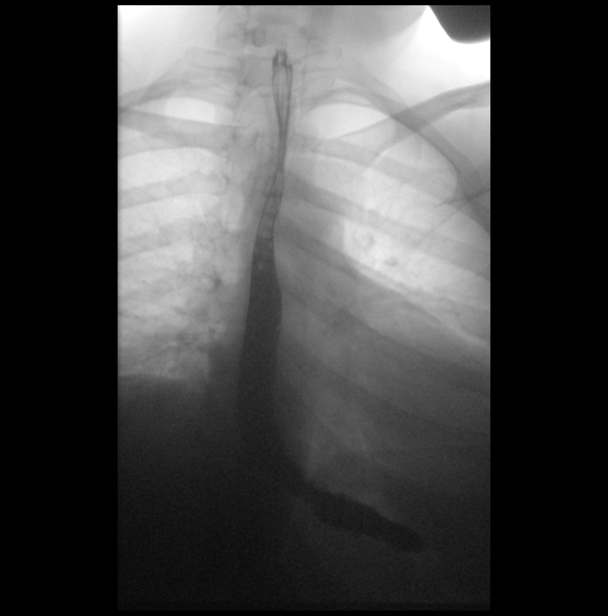

[Series 3: cp_standard · 0.27mm/px · 1 of 1 slices shown (2 of 11)]
[im 1/1]
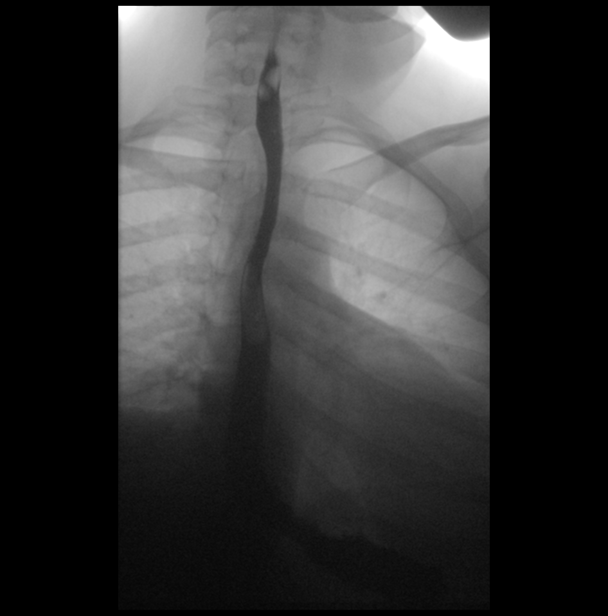

[Series 4: cp_standard · 0.27mm/px · 1 of 1 slices shown (3 of 11)]
[im 1/1]
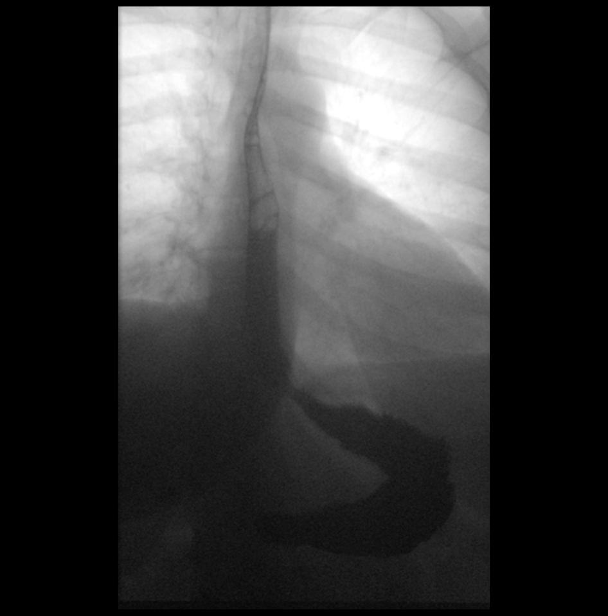

[Series 5: cp_standard · 0.27mm/px · 1 of 1 slices shown (4 of 11)]
[im 1/1]
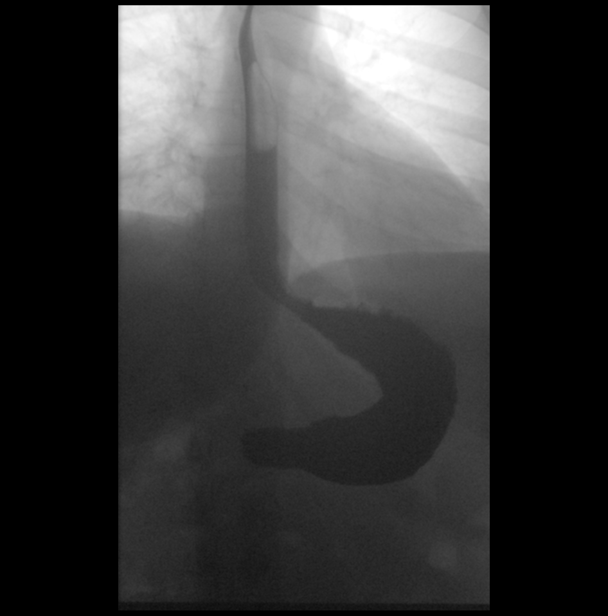

[Series 6: cp_standard · 0.27mm/px · 1 of 1 slices shown (5 of 11)]
[im 1/1]
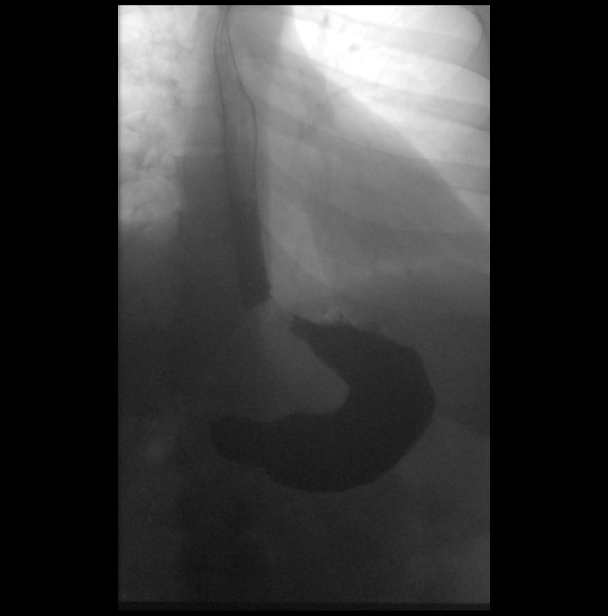

[Series 7: cp_standard · 0.27mm/px · 1 of 1 slices shown (6 of 11)]
[im 1/1]
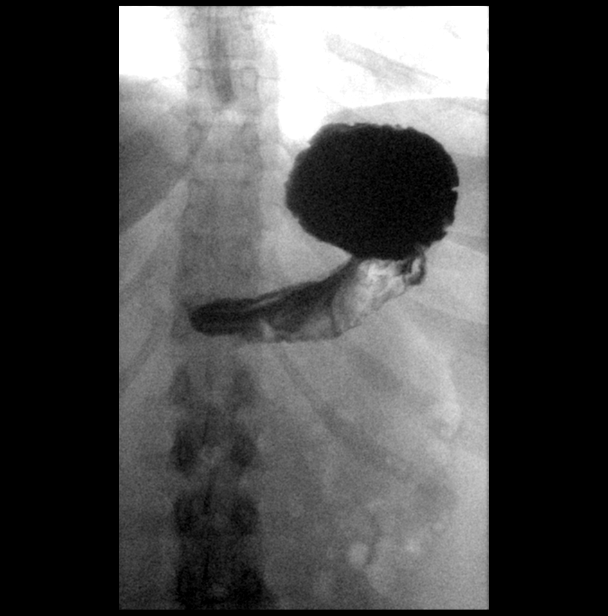

[Series 8: cp_standard · 0.27mm/px · 1 of 1 slices shown (7 of 11)]
[im 1/1]
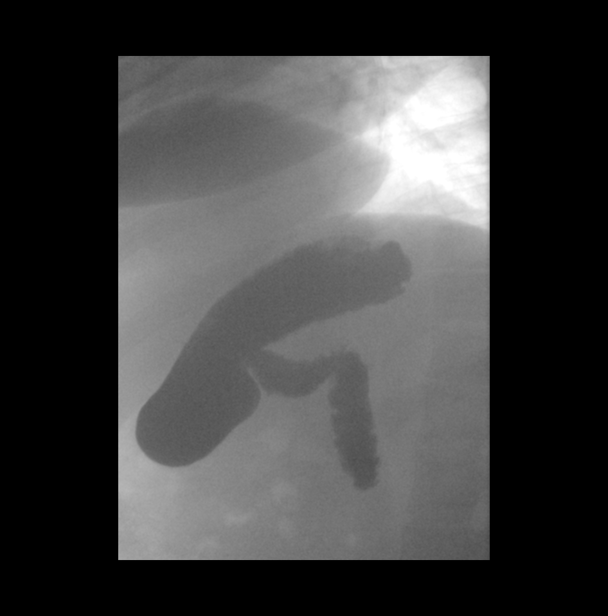

[Series 9: cp_standard · 0.18mm/px · 1 of 1 slices shown (8 of 11)]
[im 1/1]
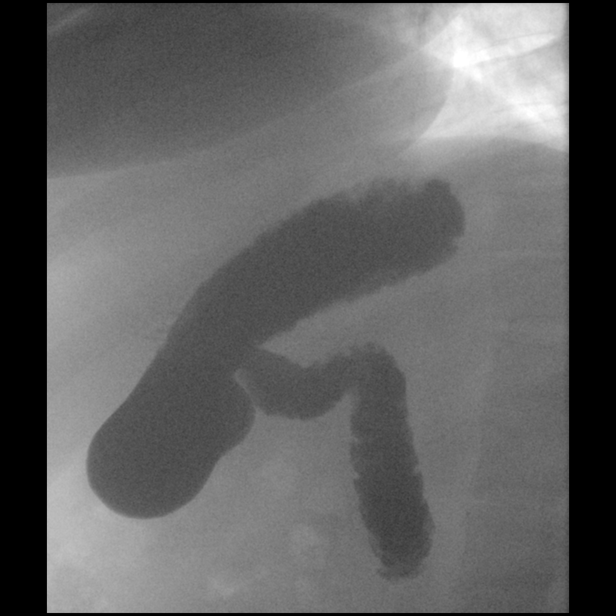

[Series 10: cp_standard · 0.18mm/px · 1 of 1 slices shown (9 of 11)]
[im 1/1]
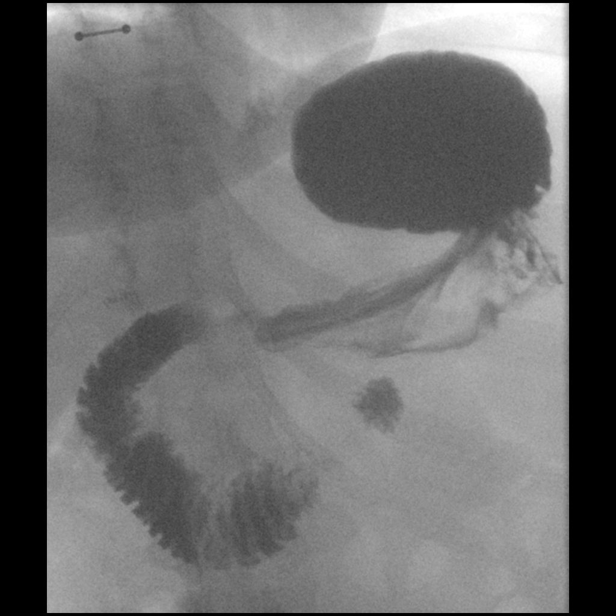

[Series 11: cp_standard · 0.18mm/px · 1 of 1 slices shown (10 of 11)]
[im 1/1]
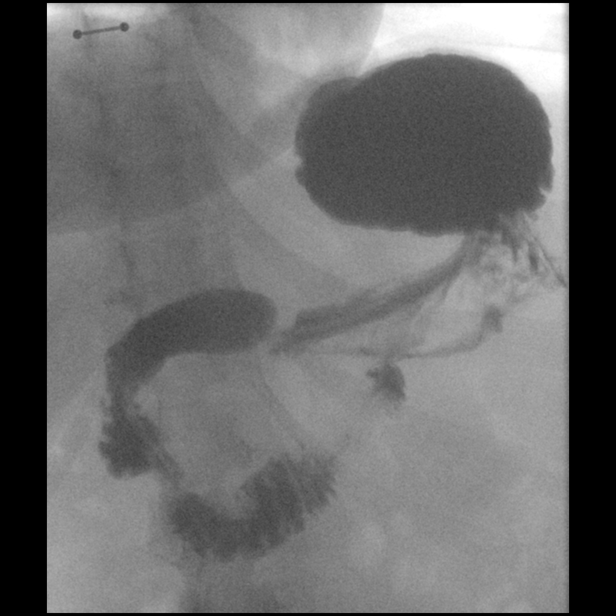

[Series 12: cp_standard · 0.18mm/px · 1 of 1 slices shown (11 of 11)]
[im 1/1]
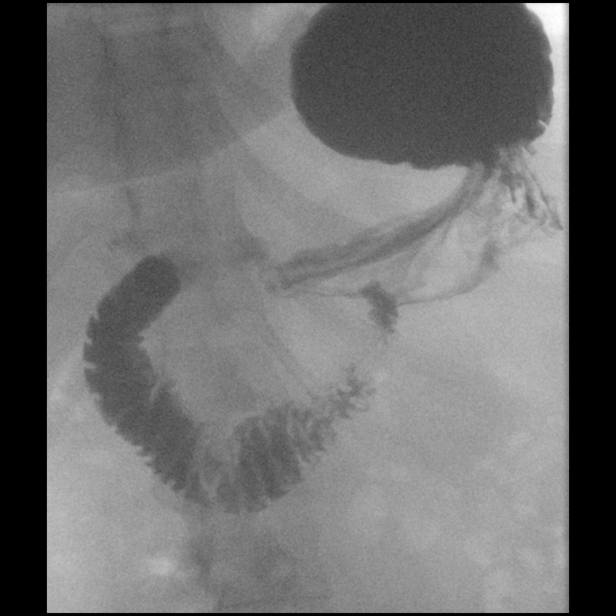

[12 of 12 positions shown; findings below may reference images not displayed]

FINDINGS: The scout radiograph shows cholecystectomy clips in the right upper
quadrant of the abdomen. No dilated loops of bowel identified. The
esophagus has a normal contour. No stricture or mass. Motility of
the esophagus appears normal. The stomach appears normal. The
duodenal bulb and C-loop are unremarkable. No hiatal hernia
identified. No reflux.
IMPRESSION: 1. Normal exam.

## 2021-11-01 ENCOUNTER — Emergency Department (HOSPITAL_COMMUNITY)
Admission: EM | Admit: 2021-11-01 | Discharge: 2021-11-03 | Disposition: A | Discharge status: Home / Self Care | Payer: Medicare Other | Source: Home / Self Care | Attending: Emergency Medicine | Admitting: Emergency Medicine

## 2021-11-01 ENCOUNTER — Other Ambulatory Visit: Payer: Self-pay

## 2021-11-01 ENCOUNTER — Ambulatory Visit (HOSPITAL_COMMUNITY)
Admission: RE | Admit: 2021-11-01 | Discharge: 2021-11-01 | Disposition: A | Discharge status: Home / Self Care | Payer: Medicare Other | Attending: Psychiatry | Admitting: Psychiatry

## 2021-11-01 ENCOUNTER — Encounter (HOSPITAL_COMMUNITY): Payer: Self-pay

## 2021-11-01 DIAGNOSIS — R630 Anorexia: Secondary | ICD-10-CM | POA: Insufficient documentation

## 2021-11-01 DIAGNOSIS — Z20822 Contact with and (suspected) exposure to covid-19: Secondary | ICD-10-CM | POA: Insufficient documentation

## 2021-11-01 DIAGNOSIS — R45851 Suicidal ideations: Secondary | ICD-10-CM | POA: Diagnosis not present

## 2021-11-01 DIAGNOSIS — F32A Depression, unspecified: Secondary | ICD-10-CM

## 2021-11-01 DIAGNOSIS — F3164 Bipolar disorder, current episode mixed, severe, with psychotic features: Secondary | ICD-10-CM | POA: Diagnosis not present

## 2021-11-01 LAB — RESP PANEL BY RT-PCR (FLU A&B, COVID) ARPGX2
Influenza A by PCR: NEGATIVE
Influenza B by PCR: NEGATIVE
SARS Coronavirus 2 by RT PCR: NEGATIVE

## 2021-11-01 MED ORDER — VILAZODONE HCL 20 MG PO TABS
40.0000 mg | ORAL_TABLET | Freq: Every day | ORAL | Status: DC
  Administered 2021-11-01 – 2021-11-02 (×2): 40 mg via ORAL
  Filled 2021-11-01 (×5): qty 2

## 2021-11-01 MED ORDER — LOPERAMIDE HCL 2 MG PO CAPS
2.0000 mg | ORAL_CAPSULE | Freq: Four times a day (QID) | ORAL | Status: DC | PRN
  Administered 2021-11-02 – 2021-11-03 (×2): 2 mg via ORAL
  Filled 2021-11-01 (×2): qty 1

## 2021-11-01 MED ORDER — ALPRAZOLAM 1 MG PO TABS
2.0000 mg | ORAL_TABLET | Freq: Three times a day (TID) | ORAL | Status: DC
  Administered 2021-11-01 – 2021-11-03 (×6): 2 mg via ORAL
  Filled 2021-11-01: qty 4
  Filled 2021-11-01: qty 2
  Filled 2021-11-01: qty 4
  Filled 2021-11-01 (×2): qty 2
  Filled 2021-11-01: qty 4

## 2021-11-01 MED ORDER — HALOPERIDOL LACTATE 5 MG/ML IJ SOLN
2.0000 mg | Freq: Once | INTRAMUSCULAR | Status: AC
  Administered 2021-11-01: 2 mg via INTRAMUSCULAR
  Filled 2021-11-01: qty 1

## 2021-11-01 MED ORDER — LISDEXAMFETAMINE DIMESYLATE 70 MG PO CAPS
70.0000 mg | ORAL_CAPSULE | Freq: Every morning | ORAL | Status: DC
  Administered 2021-11-02 – 2021-11-03 (×2): 70 mg via ORAL
  Filled 2021-11-01 (×2): qty 1

## 2021-11-01 NOTE — ED Triage Notes (Signed)
Pt arrived via POV, states she was sent to ED due to no availability of bed at [facility]. Pt endorses SI/HI with multiple plans for both. Cooperative in triage.
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
## 2021-11-01 NOTE — ED Provider Notes (Signed)
° °  [facility] EMERGENCY DEPARTMENT Provider Note    CSN: [id number] Arrival date & time: 11/01/21 1735  History No chief complaint on file.   Brittany [name] is a 36 y.o. female with history of bipolar disorder has been feeling depressed recently, having SI. Seen at [facility] and recommended for admission, however they were full and there are no beds available at [facility]. She was medically cleared at [facility], just sent to the ED for holding. She reports poor appetite and is worried she will have trouble sleeping while in the ED hallway bed. Otherwise no acute concerns.    Home Medications Prior to Admission medications   Medication Sig Start Date End Date Taking? Authorizing Provider  alprazolam Prudy Feeler) 2 MG tablet Take 2 mg by mouth 3 (three) times daily. 10/12/18   [provider]  amphetamine-dextroamphetamine (ADDERALL) 15 MG tablet Take 15 mg by mouth daily with lunch.    [provider]  amphetamine-dextroamphetamine (ADDERALL) 30 MG tablet Take 30 mg by mouth every morning.  10/12/18   [provider]  traZODone (DESYREL) 150 MG tablet  02/08/19   [provider]  ziprasidone (GEODON) 60 MG capsule Take 120 mg by mouth 2 (two) times daily with a meal.    [provider]     Allergies    Ceclor [cefaclor], Wasabi (wasabia [place]), and Adhesive [tape]   Review of Systems   Review of Systems Please see HPI for pertinent positives and negatives  Physical Exam BP 117/82 (BP Location: Left Arm)    Pulse 83    Temp 98.2 F (36.8 C) (Oral)    Resp 16    SpO2 100%   Physical Exam Vitals and nursing note reviewed.  HENT:     Head: Normocephalic.     Nose: Nose normal.  Eyes:     Extraocular Movements: Extraocular movements intact.  Pulmonary:     Effort: Pulmonary effort is normal.  Musculoskeletal:        General: Normal range of motion.     Cervical back: Neck supple.  Skin:    Findings: No rash (on exposed skin).  Neurological:      Mental Status: She is alert and oriented to person, place, and time.  Psychiatric:     Comments: Flat affect    ED Results / Procedures / Treatments   EKG None  Procedures Procedures  Medications Ordered in the ED Medications - No data to display  Initial Impression and Plan  Patient here with depression and SI, already evaluated by TTS and Psych, medically cleared at [facility], here for holding.   ED Course   Clinical Course as of 11/01/21 2015  Wynelle Link Nov 01, 2021  2014 Home meds ordered, patient is requesting a shot of haldol before she 'pops off'.  [CS]    Clinical Course User Index [CS] Pollyann Savoy, MD     MDM Rules/Calculators/A&P Medical Decision Making Problems Addressed: Depression, unspecified depression type: acute illness or injury that poses a threat to life or bodily functions Suicidal ideation: acute illness or injury that poses a threat to life or bodily functions  Risk Decision regarding hospitalization.    Final Clinical Impression(s) / ED Diagnoses Final diagnoses:  Depression, unspecified depression type  Suicidal ideation    Rx / DC Orders ED Discharge Orders     None        Pollyann Savoy, MD 11/01/21 1840

## 2021-11-01 NOTE — H&P (Signed)
Behavioral Health Medical Screening Exam  Brittany [name] is an 36 y.o. female with a past psychiatric history of BPD, ADHD, Bipolar 1 disorder, >10 inpatient hospitalizations, ECT tx, MDD recurrent severe with psychosis, MDD recurrent severe without psychosis who presented to [facility] voluntarily with her husband for evaluation of increased mood lability, suicidal and homicidal ideations.   Patient reports poor sleep despite being on multiple medications for sleep, anhedonia, increased feelings of guilt and worthlessness, low energy, poor concentration and appetite, intermittent psychomotor changes, impulsiveness, feelings of no control, and suicidal ideations with plan via her personal vehicle. Patient reports having gun to her head in past few days causing husband to remove firearms from home. Reports daily thoughts of suicidal ideations and recent feelings of wanting to kill her husband then herself; protective factor being son, however report increased intrusive thoughts over past few days where she no longer feels she is safe for herself or family. Endorses medication compliance; no changes in past 2 years. Medications currently managed by PCP. Stopped seeing therapist after mother began seeing her, felt "betrayed" since she feels mother is the "root of her mental health issues". No current outpatient mental health services in place.    Patient is unable to contract for safety. Collateral obtained from husband who agrees patient is not currently safe at this time; as protective measure firearms were removed from the home.   Total Time spent with patient: 20 minutes  Psychiatric Specialty Exam: Physical Exam Vitals and nursing note reviewed.  Constitutional:      General: She is not in acute distress.    Appearance: Normal appearance. She is not ill-appearing or toxic-appearing.  HENT:     Head: Normocephalic.     Nose: Nose normal.     Mouth/Throat:     Mouth: Mucous membranes are moist.      Pharynx: Oropharynx is clear.  Eyes:     Pupils: Pupils are equal, round, and reactive to light.  Cardiovascular:     Rate and Rhythm: Normal rate.     Pulses: Normal pulses.  Pulmonary:     Effort: Pulmonary effort is normal.  Musculoskeletal:        General: Normal range of motion.     Cervical back: Normal range of motion.  Skin:    General: Skin is warm and dry.  Neurological:     General: No focal deficit present.     Mental Status: She is alert and oriented to person, place, and time. Mental status is at baseline.  Psychiatric:        Attention and Perception: Perception normal.        Mood and Affect: Affect is labile.        Speech: Speech is tangential.        Behavior: Behavior is cooperative.        Thought Content: Thought content includes homicidal and suicidal ideation. Thought content includes suicidal plan.        Judgment: Judgment is impulsive and inappropriate.   Review of Systems  Constitutional:  Positive for activity change and appetite change.  Psychiatric/Behavioral:  Positive for agitation, decreased concentration, dysphoric mood, sleep disturbance and suicidal ideas.   Blood pressure 121/74, pulse 91, temperature 98.3 F (36.8 C), temperature source Oral, resp. rate 20, SpO2 99 %.There is no height or weight on file to calculate BMI. General Appearance: Fairly Groomed Eye Contact:  Good Speech:  Clear and Coherent Volume:  Normal Mood:  Dysphoric, Hopeless, and Worthless Affect:  Depressed and Tearful Thought Process:  Goal Directed Orientation:  Full (Time, Place, and Person) Thought Content:  Tangential Suicidal Thoughts:  Yes.  with intent/plan Homicidal Thoughts:  No Memory:  Immediate;   Fair Recent;   Fair Remote;   Fair Judgement:  Poor Insight:  Shallow Psychomotor Activity:  Normal Concentration: Concentration: Good and Attention Span: Good Recall:  Good Fund of Knowledge:Good Language: Good Akathisia:  NA Handed:  Right AIMS (if  indicated):    Assets:  Communication Skills Desire for Improvement Financial Resources/Insurance Housing Intimacy Leisure Time Physical Health Resilience Social Support Transportation Vocational/Educational Sleep:     Musculoskeletal: Strength & Muscle Tone: within normal limits Gait & Station: normal Patient leans: N/A  Blood pressure 121/74, pulse 91, temperature 98.3 F (36.8 C), temperature source Oral, resp. rate 20, SpO2 99 %.  Recommendations: Based on my evaluation the patient does not appear to have an emergency medical condition. [facility] at capacity; Patient is resident of [facility] and unable to contract for safety. Plan for patient to be transferred to [facility] for further observation, stabilization, and treatment.   Inda Merlin, NP 11/01/2021, 5:13 PM

## 2021-11-01 NOTE — ED Notes (Addendum)
Patient said every cop she sees in the hall way she "wants to shoot her fucking head." She is mad she is sitting in the hall way of the emergency room, she said she should have never checked herself in. She said she is mad they took her phone all her clothes and every thing. She said when she is suicidal this is not helpful to be in the hallway. "I have been quiet and calm since so far, but I am about to show them why I'm here, why I am about to trip out."

## 2021-11-02 MED ORDER — HALOPERIDOL 5 MG PO TABS
5.0000 mg | ORAL_TABLET | Freq: Two times a day (BID) | ORAL | Status: DC
  Administered 2021-11-02 – 2021-11-03 (×2): 5 mg via ORAL
  Filled 2021-11-02 (×2): qty 1

## 2021-11-02 MED ORDER — ZIPRASIDONE HCL 20 MG PO CAPS
160.0000 mg | ORAL_CAPSULE | Freq: Two times a day (BID) | ORAL | Status: DC
  Administered 2021-11-02: 160 mg via ORAL
  Filled 2021-11-02: qty 8

## 2021-11-02 NOTE — ED Notes (Signed)
Patient cooperative, calm at this moment, she said she woke up feeling refreshed.

## 2021-11-02 NOTE — ED Provider Notes (Signed)
Emergency Medicine Observation Re-evaluation Note  Brittany [name] is a 36 y.o. female, seen on rounds today.  Pt initially presented to the ED for complaints of Medical Clearance Currently, the patient is sitting in the hall bed watching the nurses.  Physical Exam  BP 116/80    Pulse 75    Temp 99 F (37.2 C) (Oral)    Resp 20    SpO2 99%  Physical Exam General: nad Cardiac: rrr Lungs: no resp distress Psych: si and depression  ED Course / MDM  EKG:   I have reviewed the labs performed to date as well as medications administered while in observation.  Recent changes in the last 24 hours include none.  Plan  Current plan is for meets inpt and still waiting for a bed.  Brittany [name] is not under involuntary commitment.     Gwyneth Sprout, MD 11/02/21 [phone]

## 2021-11-02 NOTE — ED Notes (Signed)
Patient to room 27. Patient alert and oriented. Patient oriented to unit and room . Patient cooperative.  Patient endorsed suicidal ideation.

## 2021-11-02 NOTE — ED Notes (Signed)
One bag of patient belongings placed in the locker designated for room 27.

## 2021-11-02 NOTE — ED Notes (Signed)
Breakfast tray given to pt 

## 2021-11-02 NOTE — ED Notes (Signed)
Pt given toothbrush, toothpaste, mouthwash, Deoderant, lotion, mesh panties, etc to help with ADLs. Pt assisted to restroom to complete ADLs.

## 2021-11-03 ENCOUNTER — Other Ambulatory Visit: Payer: Self-pay

## 2021-11-03 ENCOUNTER — Encounter (HOSPITAL_COMMUNITY): Payer: Self-pay | Admitting: Psychiatry

## 2021-11-03 ENCOUNTER — Inpatient Hospital Stay (HOSPITAL_COMMUNITY)
Admission: AD | Admit: 2021-11-03 | Discharge: 2021-11-07 | DRG: 885 | Disposition: A | Discharge status: Home / Self Care | Payer: Medicare Other | Source: Intra-hospital | Attending: Emergency Medicine | Admitting: Emergency Medicine

## 2021-11-03 DIAGNOSIS — Z9151 Personal history of suicidal behavior: Secondary | ICD-10-CM | POA: Diagnosis not present

## 2021-11-03 DIAGNOSIS — R45850 Homicidal ideations: Secondary | ICD-10-CM | POA: Diagnosis present

## 2021-11-03 DIAGNOSIS — G4700 Insomnia, unspecified: Secondary | ICD-10-CM | POA: Diagnosis present

## 2021-11-03 DIAGNOSIS — Z20822 Contact with and (suspected) exposure to covid-19: Secondary | ICD-10-CM | POA: Diagnosis present

## 2021-11-03 DIAGNOSIS — F603 Borderline personality disorder: Secondary | ICD-10-CM | POA: Diagnosis present

## 2021-11-03 DIAGNOSIS — R45851 Suicidal ideations: Secondary | ICD-10-CM | POA: Diagnosis present

## 2021-11-03 DIAGNOSIS — F1210 Cannabis abuse, uncomplicated: Secondary | ICD-10-CM | POA: Diagnosis present

## 2021-11-03 DIAGNOSIS — F3164 Bipolar disorder, current episode mixed, severe, with psychotic features: Secondary | ICD-10-CM | POA: Diagnosis present

## 2021-11-03 DIAGNOSIS — F17290 Nicotine dependence, other tobacco product, uncomplicated: Secondary | ICD-10-CM | POA: Diagnosis present

## 2021-11-03 DIAGNOSIS — Z818 Family history of other mental and behavioral disorders: Secondary | ICD-10-CM | POA: Diagnosis not present

## 2021-11-03 DIAGNOSIS — F4310 Post-traumatic stress disorder, unspecified: Secondary | ICD-10-CM | POA: Diagnosis present

## 2021-11-03 DIAGNOSIS — F3160 Bipolar disorder, current episode mixed, unspecified: Secondary | ICD-10-CM

## 2021-11-03 DIAGNOSIS — F1320 Sedative, hypnotic or anxiolytic dependence, uncomplicated: Secondary | ICD-10-CM | POA: Diagnosis present

## 2021-11-03 LAB — RAPID URINE DRUG SCREEN, HOSP PERFORMED
Amphetamines: POSITIVE — AB
Barbiturates: NOT DETECTED
Benzodiazepines: POSITIVE — AB
Cocaine: NOT DETECTED
Opiates: NOT DETECTED
Tetrahydrocannabinol: POSITIVE — AB

## 2021-11-03 LAB — CBC
HCT: 38.3 % (ref 36.0–46.0)
Hemoglobin: 13.3 g/dL (ref 12.0–15.0)
MCH: 31.3 pg (ref 26.0–34.0)
MCHC: 34.7 g/dL (ref 30.0–36.0)
MCV: 90.1 fL (ref 80.0–100.0)
Platelets: 164 10*3/uL (ref 150–400)
RBC: 4.25 MIL/uL (ref 3.87–5.11)
RDW: 12.6 % (ref 11.5–15.5)
WBC: 7.8 10*3/uL (ref 4.0–10.5)
nRBC: 0 % (ref 0.0–0.2)

## 2021-11-03 LAB — COMPREHENSIVE METABOLIC PANEL
ALT: 22 U/L (ref 0–44)
AST: 23 U/L (ref 15–41)
Albumin: 4 g/dL (ref 3.5–5.0)
Alkaline Phosphatase: 55 U/L (ref 38–126)
Anion gap: 8 (ref 5–15)
BUN: 14 mg/dL (ref 6–20)
CO2: 25 mmol/L (ref 22–32)
Calcium: 9 mg/dL (ref 8.9–10.3)
Chloride: 105 mmol/L (ref 98–111)
Creatinine, Ser: 0.68 mg/dL (ref 0.44–1.00)
GFR, Estimated: >60 mL/min (ref >60)
Glucose, Bld: 103 mg/dL — ABNORMAL HIGH (ref 70–99)
Potassium: 4 mmol/L (ref 3.5–5.1)
Sodium: 138 mmol/L (ref 135–145)
Total Bilirubin: 0.5 mg/dL (ref 0.3–1.2)
Total Protein: 7.1 g/dL (ref 6.5–8.1)

## 2021-11-03 LAB — RESP PANEL BY RT-PCR (FLU A&B, COVID) ARPGX2
Influenza A by PCR: NEGATIVE
Influenza B by PCR: NEGATIVE
SARS Coronavirus 2 by RT PCR: NEGATIVE

## 2021-11-03 LAB — LIPID PANEL
Cholesterol: 203 mg/dL — ABNORMAL HIGH (ref 0–200)
HDL: 37 mg/dL — ABNORMAL LOW (ref >40)
LDL Cholesterol: 121 mg/dL — ABNORMAL HIGH (ref 0–99)
Total CHOL/HDL Ratio: 5.5 RATIO
Triglycerides: 227 mg/dL — ABNORMAL HIGH (ref <150)
VLDL: 45 mg/dL — ABNORMAL HIGH (ref 0–40)

## 2021-11-03 LAB — TSH: TSH: 1.993 u[IU]/mL (ref 0.350–4.500)

## 2021-11-03 LAB — HCG, QUANTITATIVE, PREGNANCY: hCG, Beta Chain, Quant, S: <1 m[IU]/mL (ref <5)

## 2021-11-03 MED ORDER — LORAZEPAM 1 MG PO TABS: 1.0000 mg | ORAL_TABLET | ORAL | Status: DC | PRN

## 2021-11-03 MED ORDER — ALUM & MAG HYDROXIDE-SIMETH 200-200-20 MG/5ML PO SUSP: 30.0000 mL | ORAL | Status: DC | PRN

## 2021-11-03 MED ORDER — VILAZODONE HCL 20 MG PO TABS
40.0000 mg | ORAL_TABLET | Freq: Every day | ORAL | Status: DC
  Administered 2021-11-03 – 2021-11-06 (×4): 40 mg via ORAL
  Filled 2021-11-03 (×8): qty 2

## 2021-11-03 MED ORDER — ZIPRASIDONE MESYLATE 20 MG IM SOLR: 20.0000 mg | INTRAMUSCULAR | Status: DC | PRN

## 2021-11-03 MED ORDER — ALPRAZOLAM 0.5 MG PO TABS
2.0000 mg | ORAL_TABLET | Freq: Three times a day (TID) | ORAL | Status: DC
  Administered 2021-11-03 – 2021-11-04 (×3): 2 mg via ORAL
  Filled 2021-11-03 (×3): qty 4

## 2021-11-03 MED ORDER — TRAZODONE HCL 50 MG PO TABS
50.0000 mg | ORAL_TABLET | Freq: Every day | ORAL | Status: DC
  Administered 2021-11-03: 21:00:00 50 mg via ORAL
  Filled 2021-11-03 (×4): qty 1

## 2021-11-03 MED ORDER — LISDEXAMFETAMINE DIMESYLATE 30 MG PO CAPS
70.0000 mg | ORAL_CAPSULE | Freq: Every morning | ORAL | Status: DC
  Administered 2021-11-04: 12:00:00 70 mg via ORAL
  Filled 2021-11-03: qty 2

## 2021-11-03 MED ORDER — OLANZAPINE 5 MG PO TBDP: 5.0000 mg | ORAL_TABLET | Freq: Three times a day (TID) | ORAL | Status: DC | PRN

## 2021-11-03 MED ORDER — HALOPERIDOL 5 MG PO TABS
5.0000 mg | ORAL_TABLET | Freq: Two times a day (BID) | ORAL | Status: DC
  Administered 2021-11-03 – 2021-11-07 (×8): 5 mg via ORAL
  Filled 2021-11-03 (×14): qty 1

## 2021-11-03 MED ORDER — MAGNESIUM HYDROXIDE 400 MG/5ML PO SUSP: 30.0000 mL | Freq: Every day | ORAL | Status: DC | PRN

## 2021-11-03 MED ORDER — TRAZODONE HCL 50 MG PO TABS
50.0000 mg | ORAL_TABLET | Freq: Every day | ORAL | Status: DC
  Administered 2021-11-03: 01:00:00 50 mg via ORAL
  Filled 2021-11-03: qty 1

## 2021-11-03 MED ORDER — NICOTINE 14 MG/24HR TD PT24
14.0000 mg | MEDICATED_PATCH | Freq: Every day | TRANSDERMAL | Status: DC
  Administered 2021-11-03 – 2021-11-04 (×2): 14 mg via TRANSDERMAL
  Filled 2021-11-03 (×6): qty 1

## 2021-11-03 MED ORDER — ACETAMINOPHEN 325 MG PO TABS: 650.0000 mg | ORAL_TABLET | Freq: Four times a day (QID) | ORAL | Status: DC | PRN

## 2021-11-03 MED ORDER — LOPERAMIDE HCL 2 MG PO CAPS
2.0000 mg | ORAL_CAPSULE | Freq: Four times a day (QID) | ORAL | Status: DC | PRN
  Administered 2021-11-03: 19:00:00 2 mg via ORAL
  Filled 2021-11-03: qty 1

## 2021-11-03 NOTE — Progress Notes (Signed)
The patient rated her day as a 9 out of 10 since she has already spent two days in the emergency room. She is concerned about having difficulty sleeping.

## 2021-11-03 NOTE — Progress Notes (Signed)
Patient ID: Brittany [name], female   DOB: 1986/05/08, 36 y.o.   MRN: [id number] Admission Note  Pt is a 36 yo female that presents voluntarily on 11/03/2021 with worsening si, hi towards husband, anxiety, worrying, and their "medications not working anymore". Pt states they had multiple plans of how to kill themselves. Pt states they had thoughts of stabbing their husband. Pt states no stressors were present and the husband is a great provider. Pt does express concerns with sexual activity with them, and told the husband that they would harm them if they ever cheated on them. Pt verbalizes understanding that the medications the husband takes impacts their libido but still feels frustrated and can't help but to think negative thoughts. Pt states they have a pcp and is compliant with their medications. Pt endorses cannabis use but denies alcohol/Rx use/abuse. Pt denies current si/hi/ah/vh and verbally agrees to approach staff before harming self/others while at [facility]. Consents signed, handbook detailing the patient's rights, responsibilities, and visitor guidelines provided. Skin/belongings search completed and patient oriented to unit. Patient stable at this time. Patient given the opportunity to express concerns and ask questions. Patient given toiletries. Will continue to monitor.

## 2021-11-03 NOTE — Tx Team (Signed)
Initial Treatment Plan 11/03/2021 5:41 PM Brittany [name] [id number]    PATIENT STRESSORS: Financial difficulties   Health problems   Marital or family conflict   Substance abuse     PATIENT STRENGTHS: Ability for insight  Average or above average intelligence  Communication skills  Motivation for treatment/growth  Physical Health  Supportive family/friends  Work skills    PATIENT IDENTIFIED PROBLEMS: SI  HI  anxiety  "Medications not working"               DISCHARGE CRITERIA:  Ability to meet basic life and health needs Improved stabilization in mood, thinking, and/or behavior Medical problems require only outpatient monitoring Need for constant or close observation no longer present  PRELIMINARY DISCHARGE PLAN: Attend aftercare/continuing care group Participate in family therapy Return to previous living arrangement  PATIENT/FAMILY INVOLVEMENT: This treatment plan has been presented to and reviewed with the patient, Brittany [name].  The patient and family have been given the opportunity to ask questions and make suggestions.  [name], RN 11/03/2021, 5:41 PM

## 2021-11-03 NOTE — ED Notes (Signed)
Husband came to visit. Was here for approx. 30 min to bring clothing in suitcase at nurses station and 1 bag of clothing. Resting quietly in bed.

## 2021-11-03 NOTE — ED Notes (Signed)
Message left with safe transport

## 2021-11-03 NOTE — ED Notes (Signed)
Patient requested trazodone for sleep. Notified Dr. Christy Gentles. New order for trazodone ordered and given. Will continue to monitor.

## 2021-11-03 NOTE — Progress Notes (Signed)
Pt was concerned that they stopped her Geodon and started her on Haldol. Pt was educated on the medications and informed to discuss her concerns with the doctor and let the doctor know past issues and trials.

## 2021-11-03 NOTE — ED Notes (Signed)
Report given to Michael RN.

## 2021-11-03 NOTE — ED Notes (Signed)
Patient calm and cooperative at this time. Resting. Questioning plan for today. Informed of possible placement.

## 2021-11-03 NOTE — ED Notes (Signed)
[facility] called 

## 2021-11-03 NOTE — Progress Notes (Signed)
Pt stated she was having issues with the husband , by not getting enough physical contact between them. Writer informed pt communication between pt and husband needs to increase and things they need to work on and figure out about husbands happiness to help and increase pt happiness    11/03/21 2200  Psych Admission Type (Psych Patients Only)  Admission Status Voluntary  Psychosocial Assessment  Patient Complaints Anxiety;Worrying  Eye Contact Fair  Facial Expression Animated;Anxious;Worried  Affect Anxious;Labile  Speech Logical/coherent;Pressured  Interaction Assertive  Motor Activity Restless  Appearance/Hygiene Unremarkable  Behavior Characteristics Cooperative;Restless;Hypersexual  Mood Anxious  Thought Process  Coherency Concrete thinking  Content Blaming others;Blaming self;Paranoia  Delusions Jealousy;Paranoid  Perception WDL  Hallucination None reported or observed  Judgment Poor  Confusion None  Danger to Self  Current suicidal ideation? Denies  Danger to Others  Danger to Others Reported or observed  Danger to Others Abnormal  Harmful Behavior to others Threats of violence towards other people observed or expressed

## 2021-11-03 NOTE — BH Assessment (Signed)
[facility] Assessment Progress Note   Per Maxie Barb, NP, this pt requires psychiatric hospitalization at this time.  Linsey, RN, [facility] has tentatively assigned pt to [facility] Rm 406-2 to the service of Dr Loleta Chance, pending results of labs that were just ordered.  Pt has signed Voluntary Admission and Consent for Treatment, as well as Consent to Release Information to her PCP and to her husband, and signed forms have been faxed to [facility].  EDP Bethann Berkshire, MD and pt's nurse, Gabriel Earing, have been notified, and [facility] agrees to send original paperwork along with pt via [facility], and to call report to [phone].  Doylene Canning, [place] [facility] Coordinator [phone]

## 2021-11-04 ENCOUNTER — Encounter (HOSPITAL_COMMUNITY): Payer: Self-pay | Admitting: Family

## 2021-11-04 ENCOUNTER — Encounter (HOSPITAL_COMMUNITY): Payer: Self-pay

## 2021-11-04 DIAGNOSIS — M179 Osteoarthritis of knee, unspecified: Secondary | ICD-10-CM | POA: Insufficient documentation

## 2021-11-04 DIAGNOSIS — N920 Excessive and frequent menstruation with regular cycle: Secondary | ICD-10-CM | POA: Insufficient documentation

## 2021-11-04 DIAGNOSIS — Z9889 Other specified postprocedural states: Secondary | ICD-10-CM | POA: Insufficient documentation

## 2021-11-04 DIAGNOSIS — E039 Hypothyroidism, unspecified: Secondary | ICD-10-CM | POA: Insufficient documentation

## 2021-11-04 DIAGNOSIS — R112 Nausea with vomiting, unspecified: Secondary | ICD-10-CM | POA: Insufficient documentation

## 2021-11-04 DIAGNOSIS — R41840 Attention and concentration deficit: Secondary | ICD-10-CM | POA: Insufficient documentation

## 2021-11-04 DIAGNOSIS — G932 Benign intracranial hypertension: Secondary | ICD-10-CM | POA: Insufficient documentation

## 2021-11-04 DIAGNOSIS — N3281 Overactive bladder: Secondary | ICD-10-CM | POA: Insufficient documentation

## 2021-11-04 DIAGNOSIS — E119 Type 2 diabetes mellitus without complications: Secondary | ICD-10-CM | POA: Insufficient documentation

## 2021-11-04 DIAGNOSIS — M150 Primary generalized (osteo)arthritis: Secondary | ICD-10-CM | POA: Insufficient documentation

## 2021-11-04 DIAGNOSIS — R1013 Epigastric pain: Secondary | ICD-10-CM | POA: Insufficient documentation

## 2021-11-04 DIAGNOSIS — E559 Vitamin D deficiency, unspecified: Secondary | ICD-10-CM | POA: Insufficient documentation

## 2021-11-04 DIAGNOSIS — F3160 Bipolar disorder, current episode mixed, unspecified: Secondary | ICD-10-CM

## 2021-11-04 DIAGNOSIS — K811 Chronic cholecystitis: Secondary | ICD-10-CM | POA: Insufficient documentation

## 2021-11-04 DIAGNOSIS — R509 Fever, unspecified: Secondary | ICD-10-CM | POA: Insufficient documentation

## 2021-11-04 DIAGNOSIS — R3981 Functional urinary incontinence: Secondary | ICD-10-CM | POA: Insufficient documentation

## 2021-11-04 DIAGNOSIS — Z6835 Body mass index (BMI) 35.0-35.9, adult: Secondary | ICD-10-CM | POA: Insufficient documentation

## 2021-11-04 DIAGNOSIS — G43709 Chronic migraine without aura, not intractable, without status migrainosus: Secondary | ICD-10-CM | POA: Insufficient documentation

## 2021-11-04 DIAGNOSIS — I10 Essential (primary) hypertension: Secondary | ICD-10-CM | POA: Insufficient documentation

## 2021-11-04 DIAGNOSIS — R110 Nausea: Secondary | ICD-10-CM | POA: Insufficient documentation

## 2021-11-04 DIAGNOSIS — E221 Hyperprolactinemia: Secondary | ICD-10-CM | POA: Insufficient documentation

## 2021-11-04 DIAGNOSIS — J018 Other acute sinusitis: Secondary | ICD-10-CM | POA: Insufficient documentation

## 2021-11-04 DIAGNOSIS — E782 Mixed hyperlipidemia: Secondary | ICD-10-CM | POA: Insufficient documentation

## 2021-11-04 DIAGNOSIS — F3132 Bipolar disorder, current episode depressed, moderate: Secondary | ICD-10-CM | POA: Insufficient documentation

## 2021-11-04 DIAGNOSIS — N926 Irregular menstruation, unspecified: Secondary | ICD-10-CM | POA: Insufficient documentation

## 2021-11-04 DIAGNOSIS — F329 Major depressive disorder, single episode, unspecified: Secondary | ICD-10-CM | POA: Insufficient documentation

## 2021-11-04 DIAGNOSIS — J309 Allergic rhinitis, unspecified: Secondary | ICD-10-CM | POA: Insufficient documentation

## 2021-11-04 DIAGNOSIS — Z79899 Other long term (current) drug therapy: Secondary | ICD-10-CM | POA: Insufficient documentation

## 2021-11-04 DIAGNOSIS — M545 Low back pain, unspecified: Secondary | ICD-10-CM | POA: Insufficient documentation

## 2021-11-04 DIAGNOSIS — F1210 Cannabis abuse, uncomplicated: Secondary | ICD-10-CM | POA: Diagnosis present

## 2021-11-04 DIAGNOSIS — D518 Other vitamin B12 deficiency anemias: Secondary | ICD-10-CM | POA: Insufficient documentation

## 2021-11-04 DIAGNOSIS — F3010 Manic episode without psychotic symptoms, unspecified: Secondary | ICD-10-CM | POA: Insufficient documentation

## 2021-11-04 DIAGNOSIS — K219 Gastro-esophageal reflux disease without esophagitis: Secondary | ICD-10-CM | POA: Insufficient documentation

## 2021-11-04 DIAGNOSIS — F40240 Claustrophobia: Secondary | ICD-10-CM | POA: Insufficient documentation

## 2021-11-04 DIAGNOSIS — IMO0002 Reserved for concepts with insufficient information to code with codable children: Secondary | ICD-10-CM | POA: Insufficient documentation

## 2021-11-04 DIAGNOSIS — F410 Panic disorder [episodic paroxysmal anxiety] without agoraphobia: Secondary | ICD-10-CM | POA: Insufficient documentation

## 2021-11-04 DIAGNOSIS — K580 Irritable bowel syndrome with diarrhea: Secondary | ICD-10-CM | POA: Insufficient documentation

## 2021-11-04 DIAGNOSIS — M1710 Unilateral primary osteoarthritis, unspecified knee: Secondary | ICD-10-CM | POA: Insufficient documentation

## 2021-11-04 DIAGNOSIS — M172 Bilateral post-traumatic osteoarthritis of knee: Secondary | ICD-10-CM | POA: Insufficient documentation

## 2021-11-04 DIAGNOSIS — J029 Acute pharyngitis, unspecified: Secondary | ICD-10-CM | POA: Insufficient documentation

## 2021-11-04 HISTORY — DX: Gastro-esophageal reflux disease without esophagitis: K21.9

## 2021-11-04 MED ORDER — BENZTROPINE MESYLATE 1 MG/ML IJ SOLN: 1.0000 mg | Freq: Four times a day (QID) | INTRAMUSCULAR | Status: DC | PRN

## 2021-11-04 MED ORDER — HALOPERIDOL 5 MG PO TABS: 5.0000 mg | ORAL_TABLET | Freq: Four times a day (QID) | ORAL | Status: DC | PRN

## 2021-11-04 MED ORDER — ALPRAZOLAM 0.5 MG PO TABS
1.5000 mg | ORAL_TABLET | Freq: Three times a day (TID) | ORAL | Status: DC
  Administered 2021-11-04 – 2021-11-07 (×8): 1.5 mg via ORAL
  Filled 2021-11-04 (×8): qty 3

## 2021-11-04 MED ORDER — BENZTROPINE MESYLATE 1 MG PO TABS: 1.0000 mg | ORAL_TABLET | Freq: Four times a day (QID) | ORAL | Status: DC | PRN

## 2021-11-04 MED ORDER — TRAZODONE HCL 100 MG PO TABS
100.0000 mg | ORAL_TABLET | Freq: Every day | ORAL | Status: DC
  Administered 2021-11-04 – 2021-11-06 (×3): 100 mg via ORAL
  Filled 2021-11-04 (×6): qty 1

## 2021-11-04 MED ORDER — HALOPERIDOL LACTATE 5 MG/ML IJ SOLN: 5.0000 mg | Freq: Four times a day (QID) | INTRAMUSCULAR | Status: DC | PRN

## 2021-11-04 MED ORDER — LORAZEPAM 1 MG PO TABS: 1.0000 mg | ORAL_TABLET | Freq: Three times a day (TID) | ORAL | Status: DC | PRN

## 2021-11-04 MED ORDER — TOPIRAMATE 25 MG PO TABS
25.0000 mg | ORAL_TABLET | Freq: Two times a day (BID) | ORAL | Status: DC
  Administered 2021-11-04 – 2021-11-07 (×6): 25 mg via ORAL
  Filled 2021-11-04 (×10): qty 1

## 2021-11-04 NOTE — H&P (Signed)
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 plan; Medication compliance and effectiveness Discharge Goals: Return home with outpatient referrals for mental health follow-up including medication management/psychotherapy  I certify that inpatient services furnished can reasonably be expected to improve the patient's condition.    Nicholes Rough, NP 1/25/20234:00 PM

## 2021-11-04 NOTE — Progress Notes (Signed)
Pt provided urine specimen cup.

## 2021-11-04 NOTE — Progress Notes (Signed)
°   11/04/21 0600  Sleep  Number of Hours 7.5

## 2021-11-04 NOTE — Group Note (Signed)
[name] Group Therapy Note   Group Date: 11/04/2021 Start Time: 1300 End Time: 1400  Type of Therapy and Topic:  Group Therapy - Healthy vs Unhealthy Coping Skills  Participation Level:  Did Not Attend   Description of Group The focus of this group was to determine what unhealthy coping techniques typically are used by group members and what healthy coping techniques would be helpful in coping with various problems. Patients were guided in becoming aware of the differences between healthy and unhealthy coping techniques. Patients were asked to identify 2-3 healthy coping skills they would like to learn to use more effectively.  Therapeutic Goals Patients learned that coping is what human beings do all day long to deal with various situations in their lives Patients defined and discussed healthy vs unhealthy coping techniques Patients identified their preferred coping techniques and identified whether these were healthy or unhealthy Patients determined 2-3 healthy coping skills they would like to become more familiar with and use more often. Patients provided support and ideas to each other   Summary of Patient Progress:  During group, patient expressed understanding of the topic being discussed. Pt actively engaged in identifying unhealthy coping mechanisms utilized in the past.  Pt actively engaged in processing means of coping and what outcomes occur from such methods. Pt further engaged in discussion, identifying healthy coping mechanisms.  Pt engaged in processing the use of healthier mechanisms and how these produce different gains to unhealthy mechanisms. Pt actively identified other coping mechanisms they would be willing to try in the future. Pt proved receptive of alternate group members input and feedback from CSW.   Therapeutic Modalities Cognitive Behavioral Therapy Motivational Interviewing  Brittany [name], [place] 11/04/2021  2:07 PM

## 2021-11-04 NOTE — BHH Group Notes (Signed)
Adult Psychoeducational Group Note  Date:  11/04/2021 Time:  12:43 PM  Group Topic/Focus:  Orientation:   The focus of this group is to educate the patient on the purpose and policies of crisis stabilization and provide a format to answer questions about their admission.  The group details unit policies and expectations of patients while admitted.  Participation Level:  Active  Participation Quality:  Appropriate  Affect:  Appropriate  Cognitive:  Appropriate  Insight: Appropriate  Engagement in Group:  Engaged  Modes of Intervention:  Education  Additional Comments:  Pt's goal "to participate and get through today"  Kern Reap 11/04/2021, 12:43 PM

## 2021-11-04 NOTE — Progress Notes (Signed)
Progress note  Pt found in bed; compliant with medication administration. Pt denied all for this Clinical research associate. Pt seemed to show increasingly more insight into their plan to harm self and family. Pt states that they know they are sick and that their frustrations have been mounting, causing this lapse in judgement. Pt has been seen in the dayroom interacting with peers and in groups. Pt is pleasant. Pt denies si/hi/ah/vh and verbally agrees to approach staff if these become apparent or before harming themselves/others while at bhh.  A: Pt provided support and encouragement. Pt given medication per protocol and standing orders. Q59m safety checks implemented and continued.  R: Pt safe on the unit. Will continue to monitor.

## 2021-11-04 NOTE — Group Note (Signed)
Recreation Therapy Group Note   Group Topic:Stress Management  Group Date: 11/04/2021 Start Time: 0930 End Time: 1000 Facilitators: Caroll Rancher, LRT,CTRS Location: [address] Dayroom   Goal Area(s) Addresses:  Patient will actively participate in stress management techniques presented during session.  Patient will successfully identify benefit of practicing stress management post d/c.   Group Description:  Guided Imagery. LRT provided education, instruction, and demonstration on practice of visualization via guided imagery. Patient was asked to participate in the technique introduced during session. LRT debriefed including topics of mindfulness, stress management and specific scenarios each patient could use these techniques. Patients were given suggestions of ways to access scripts post d/c and encouraged to explore Youtube and other apps available on smartphones, tablets, and computers.   Clinical Observations/Individualized Feedback:  Unable to do group due to MHT group going over time.     Plan: Continue to engage patient in RT group sessions 2-3x/week.   Caroll Rancher, Antonietta Jewel 11/04/2021 12:52 PM

## 2021-11-04 NOTE — BHH Suicide Risk Assessment (Signed)
Suicide Risk Assessment  Admission Assessment    [facility] Admission Suicide Risk Assessment   Nursing information obtained from:  Patient Demographic factors:  Caucasian, Unemployed Current Mental Status:  Suicidal ideation indicated by patient, Thoughts of violence towards others, Suicidal ideation indicated by others, Plan to harm others, Intention to act on suicide plan, Belief that plan would result in death Loss Factors:  Financial problems / change in socioeconomic status, Decline in physical health Historical Factors:  Impulsivity Risk Reduction Factors:  Positive social support, Positive therapeutic relationship, Sense of responsibility to family, Living with another person, especially a relative, Positive coping skills or problem solving skills  Total Time spent with patient: 1 hour Principal Problem: Bipolar I disorder, most recent episode mixed ([place]) Diagnosis:  Principal Problem:   Bipolar I disorder, most recent episode mixed ([place]) Active Problems:   Borderline personality disorder ([place])   Bipolar affective disorder, mixed, severe, with psychotic behavior ([place])   Sedative, 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 female with a past psychiatric history of BPD, ADHD, Bipolar 1 disorder, >10 inpatient hospitalizations, ECT tx, MDD recurrent severe with psychosis, MDD recurrent severe without psychosis who presented to [facility] voluntarily with her husband for evaluation of increased mood lability, suicidal and homicidal ideations.   Patient reports poor sleep despite being on multiple medications for sleep, anhedonia, increased feelings of guilt and worthlessness, low energy, poor concentration and appetite, intermittent psychomotor changes, impulsiveness, feelings of no control, and suicidal ideations with plan via her personal vehicle.  Patient reports having gun to her head in past few days causing husband to remove firearms from home. Reports daily thoughts of suicidal ideations and recent feelings of wanting to kill her husband then herself".  Pt was transferred to the [facility] for continuous monitoring due to [facility] being at capacity, then transferred back to [facility] on 11/03/2021 voluntarily for treatment and stabilization of her symptoms.  Continued Clinical Symptoms:  Depression Symptoms:  depressed mood, anhedonia, insomnia, fatigue, feelings of worthlessness/guilt, difficulty concentrating, hopelessness, recurrent thoughts of death, suicidal thoughts with specific plan, anxiety, panic attacks, loss of energy/fatigue, disturbed sleep, Duration of Depression Symptoms: Greater than two weeks   (Hypo) Manic Symptoms:  Distractibility, Flight of Ideas, Impulsivity, Irritable Mood, Labiality of Mood  Anxiety Symptoms:   Panic Symptoms, Psychotic Symptoms:   n/a  Alcohol Use Disorder Identification Test Final Score (AUDIT): 0 The "Alcohol Use Disorders Identification Test", Guidelines for Use in Primary Care, Second Edition.  World Pharmacologist [facility]). Score between 0-7:  no or low risk or alcohol related problems. Score between 8-15:  moderate risk of alcohol related problems. Score between 16-19:  high risk of alcohol related problems. Score 20 or above:  warrants further diagnostic evaluation for alcohol dependence and treatment.  CLINICAL FACTORS:   Bipolar Disorder:   Mixed State Depression:   Anhedonia Hopelessness Impulsivity Insomnia  Musculoskeletal: Strength & Muscle Tone: within normal limits Gait & Station: normal Patient leans: N/A  Psychiatric Specialty Exam:  Presentation  General Appearance: Appropriate for Environment; Fairly Groomed  Eye Contact:Good  Speech:Clear and Coherent  Speech Volume:Normal  Handedness:Right  Mood and Affect   Mood:Depressed  Affect:Appropriate  Thought Process  Thought Processes:Coherent  Descriptions of Associations:Intact  Orientation:Full (Time, Place and Person)  Thought Content:Logical  History of Schizophrenia/Schizoaffective disorder:No  Duration of Psychotic Symptoms:N/A  Hallucinations:Hallucinations: None  Ideas of Reference:None  Suicidal Thoughts:Suicidal Thoughts: No  Homicidal Thoughts:Homicidal Thoughts: No  Sensorium  Memory:Immediate Good; Recent Good  Judgment:Fair  Insight:Fair  Executive Functions  Concentration:Fair  Attention Span:Fair  [place]  Psychomotor Activity  Psychomotor Activity:Psychomotor Activity: Normal  Assets  Assets:Communication Skills; Housing; Social Support  Sleep  Sleep:Sleep: Poor   Physical Exam: Physical Exam HENT:     Head: Normocephalic.     Right Ear: There is no impacted cerumen.     Nose: No congestion.  Eyes:     General:        Right eye: No discharge.        Left eye: No discharge.  Cardiovascular:     Heart sounds: No murmur heard.   No friction rub.  Pulmonary:     Effort: No respiratory distress.  Abdominal:     General: There is no distension.     Palpations: There is no mass.  Musculoskeletal:        General: Normal range of motion.     Cervical back: Normal range of motion. No rigidity.  Skin:    Coloration: Skin is not jaundiced or pale.  Neurological:     Mental Status: She is alert and oriented to person, place, and time.     Coordination: Coordination normal.  Psychiatric:        Thought Content: Thought content normal.   Review of Systems  Constitutional: Negative.   HENT: Negative.    Eyes: Negative.   Respiratory: Negative.    Cardiovascular: Negative.   Gastrointestinal: Negative.   Genitourinary: Negative.   Musculoskeletal: Negative.   Skin: Negative.   Neurological: Negative.   Endo/Heme/Allergies:         Allergies Ceclor  Psychiatric/Behavioral:  Positive for depression and substance abuse (marijuana abuse). The patient is nervous/anxious and has insomnia.   Blood pressure 102/79, pulse 99, temperature 98.5 F (36.9 C), temperature source Oral, resp. rate 18, height 5\' 4"  (1.626 m), weight 98 kg, SpO2 98 %. Body mass index is 37.08 kg/m.   COGNITIVE FEATURES THAT CONTRIBUTE TO RISK:  None    SUICIDE RISK:   Moderate:  Frequent suicidal ideation with limited intensity, and duration, some specificity in terms of plans, no associated intent, good self-control, limited dysphoria/symptomatology, some risk factors present, and identifiable protective factors, including available and accessible social support.  PLAN OF CARE:  PLAN Safety and Monitoring: Voluntary admission to inpatient psychiatric unit for safety, stabilization and treatment Daily contact with patient to assess and evaluate symptoms and progress in treatment Patient's case to be discussed in multi-disciplinary team meeting Observation Level : q15 minute checks Vital signs: q12 hours Precautions: suicide, elopement, and assault   Bipolar affective disorder, mixed, severe, without psychosis -Continue Vilazodone 40 mg at bedtime for depression -Start Topamax 25 mg BID for mood stabilization -Continue Haldol 5 mg BID (will discuss with patient regarding switching to    Latuda)   EKG from 1/24 with [id number]. Will repeat tomorrow (1/26). Triglycerides are 227. Pt educated on diet and exercise. Will order Hemoglobin A1C, TSH & baseline UA.   Anxiety -Start Xanax taper-Start 1.5 mg TID (pt on 2 mg TID at admission) -Start Ativan 1 mg TID PRN for CIWA >10   Nicotine addiction -Continue Nicoderm patch daily   Insomnia Start Trazodone 100 mg nightly for insomnia   Other PRNS -Continue Tylenol 650 mg every 6 hours PRN for mild pain -Continue Maalox 30 mg every 4  hrs PRN for indigestion -Continue Imodium 2-4 mg as needed for  diarrhea -Continue Milk of Magnesia as needed every 6 hrs for constipation -Continue Zofran disintegrating tabs every 6 hrs PRN for nausea    Physician Treatment Plan for Secondary Diagnosis:  Principal Problem:   Bipolar I disorder, most recent episode mixed ([place]) Active Problems:   Borderline personality disorder ([place])   Bipolar affective disorder, mixed, severe, with psychotic behavior ([place])   Sedative, 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[place])   PTSD (post-traumatic stress disorder)   Cannabis abuse    Long Term Goal(s): Improvement in symptoms so as ready for discharge   Short Term Goals: Ability to identify changes in lifestyle to reduce recurrence of condition will improve, Ability to verbalize feelings will improve, Ability to disclose and discuss suicidal ideas, Ability to demonstrate self-control will improve, Ability to identify and develop effective coping behaviors will improve, Ability to maintain clinical measurements within normal limits will improve, Compliance with prescribed medications will improve, and Ability to identify triggers associated with substance abuse/mental health issues will improve.   Discharge Planning: Social work and case management to assist with discharge planning and identification of hospital follow-up needs prior to discharge Estimated LOS: 5-7 days Discharge Concerns: Need to establish a safety plan; Medication compliance and effectiveness Discharge Goals: Return home with outpatient referrals for mental health follow-up including medication management/psychotherapy  I certify that inpatient services furnished can reasonably be expected to improve the patient's condition.   Nicholes Rough, NP 11/04/2021, 5:17 PM

## 2021-11-04 NOTE — Plan of Care (Signed)
  Problem: Education: Goal: Ability to state activities that reduce stress will improve Outcome: Progressing   Problem: Coping: Goal: Ability to identify and develop effective coping behavior will improve Outcome: Progressing   Problem: Self-Concept: Goal: Ability to identify factors that promote anxiety will improve Outcome: Progressing   

## 2021-11-04 NOTE — BH IP Treatment Plan (Signed)
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[place])   PTSD (post-traumatic stress disorder)   Cannabis abuse   Current Medications:  Current Facility-Administered Medications  Medication Dose Route Frequency Provider Last Rate Last Admin   acetaminophen (TYLENOL) tablet 650 mg  650 mg Oral Q6H PRN Starkes-Perry, Gayland Curry, FNP       ALPRAZolam Duanne Moron) tablet 1.5 mg  1.5 mg Oral TID Maida Sale, MD       alum & mag hydroxide-simeth (MAALOX/MYLANTA) 200-200-20 MG/5ML suspension 30 mL  30 mL Oral Q4H PRN Starkes-Perry, Gayland Curry, FNP       haloperidol (HALDOL) tablet 5 mg  5 mg Oral Q6H PRN Hill, Jackie Plum, MD       And   benztropine (COGENTIN) tablet 1 mg  1 mg Oral Q6H PRN Hill, Jackie Plum, MD       haloperidol lactate (HALDOL) injection 5 mg  5 mg Intramuscular Q6H PRN Hill, Jackie Plum, MD       And   benztropine mesylate (COGENTIN) injection 1 mg  1 mg Intramuscular Q6H PRN Hill, Jackie Plum, MD       haloperidol (HALDOL) tablet 5 mg  5 mg Oral BID Suella Broad, FNP   5 mg at 11/04/21 0757   loperamide (IMODIUM) capsule 2 mg  2 mg Oral QID PRN Suella Broad, FNP   2 mg at 11/03/21 1832   LORazepam (ATIVAN) tablet 1 mg  1 mg Oral TID PRN Maida Sale, MD       magnesium hydroxide (MILK OF MAGNESIA) suspension 30 mL  30 mL Oral Daily PRN Starkes-Perry, Gayland Curry, FNP       nicotine (NICODERM CQ - dosed in mg/24 hours) patch 14 mg  14 mg Transdermal Daily [name], MD   14 mg at 11/04/21 0757   topiramate (TOPAMAX) tablet 25 mg  25 mg Oral  BID Maida Sale, MD       traZODone (DESYREL) tablet 50 mg  50 mg Oral QHS Suella Broad, FNP   50 mg at 11/03/21 2127   Vilazodone HCl TABS 40 mg  40 mg Oral QHS Suella Broad, FNP   40 mg at 11/03/21 2125   PTA Medications: Medications Prior to Admission  Medication Sig Dispense Refill Last Dose   allopurinol (ZYLOPRIM) 300 MG tablet Take 300 mg by mouth daily.      alprazolam (XANAX) 2 MG tablet Take 2 mg by mouth 3 (three) times daily.      loperamide (IMODIUM A-D) 2 MG tablet Take 2 mg by mouth 4 (four) times daily as needed for diarrhea or loose stools.      Vilazodone HCl (VIIBRYD) 40 MG TABS Take 40 mg by mouth in the morning and at bedtime.      VYVANSE 70 MG capsule Take 70 mg by mouth every morning.      ziprasidone (GEODON) 80 MG capsule Take 160 mg by mouth 2 (two) times daily.       Patient Stressors: Financial difficulties   Health problems   Marital or family conflict   Substance abuse    Patient Strengths: Ability for insight  Average or above average intelligence  Communication skills  Motivation for treatment/growth  Physical Health  Supportive family/friends  Work skills   Treatment Modalities: Medication Management, Group therapy, Case management,  1 to 1 session with clinician, Psychoeducation, Recreational therapy.   Physician Treatment Plan for Primary Diagnosis: Bipolar affective disorder, mixed, severe, with psychotic behavior ([place]) Long Term Goal(s):     Short Term Goals:    Medication Management: Evaluate patient's response, side effects, and tolerance of medication regimen.  Therapeutic Interventions: 1 to 1 sessions, Unit Group sessions and Medication administration.  Evaluation of Outcomes: Not Met  Physician Treatment Plan for Secondary Diagnosis: Principal Problem:   Bipolar affective disorder, mixed, severe, with psychotic behavior ([place]) Active Problems:   Borderline personality disorder ([place])   Sedative,  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[place])   PTSD (post-traumatic stress disorder)   Cannabis abuse  Long Term Goal(s):     Short Term Goals:       Medication Management: Evaluate patient's response, side effects, and tolerance of medication regimen.  Therapeutic Interventions: 1 to 1 sessions, Unit Group sessions and Medication administration.  Evaluation of Outcomes: Not Met   RN Treatment Plan for Primary Diagnosis: Bipolar affective disorder, mixed, severe, with psychotic behavior ([place]) Long Term Goal(s): Knowledge of disease and therapeutic regimen to maintain health will improve  Short Term Goals: Ability to remain free from injury will improve, Ability to participate in decision making will improve, Ability to verbalize feelings will improve, Ability to disclose and discuss suicidal ideas, and Ability to identify and develop effective coping behaviors will improve  Medication Management: RN will administer medications as ordered by provider, will assess and evaluate patient's response and provide education to patient for prescribed medication. RN will report any adverse and/or side effects to prescribing provider.  Therapeutic Interventions: 1 on 1 counseling sessions, Psychoeducation, Medication administration, Evaluate responses to treatment, Monitor vital signs and CBGs as ordered, Perform/monitor CIWA, COWS, AIMS and Fall Risk screenings as ordered, Perform wound care treatments as ordered.  Evaluation of Outcomes: Not Met   [name] Treatment Plan for Primary Diagnosis: Bipolar affective disorder, mixed, severe, with psychotic behavior ([place]) Long Term Goal(s): Safe transition to appropriate next level of care at discharge, Engage patient in therapeutic group addressing interpersonal concerns.  Short Term Goals: Engage patient in aftercare planning with referrals and resources, Increase social support, Increase emotional regulation, Facilitate acceptance of mental health diagnosis  and concerns, Identify triggers associated with mental health/substance abuse issues, and Increase skills for wellness and recovery  Therapeutic Interventions: Assess for all discharge needs, 1 to 1 time with Social worker, Explore available resources and support systems, Assess for adequacy in community support network, Educate family and significant other(s) on suicide prevention, Complete Psychosocial Assessment, Interpersonal group therapy.  Evaluation of Outcomes: Not Met   Progress in Treatment: Attending groups: Yes. Participating in groups: Yes. Taking medication as prescribed: Yes. Toleration medication: Yes. Family/Significant other contact made: Yes, individual(s) contacted:  Husband  Patient understands diagnosis: Yes. Discussing patient identified problems/goals with staff: Yes. Medical problems stabilized or resolved: Yes. Denies suicidal/homicidal ideation: Yes. Issues/concerns per patient self-inventory: No.   New problem(s) identified: No, Describe:  None   New Short Term/Long Term Goal(s): medication stabilization, elimination of SI thoughts, development of comprehensive mental wellness plan.   Patient Goals: "Medication Management"  Discharge Plan or Barriers: Patient recently admitted. CSW will continue to follow and assess for appropriate referrals and possible discharge planning.   Reason for Continuation of Hospitalization:  Anxiety Depression Homicidal ideation [place] Medication stabilization Suicidal ideation  Estimated Length of Stay: 3 to 5 days    Scribe for Treatment Team: Darleen Crocker, Latanya Presser 11/04/2021 3:14 PM

## 2021-11-04 NOTE — Progress Notes (Signed)
°   11/04/21 2300  Psych Admission Type (Psych Patients Only)  Admission Status Voluntary  Psychosocial Assessment  Patient Complaints Anxiety  Eye Contact Fair  Facial Expression Animated;Anxious  Affect Anxious;Appropriate to circumstance  Speech Logical/coherent  Interaction Assertive  Motor Activity Slow  Appearance/Hygiene Unremarkable  Behavior Characteristics Cooperative  Mood Anxious  Thought Process  Coherency Concrete thinking  Content Blaming others;Blaming self  Delusions None reported or observed  Perception WDL  Hallucination None reported or observed  Judgment Poor  Confusion None  Danger to Self  Current suicidal ideation? Denies  Danger to Others  Danger to Others None reported or observed   Patient compliant with medication stated she would like to talk to the Provider in the morning about her medications would not elaborated stating "I need my real Doctor they will be able to change the medications for me" Support and encouragement provided. No adverse drug noted.

## 2021-11-05 LAB — URINALYSIS, ROUTINE W REFLEX MICROSCOPIC
Bilirubin Urine: NEGATIVE
Glucose, UA: NEGATIVE mg/dL
Hgb urine dipstick: NEGATIVE
Ketones, ur: NEGATIVE mg/dL
Leukocytes,Ua: NEGATIVE
Nitrite: NEGATIVE
Protein, ur: NEGATIVE mg/dL
Specific Gravity, Urine: 1.018 (ref 1.005–1.030)
pH: 6 (ref 5.0–8.0)

## 2021-11-05 MED ORDER — MELATONIN 5 MG PO TABS
5.0000 mg | ORAL_TABLET | Freq: Every day | ORAL | Status: DC
  Administered 2021-11-05 – 2021-11-06 (×2): 5 mg via ORAL
  Filled 2021-11-05 (×5): qty 1

## 2021-11-05 MED ORDER — NICOTINE POLACRILEX 2 MG MT GUM
2.0000 mg | CHEWING_GUM | OROMUCOSAL | Status: DC | PRN
  Administered 2021-11-05 (×2): 2 mg via ORAL
  Filled 2021-11-05: qty 1
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
## 2021-11-05 NOTE — BHH Group Notes (Signed)
1400 PsychoEducational Group- °Positive Reframing education was discussed during this group. The Dalia Lama poem on ''watch your thoughts they become actions'' was read and patients were given examples of positive reframing. Pt participated and was engaged and appropriate. °

## 2021-11-05 NOTE — Plan of Care (Signed)
Nuarse discussed anxiety, depression and coping skills with patient.

## 2021-11-05 NOTE — Progress Notes (Signed)
D:  Patient's self inventory sheet, patient has poor sleep, sleep medication not helpful.  Fair appetite, normal energy level, good concentration.  Rated depression 1, denied hopeless and anxiety.  Denied SI.  Denied physical problems.  Physical pain, knees and feet.  Goal is med management.  Plans to talk to MD.  No discharge plans. A:  Medications administered per MD orders.  Emotional support and encouragement given patient. R:  Denied SI and HI, contracts for safety.  Denied A/V hallucinations.  Safety maintained with 15 minute checks.

## 2021-11-05 NOTE — Progress Notes (Signed)
Patient stated no sleep for four nights.  Vyvanse needed to focus and cooperate successfully in groups.  Cannot attend groups without vyvanse, and then stated she would try to attend groups. Denied SI and HI, contracts for safety.  Denied A/V hallucinations.   Patient talked to pharmacist this morning and wants to talk to staff about meds asap.

## 2021-11-05 NOTE — Group Note (Signed)
Date:  11/05/2021 Time:  9:50 AM  Group Topic/Focus:  Orientation:   The focus of this group is to educate the patient on the purpose and policies of crisis stabilization and provide a format to answer questions about their admission.  The group details unit policies and expectations of patients while admitted.    Participation Level:  Active  Participation Quality:  Appropriate  Affect:  Appropriate  Cognitive:  Appropriate  Insight: Appropriate  Engagement in Group:  Engaged  Modes of Intervention:  Discussion  Additional Comments:  Very engaged in group.  [name] 11/05/2021, 9:50 AM

## 2021-11-05 NOTE — Progress Notes (Signed)
The patient rated her day as a 10 out of 10. She verbalizes that she had a good visit this evening and that her medications were addressed by the doctor. Her goal for tomorrow is to get discharged.

## 2021-11-06 LAB — HEMOGLOBIN A1C
Hgb A1c MFr Bld: 5 % (ref 4.8–5.6)
Mean Plasma Glucose: 97 mg/dL

## 2021-11-06 NOTE — Group Note (Signed)
Date:  11/06/2021 Time:  10:21 AM  Group Topic/Focus:  Orientation:   The focus of this group is to educate the patient on the purpose and policies of crisis stabilization and provide a format to answer questions about their admission.  The group details unit policies and expectations of patients while admitted.    Participation Level:  Active  Participation Quality:  Appropriate  Affect:  Appropriate  Cognitive:  Appropriate  Insight: Good  Engagement in Group:  Engaged  Modes of Intervention:  Discussion  Additional Comments:  Engaged in group.  [name] 11/06/2021, 10:21 AM

## 2021-11-06 NOTE — Plan of Care (Signed)
°  Problem: Self-Concept: Goal: Level of anxiety will decrease Outcome: Progressing Goal: Ability to modify response to factors that promote anxiety will improve Outcome: Progressing   Problem: Education: Goal: Utilization of techniques to improve thought processes will improve Outcome: Progressing   
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
## 2021-11-06 NOTE — Progress Notes (Signed)
°   11/05/21 2000  Psych Admission Type (Psych Patients Only)  Admission Status Voluntary  Psychosocial Assessment  Patient Complaints Insomnia;Anxiety  Eye Contact Fair  Facial Expression Animated;Anxious  Affect Anxious;Appropriate to circumstance  Speech Logical/coherent  Interaction Assertive  Motor Activity Slow  Appearance/Hygiene Unremarkable  Behavior Characteristics Cooperative  Mood Anxious  Thought Process  Coherency Concrete thinking  Content Blaming others;Blaming self  Delusions None reported or observed  Perception WDL  Hallucination None reported or observed  Judgment Poor  Confusion None  Danger to Self  Current suicidal ideation? Denies  Danger to Others  Danger to Others None reported or observed

## 2021-11-06 NOTE — Progress Notes (Signed)
Progress note  Pt found in bed; compliant with medication administration. Pt presents animated and appropriate. Pt expresses readiness for discharge. Pt denies all detox symptoms. Pt has been seen in the milieu interacting appropriately. Pt denies si/hi/ah/vh and verbally agrees to approach staff if these become apparent or before harming themselves/others while at [facility].  A: Pt provided support and encouragement. Pt given medication per protocol and standing orders. Q3m safety checks implemented and continued.  R: Pt safe on the unit. Will continue to monitor.

## 2021-11-06 NOTE — [facility] Group Notes (Signed)
Adult Psychoeducational Group Note  Date:  11/06/2021 Time:  2:55 PM  Group Topic/Focus:  Wellness Toolbox:   The focus of this group is to discuss various aspects of wellness, balancing those aspects and exploring ways to increase the ability to experience wellness.  Patients will create a wellness toolbox for use upon discharge.  Participation Level:  Active  Participation Quality:  Attentive  Affect:  Appropriate  Cognitive:  Alert  Insight: Appropriate  Engagement in Group:  Engaged  Modes of Intervention:  Activity  Additional Comments:  Patient attended and participated in the relaxation group activity.  Jearl Klinefelter 11/06/2021, 2:55 PM

## 2021-11-06 NOTE — Group Note (Signed)
Recreation Therapy Group Note   Group Topic:Stress Management  Group Date: 11/06/2021 Start Time: 0935 End Time: 0955 Facilitators: Caroll Rancher, LRT,CTRS Location: [address] Dayroom   Goal Area(s) Addresses:  Patient will actively participate in stress management techniques presented during session.  Patient will successfully identify benefit of practicing stress management post d/c.   Group Description: Guided Imagery. LRT provided education, instruction, and demonstration on practice of visualization via guided imagery. Patient was asked to participate in the technique introduced during session. LRT debriefed including topics of mindfulness, stress management and specific scenarios each patient could use these techniques. Patients were given suggestions of ways to access scripts post d/c and encouraged to explore Youtube and other apps available on smartphones, tablets, and computers.   Affect/Mood: Appropriate   Participation Level: Active   Participation Quality: Independent   Behavior: Appropriate   Speech/Thought Process: Focused   Insight: Good   Judgement: Good   Modes of Intervention: Script   Patient Response to Interventions:  Engaged   Education Outcome:  Acknowledges education and In group clarification offered    Clinical Observations/Individualized Feedback: Pt attended and participated in activity.    Plan: Continue to engage patient in RT group sessions 2-3x/week.   Caroll Rancher, LRT,CTRS 11/06/2021 12:10 PM

## 2021-11-06 NOTE — Group Note (Signed)
Type of Therapy and Topic:  Group Therapy:  Stress Management   Participation Level:  Active    Description of Group:  Patients in this group were introduced to the idea of stress and encouraged to discuss negative and positive ways to manage stress. Patients discussed specific stressors that they have in their life right now and the physical signs and symptoms associated with that stress.  Patient encouraged to come up with positive changes to assist with the stress upon discharge in order to prevent future hospitalizations.   They also worked as a group on developing a specific plan for several patients to deal with stressors through boundary-setting, psychoeducation and self care techniques   Therapeutic Goals:               1)  To discuss the positive and negative impacts of stress             2)  identify signs and symptoms of stress             3)  generate ideas for stress management             4)  offer mutual support to others regarding stress management             5)  Developing plans for ways to manage specific stressors upon discharge               Summary of Patient Progress:  Patient was resistant at the beginning of group but then opened up and discussed that she was glad she came.  Patient reports that she has overcoming coming to group.  Patient participated in circle of control activity to assist with stress management.  She mainly discussed her stress over medications.  Patient was open to giving and receiving feedback from group peers.    Therapeutic Modalities:   Motivational Interviewing Brief Solution-Focused Therapy   Chinwe Lope, [name], LCAS Clincal Social Worker  [facility]

## 2021-11-07 DIAGNOSIS — F3160 Bipolar disorder, current episode mixed, unspecified: Secondary | ICD-10-CM

## 2021-11-07 MED ORDER — MELATONIN 5 MG PO TABS: 5.0000 mg | ORAL_TABLET | Freq: Every day | ORAL | 0 refills | Status: DC

## 2021-11-07 MED ORDER — NICOTINE POLACRILEX 2 MG MT GUM: 2.0000 mg | CHEWING_GUM | OROMUCOSAL | 0 refills | Status: DC | PRN

## 2021-11-07 MED ORDER — HALOPERIDOL 5 MG PO TABS: 5.0000 mg | ORAL_TABLET | Freq: Two times a day (BID) | ORAL | 0 refills | Status: DC

## 2021-11-07 MED ORDER — ALPRAZOLAM 1 MG PO TABS: 1.5000 mg | ORAL_TABLET | Freq: Three times a day (TID) | ORAL | 0 refills | Status: DC

## 2021-11-07 MED ORDER — TOPIRAMATE 25 MG PO TABS: 25.0000 mg | ORAL_TABLET | Freq: Two times a day (BID) | ORAL | 0 refills | Status: DC

## 2021-11-07 MED ORDER — TRAZODONE HCL 100 MG PO TABS: 100.0000 mg | ORAL_TABLET | Freq: Every day | ORAL | 0 refills | Status: DC

## 2021-11-07 NOTE — Progress Notes (Signed)
°   11/06/21 2130  Psych Admission Type (Psych Patients Only)  Admission Status Voluntary  Psychosocial Assessment  Patient Complaints None  Eye Contact Fair  Facial Expression Animated  Affect Appropriate to circumstance  Speech Logical/coherent  Interaction Assertive  Motor Activity Other (Comment) (WDL)  Appearance/Hygiene Unremarkable  Behavior Characteristics Cooperative;Appropriate to situation  Mood Pleasant  Thought Process  Coherency WDL  Content WDL  Delusions None reported or observed  Perception WDL  Hallucination None reported or observed  Judgment WDL  Confusion None  Danger to Self  Current suicidal ideation? Denies  Danger to Others  Danger to Others None reported or observed

## 2021-11-07 NOTE — Discharge Summary (Signed)
Physician Discharge Summary Note  Patient:  Brittany [name] is an 36 y.o., female MRN:  [id number] DOB:  1986/07/22 Patient phone:  [phone] (home)  Patient address:   [address] [place] [address],  Total Time spent with patient: 30 minutes  Date of Admission:  11/03/2021 Date of Discharge: 11/07/21  Reason for Admission:  History of Present Illness: As per [facility] assessment on 11/01/2021, "Brittany [name] is an 36 y.o. female with a past psychiatric history of BPD, ADHD, Bipolar 1 disorder, >10 inpatient hospitalizations, ECT tx, MDD recurrent severe with psychosis, MDD recurrent severe without psychosis who presented to [facility] voluntarily with her husband for evaluation of increased mood lability, suicidal and homicidal ideations.   Patient reports poor sleep despite being on multiple medications for sleep, anhedonia, increased feelings of guilt and worthlessness, low energy, poor concentration and appetite, intermittent psychomotor changes, impulsiveness, feelings of no control, and suicidal ideations with plan via her personal vehicle. Patient reports having gun to her head in past few days causing husband to remove firearms from home. Reports daily thoughts of suicidal ideations and recent feelings of wanting to kill her husband then herself".  Pt was transferred to the [facility] for continuous monitoring due to [facility] being at capacity, then transferred back to [facility] on 11/03/2021 voluntarily for treatment and stabilization of her symptoms.    Principal Problem: Bipolar I disorder, most recent episode mixed ([place]) Discharge Diagnoses: Principal Problem:   Bipolar I disorder, most recent episode mixed ([place]) Active Problems:   Borderline personality disorder (HCC)   Bipolar affective disorder, mixed, severe, with psychotic behavior ([place])   Sedative, 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[place])   PTSD (post-traumatic stress disorder)   Cannabis abuse   Past Psychiatric History: MDD, GAD, bipolar  d/o, anxiety, PTSD  Past Medical History:  Past Medical History:  Diagnosis Date   ADHD (attention deficit hyperactivity disorder)    Anxiety    Arthritis    right shoulder, bilateral knees   Bipolar disorder (HCC)    Bipolar I disorder, most recent episode depressed ([place])    Chronic kidney disease    Kidney stones   Depression    Gastro-esophageal reflux disease without esophagitis 11/04/2021   Headache     Past Surgical History:  Procedure Laterality Date   CESAREAN SECTION     CHOLECYSTECTOMY  2017   FOOT SURGERY     KIDNEY SURGERY     NOVASURE ABLATION  2010   SHOULDER SURGERY Right    TUBAL LIGATION     Family History:  Family History  Problem Relation Age of Onset   Anxiety disorder Mother    Depression Mother    Drug abuse Mother    Bipolar disorder Mother    Depression Father    Anxiety disorder Father    Drug abuse Father    Anxiety disorder Sister    Depression Sister    ADD / ADHD Sister    Anxiety disorder Sister    Depression Sister    Family Psychiatric  History:   Anxiety disorder Mother     Depression Mother     Drug abuse Mother     Bipolar disorder Mother     Depression Father     Anxiety disorder Father     Drug abuse Father     Anxiety disorder Sister     Depression Sister     ADD / ADHD Sister     Anxiety disorder Sister     Depression  Sister    Social History:  Social History   Substance and Sexual Activity  Alcohol Use Not Currently   Alcohol/week: 0.0 standard drinks   Comment: pt repeorts maybe drinks once a month     Social History   Substance and Sexual Activity  Drug Use Yes   Types: Marijuana   Comment: occasional to help with anxiety    Social History   Socioeconomic History   Marital status: Married    Spouse name: Not on file   Number of children: Not on file   Years of education: Not on file   Highest education level: Not on file  Occupational History   Not on file  Tobacco Use   Smoking status: Every  Day    Types: E-cigarettes   Smokeless tobacco: Never   Tobacco comments:    occasional  Vaping Use   Vaping Use: Every day   Substances: Nicotine  Substance and Sexual Activity   Alcohol use: Not Currently    Alcohol/week: 0.0 standard drinks    Comment: pt repeorts maybe drinks once a month   Drug use: Yes    Types: Marijuana    Comment: occasional to help with anxiety   Sexual activity: Yes    Birth control/protection: None  Other Topics Concern   Not on file  Social History Narrative   Not on file   Social Determinants of Health   Financial Resource Strain: Not on file  Food Insecurity: Not on file  Transportation Needs: Not on file  Physical Activity: Not on file  Stress: Not on file  Social Connections: Not on file    Hospital Course:  During the course of patient's hospitalization, the 15-minute checks were adequate to ensure patient's safety. Patient did not exhibit erratic or aggressive behavior and was compliant with scheduled medication. Patient was recommended for outpatient psychiatry.  At the time of discharge patient is not reporting any acute suicidal/homicidal ideations/AVH, delusional thoughts or paranoia. Patient did not appear to be responding to any internal stimuli. Patient feels more confident about self-care & in managing their mental health problems. Patient currently denies any new issues or concerns. Education and supportive counseling provided throughout patient's hospital stay & upon discharge.   Today upon discharge evaluation, the patient gives a mood of "good". Patient denies any specific concerns. Patient slept well, appetite good, regular bowel movements. Patient denies any new physical complaints. Patient feels that the medications have been helpful & is in agreement to continue current treatment regimen as recommended. Patient was able to engage in safety planning including plan to return to [facility] or contact emergency services if patient feels  unable to maintain their own safety or the safety of others. Patient had no further questions, comments, or concerns. Patient left [facility] with all personal belongings in no apparent distress. Transportation per safe transport to home was arranged for patient.   Physical Findings: AIMS: Facial and Oral Movements Muscles of Facial Expression: None, normal Lips and Perioral Area: None, normal Jaw: None, normal Tongue: None, normal,Extremity Movements Upper (arms, wrists, hands, fingers): None, normal Lower (legs, knees, ankles, toes): None, normal, Trunk Movements Neck, shoulders, hips: None, normal, Overall Severity Severity of abnormal movements (highest score from questions above): None, normal Incapacitation due to abnormal movements: None, normal Patient's awareness of abnormal movements (rate only patient's report): No Awareness, Dental Status Current problems with teeth and/or dentures?: No Does patient usually wear dentures?: No  CIWA:  CIWA-Ar Total: 0 COWS:  Musculoskeletal: Strength & Muscle Tone: within normal limits Gait & Station: normal Patient leans: N/A   Psychiatric Specialty Exam:  Presentation  General Appearance: Appropriate for Environment  Eye Contact:Fair  Speech:Normal Rate  Speech Volume:Normal  Handedness:Right   Mood and Affect  Mood:Euthymic  Affect:Appropriate   Thought Process  Thought Processes:Coherent  Descriptions of Associations:Intact  Orientation:Full (Time, Place and Person)  Thought Content:Logical  History of Schizophrenia/Schizoaffective disorder:No  Duration of Psychotic Symptoms:Less than six months  Hallucinations:Hallucinations: None  Ideas of Reference:None  Suicidal Thoughts:Suicidal Thoughts: No  Homicidal Thoughts:Homicidal Thoughts: No   Sensorium  Memory:Immediate Fair; Remote Fair  Judgment:Fair  Insight:Fair   Executive Functions  Concentration:Fair  Attention  Span:Fair  [place]   Psychomotor Activity  Psychomotor Activity:No data recorded  Assets  Assets:Communication Skills; Housing; Physical Health   Sleep  Sleep:Sleep: Fair    Physical Exam: Physical Exam Vitals and nursing note reviewed.  Constitutional:      Appearance: Normal appearance.  HENT:     Head: Normocephalic.  Eyes:     Extraocular Movements: Extraocular movements intact.  Pulmonary:     Effort: Pulmonary effort is normal.  Musculoskeletal:        General: Normal range of motion.     Cervical back: Normal range of motion.  Neurological:     General: No focal deficit present.     Mental Status: She is alert and oriented to person, place, and time.  Psychiatric:        Attention and Perception: Attention and perception normal.        Mood and Affect: Mood and affect normal.        Speech: Speech normal.        Behavior: Behavior normal. Behavior is cooperative.        Thought Content: Thought content normal.        Cognition and Memory: Cognition and memory normal.        Judgment: Judgment normal.   Review of Systems  Constitutional:  Negative for fever.  Respiratory:  Negative for cough.   Cardiovascular:  Negative for chest pain.  Gastrointestinal:  Negative for constipation, diarrhea, nausea and vomiting.  Genitourinary:  Negative for dysuria.  Musculoskeletal:  Positive for myalgias. Negative for back pain.  Skin:  Negative for rash.  Neurological:  Negative for dizziness and headaches.  Blood pressure 115/86, pulse 94, temperature 97.6 F (36.4 C), temperature source Oral, resp. rate 16, height 5\' 4"  (1.626 m), weight 98 kg, SpO2 99 %. Body mass index is 37.08 kg/m.   Social History   Tobacco Use  Smoking Status Every Day   Types: E-cigarettes  Smokeless Tobacco Never  Tobacco Comments   occasional   Tobacco Cessation:  A prescription for an FDA-approved tobacco cessation medication  provided at discharge   Blood Alcohol level:  Lab Results  Component Value Date   [facility] <10 11/08/2018   ETH <10 [id number]    Metabolic Disorder Labs:  Lab Results  Component Value Date   HGBA1C 5.0 11/05/2021   MPG 97 11/05/2021   MPG 99.67 11/08/2018   Lab Results  Component Value Date   PROLACTIN 25.3 (H) 07/21/2017   PROLACTIN 57.7 (H) 06/10/2017   Lab Results  Component Value Date   CHOL 203 (H) 11/03/2021   TRIG 227 (H) 11/03/2021   HDL 37 (L) 11/03/2021   CHOLHDL 5.5 11/03/2021   VLDL 45 (H) 11/03/2021   LDLCALC 121 (H) 11/03/2021  [place] 138 (H) 11/08/2018    See Psychiatric Specialty Exam and Suicide Risk Assessment completed by Attending Physician prior to discharge.  Discharge destination:  Home  Is patient on multiple antipsychotic therapies at discharge:  Yes,   Do you recommend tapering to monotherapy for antipsychotics?  Yes   Has Patient had three or more failed trials of antipsychotic monotherapy by history:  Yes,   Antipsychotic medications that previously failed include:   1.  geodon., 2.  abilify., and 3.  seroquel.  Recommended Plan for Multiple Antipsychotic Therapies: Patient's medications are in the process of a cross-taper;  medications include:  haldol, viibryd  Discharge Instructions     Diet - low sodium heart healthy   Complete by: As directed    Discharge instructions   Complete by: As directed    Follow up with psychiatry and therapy.   Increase activity slowly   Complete by: As directed       Allergies as of 11/07/2021       Reactions   Ceclor [cefaclor] Anaphylaxis, Hives, Nausea And Vomiting   Wasabi Malena Edman [place]) Anaphylaxis   Adhesive [tape] Other (See Comments)   Red, blisters        Medication List     STOP taking these medications    Vyvanse 70 MG capsule Generic drug: lisdexamfetamine   ziprasidone 80 MG capsule Commonly known as: GEODON       TAKE these medications      Indication   allopurinol 300 MG tablet Commonly known as: ZYLOPRIM Take 300 mg by mouth daily. What changed: Another medication with the same name was removed. Continue taking this medication, and follow the directions you see here.  Indication: Acute Gout Attacks   ALPRAZolam 1 MG tablet Commonly known as: XANAX Take 1.5 tablets (1.5 mg total) by mouth 3 (three) times daily. What changed:  medication strength how much to take  Indication: Feeling Anxious   diclofenac 50 MG EC tablet Commonly known as: VOLTAREN Take 1 tablet by mouth 2 (two) times daily.  Indication: Gout   haloperidol 5 MG tablet Commonly known as: HALDOL Take 1 tablet (5 mg total) by mouth 2 (two) times daily.  Indication: Manic Phase of Manic-Depression   loperamide 2 MG tablet Commonly known as: IMODIUM A-D Take 2 mg by mouth 4 (four) times daily as needed for diarrhea or loose stools.  Indication: Diarrhea   melatonin 5 MG Tabs Take 1 tablet (5 mg total) by mouth at bedtime.  Indication: Trouble Sleeping   nicotine polacrilex 2 MG gum Commonly known as: NICORETTE Take 1 each (2 mg total) by mouth as needed for smoking cessation.  Indication: Nicotine Addiction   topiramate 25 MG tablet Commonly known as: TOPAMAX Take 1 tablet (25 mg total) by mouth 2 (two) times daily.  Indication: Antipsychotic Therapy-Induced Weight Gain, impulsivity and chronic pain   traZODone 100 MG tablet Commonly known as: DESYREL Take 1 tablet (100 mg total) by mouth at bedtime.  Indication: Trouble Sleeping   Vilazodone HCl 40 MG Tabs Commonly known as: VIIBRYD Take 40 mg by mouth in the morning and at bedtime.  Indication: Major Depressive Disorder        Follow-up Information     [place], Rosalyn Charters, MD. Go on 11/13/2021.   Specialty: Internal Medicine Why: You have an appointment for primary care services on 11/13/21 at 11:10 am.  This appointment will be held in person. Contact information: [address] [place] [address]  [place]. Go on 11/12/2021.   Why: You have a hospital follow-up appointment for therapy and medication management services on 11/12/21 at 8:30 am. This appointment will be held in person. Contact information: [address] [place] [address] [phone]                 Follow-up recommendations:  Activity:  ad lib Diet:  cardiac diet Other:    Recommend that psychiatric medications be managed by a psychiatrist  Prescriptions for new medications provided for the patient to bridge to follow up appointment. The patient was informed that refills for these prescriptions are generally not provided, and patient is encouraged to attend all follow up appointments to address medication refills and adjustments.   Today's discharge was reviewed with treatment team, and the team is in agreement that the patient is ready for discharge. The patient is was of the discharge plan for today and has been given opportunity to ask questions. At time of discharge, the patient does not vocalize any acute harm to self or others, is goal directed, able to advocate for self and organizational baseline.   At discharge, the patient is instructed to:  Take all medications as prescribed. Report any adverse effects and or reactions from the medicines to her outpatient provider promptly.  Do not engage in alcohol and/or illegal drug use while on prescription medicines.  In the event of worsening symptoms, patient is instructed to call the crisis hotline, 911 and or go to the nearest ED for appropriate evaluation and treatment of symptoms.  Follow-up with primary care provider for further care of medical issues, concerns and or health care needs. * Substance abuse follow up: it is recommended that you follow up with community support treatment, like AA/NA. It is also recommended that the patient attend 90 meetings in 90 days, otherwise known as  "74 in 64" * Pregnancy: Mood stabilizing agents and other medications used in psychiatry may pose risk to pregnancy. Women of childbearing age are advised to use birth control. If you are planning on becoming pregnant, please discuss with both your OBGYN and your psychiatrist prior to stopping medication and prior to pregnancy.     Signed: Maida Sale, MD 11/07/2021, 10:56 AM

## 2021-11-07 NOTE — Progress Notes (Signed)
Patient ID: Brittany [name], female   DOB: [birth year], 36 y.o.   MRN: [id number] Patient discharged to home/self care in the presence of her spouse. Patient acknowledged understanding of all discharge instructions and receipt of personal belongings.

## 2021-11-07 NOTE — BHH Group Notes (Signed)
[facility] [name] Group Therapy Note  Date/Time:    11/07/2021 10:00-11:00AM  Type of Therapy and Topic:  Group Therapy:  Healthy vs Unhealthy Coping Skills  Participation Level:  Active as long as present, but left early due to back pain  Description of Group:  The focus of this group was to determine what unhealthy coping techniques typically are used by group members and what healthy coping techniques would be helpful in coping with various problems. Patients were guided in becoming aware of the differences between healthy and unhealthy coping techniques.  Patients were asked to identify 1-2 healthy coping skills they would like to learn to use more effectively, and many mentioned meditation, breathing, and relaxation.  At the end of group, additional ideas of healthy coping skills were shared in a fun exercise.  Therapeutic Goals Patients learned that coping is what human beings do all day long to deal with various situations in their lives Patients defined and discussed healthy vs unhealthy coping techniques Patients identified their preferred coping techniques and identified whether these were healthy or unhealthy Patients determined 1-2 healthy coping skills they would like to become more familiar with and use more often Patients provided support and ideas to each other  Summary of Patient Progress: During group, patient expressed that unhealthy coping skills she uses include vaping and smoking marijuana.  She initially said that when marijuana becomes legal, she will smoke it and it will be better, but was able to quickly see the point made by others that being legal does not make it healthy.  She stated that one self-soothing technique she uses a lot is prayer.  She had to get up and leave the group to walk in the hallway, citing back pain.  She was invited to stand within the room but declined.   Therapeutic Modalities Cognitive Behavioral Therapy Motivational Interviewing   Ambrose Mantle, [name] 11/07/2021, 1:31 PM

## 2021-11-07 NOTE — BHH Group Notes (Signed)
Goals Group 11/07/21    Group Focus: affirmation, clarity of thought, and goals/reality orientation Treatment Modality:  Psychoeducation Interventions utilized were assignment, group exercise, and support Purpose: To be able to understand and verbalize the reason for their admission to the hospital. To understand that the medication helps with their chemical imbalance but they also need to work on their choices in life. To be challenged to develop [name] list of 30 positives about themselves. Also introduce the concept that "feelings" are not reality.  Participation Level:  Active  Participation Quality:  Appropriate  Affect:  Appropriate  Cognitive:  Appropriate  Insight:  Improving  Engagement in Group:  Engaged  Additional Comments:  Rates her energy at [name] 10/10. States she came to the unit due to HI and SI  Brittany [name], Brittany [name]

## 2021-11-07 NOTE — Progress Notes (Signed)
°  [facility] Adult Case Management Discharge Plan :  Will you be returning to the same living situation after discharge:  Yes,  with husband At discharge, do you have transportation home?: Yes,  husband to pick up Do you have the ability to pay for your medications: Yes,  Medicare  Release of information consent forms completed and emailed to Medical Records, then turned in to Medical Records by CSW.   Patient to Follow up at:  Follow-up Information     [place], Myrene Galas, MD. Go on 11/13/2021.   Specialty: Internal Medicine Why: You have an appointment for primary care services on 11/13/21 at 11:10 am.  This appointment will be held in person. Contact information: [address] [place] [address] [phone]         Arsenio Loader, [facility]. Go on 11/12/2021.   Why: You have a hospital follow-up appointment for therapy and medication management services on 11/12/21 at 8:30 am. This appointment will be held in person. Contact information: [address] [place] [address] [phone]                 Next level of care provider has access to [facility] Link:no  Safety Planning and Suicide Prevention discussed: No.  2 attempts made with husband and brochure sent home   Has patient been referred to the Quitline?: N/A patient is not a smoker  Patient has been referred for addiction treatment: N/A  Lynnell Chad, [name] 11/07/2021, 9:27 AM

## 2021-11-07 NOTE — BHH Suicide Risk Assessment (Signed)
[facility] Discharge Suicide Risk Assessment   Principal Problem: Bipolar I disorder, most recent episode mixed (HCC) Discharge Diagnoses: Principal Problem:   Bipolar I disorder, most recent episode mixed (HCC) Active Problems:   Borderline personality disorder (HCC)   Bipolar affective disorder, mixed, severe, with psychotic behavior (HCC)   Sedative, 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HCC)   PTSD (post-traumatic stress disorder)   Cannabis abuse   Total Time spent with patient: 30 minutes  Musculoskeletal: Strength & Muscle Tone: within normal limits Gait & Station: normal Patient leans: N/A  Psychiatric Specialty Exam  Presentation  General Appearance: Appropriate for Environment  Eye Contact:Fair  Speech:Normal Rate  Speech Volume:Normal  Handedness:Right   Mood and Affect  Mood:Euthymic  Duration of Depression Symptoms: Greater than two weeks  Affect:Appropriate   Thought Process  Thought Processes:Coherent  Descriptions of Associations:Intact  Orientation:Full (Time, Place and Person)  Thought Content:Logical  History of Schizophrenia/Schizoaffective disorder:No  Duration of Psychotic Symptoms:Less than six months  Hallucinations:Hallucinations: None  Ideas of Reference:None  Suicidal Thoughts:Suicidal Thoughts: No  Homicidal Thoughts:Homicidal Thoughts: No   Sensorium  Memory:Immediate Fair; Remote Fair  Judgment:Fair  Insight:Fair   Executive Functions  Concentration:Fair  Attention Span:Fair  Recall:Fair  Fund of Knowledge:Fair  Language:Fair   Psychomotor Activity  Psychomotor Activity:No data recorded  Assets  Assets:Communication Skills; Housing; Physical Health   Sleep  Sleep:Sleep: Fair   Physical Exam: Physical Exam Vitals and nursing note reviewed.  Constitutional:      Appearance: Normal appearance.  HENT:     Head: Normocephalic.  Eyes:     Extraocular Movements: Extraocular movements intact.   Pulmonary:     Effort: Pulmonary effort is normal.  Musculoskeletal:        General: Normal range of motion.     Cervical back: Normal range of motion.  Neurological:     General: No focal deficit present.     Mental Status: She is alert and oriented to person, place, and time.  Psychiatric:        Attention and Perception: Attention and perception normal.        Mood and Affect: Mood and affect normal.        Speech: Speech normal.        Behavior: Behavior normal. Behavior is cooperative.        Thought Content: Thought content normal.        Cognition and Memory: Cognition and memory normal.        Judgment: Judgment normal.   Review of Systems  Constitutional:  Negative for fever.  Respiratory:  Negative for cough.   Cardiovascular:  Negative for chest pain.  Gastrointestinal:  Negative for constipation, diarrhea, nausea and vomiting.  Genitourinary:  Negative for dysuria.  Musculoskeletal:  Positive for myalgias. Negative for back pain.  Skin:  Negative for rash.  Neurological:  Negative for dizziness and headaches.  Psychiatric/Behavioral:  Negative for depression, hallucinations and suicidal ideas.   Blood pressure 115/86, pulse 94, temperature 97.6 F (36.4 C), temperature source Oral, resp. rate 16, height 5\' 4"  (1.626 m), weight 98 kg, SpO2 99 %. Body mass index is 37.08 kg/m.  Mental Status Per Nursing Assessment::   On Admission:  Suicidal ideation indicated by patient, Thoughts of violence towards others, Suicidal ideation indicated by others, Plan to harm others, Intention to act on suicide plan, Belief that plan would result in death  Demographic Factors:  Caucasian and Unemployed  Loss Factors: NA  Historical Factors: Impulsivity  Risk Reduction Factors:  Sense of responsibility to family, Living with another person, especially a relative, Positive social support, and Positive therapeutic relationship  Continued Clinical Symptoms:  Bipolar Disorder:    Mixed State Personality Disorders:   Cluster B Chronic Pain More than one psychiatric diagnosis Previous Psychiatric Diagnoses and Treatments  Cognitive Features That Contribute To Risk:  Polarized thinking    Suicide Risk:  Mild:  Suicidal ideation of limited frequency, intensity, duration, and specificity.  There are no identifiable plans, no associated intent, mild dysphoria and related symptoms, good self-control (both objective and subjective assessment), few other risk factors, and identifiable protective factors, including available and accessible social support.   Follow-up Information     Bardwell, Myrene Galas, MD. Go on 11/13/2021.   Specialty: Internal Medicine Why: You have an appointment for primary care services on 11/13/21 at 11:10 am.  This appointment will be held in person. Contact information: [address] [place] [address] [phone]         Arsenio Loader, [facility]. Go on 11/12/2021.   Why: You have a hospital follow-up appointment for therapy and medication management services on 11/12/21 at 8:30 am. This appointment will be held in person. Contact information: [address] [place] [address] [phone]                 Plan Of Care/Follow-up recommendations:  Activity:  ad lib Diet:  cardiac diet Other:  continue to taper xanax as tolerated. Advised not to resume stimulants.  Advised to have controlled medications managed by psychiatrist, and for coordinated care between providers.  Do not mix controlled substances and alcohol or illicit substances.   Prescriptions for new medications provided for the patient to bridge to follow up appointment. The patient was informed that refills for these prescriptions are generally not provided, and patient is encouraged to attend all follow up appointments to address medication refills and adjustments.   Today's discharge was reviewed with treatment team, and the team is in agreement that the  patient is ready for discharge. The patient is was of the discharge plan for today and has been given opportunity to ask questions. At time of discharge, the patient does not vocalize any acute harm to self or others, is goal directed, able to advocate for self and organizational baseline.   At discharge, the patient is instructed to:  Take all medications as prescribed. Report any adverse effects and or reactions from the medicines to her outpatient provider promptly.  Do not engage in alcohol and/or illegal drug use while on prescription medicines.  In the event of worsening symptoms, patient is instructed to call the crisis hotline, 911 and or go to the nearest ED for appropriate evaluation and treatment of symptoms.  Follow-up with primary care provider for further care of medical issues, concerns and or health care needs. * Substance abuse follow up: it is recommended that you follow up with community support treatment, like AA/NA. It is also recommended that the patient attend 90 meetings in 90 days, otherwise known as "90 in 90" * Pregnancy: Mood stabilizing agents and other medications used in psychiatry may pose risk to pregnancy. Women of childbearing age are advised to use birth control. If you are planning on becoming pregnant, please discuss with both your OBGYN and your psychiatrist prior to stopping medication and prior to pregnancy.   Roselle Locus, MD 11/07/2021, 10:47 AM

## 2021-11-07 NOTE — BHH Suicide Risk Assessment (Signed)
[place] INPATIENT:  Family/Significant Other Suicide Prevention Education  Suicide Prevention Education:  Contact Attempts: Brynley Badgley [phone] (Husband), (name of family member/significant other) has been identified by the patient as the family member/significant other with whom the patient will be residing, and identified as the person(s) who will aid the patient in the event of a mental health crisis.  With written consent from the patient, two attempts were made to provide suicide prevention education, prior to and/or following the patient's discharge.  We were unsuccessful in providing suicide prevention education.  A suicide education pamphlet was given to the patient to share with family/significant other.  Date and time of first attempt:   first attempt 11/05/21 at 2:36pm by weekday staff Date and time of second attempt:  11/07/2021  /  9:25 AM HIPAA-compliant voicemail left saying that brochure will be placed in patient's discharge packet and that patient can be picked up around 12:30pm.  Maretta Los 11/07/2021, 9:24 AM

## 2021-11-07 NOTE — Discharge Instructions (Signed)
Prescriptions for new medications provided for the patient to bridge to follow up appointment. The patient was informed that refills for these prescriptions are generally not provided, and patient is encouraged to attend all follow up appointments to address medication refills and adjustments.   Today's discharge was reviewed with treatment team, and the team is in agreement that the patient is ready for discharge. The patient is was of the discharge plan for today and has been given opportunity to ask questions. At time of discharge, the patient does not vocalize any acute harm to self or others, is goal directed, able to advocate for self and organizational baseline.   At discharge, the patient is instructed to:  Take all medications as prescribed. Report any adverse effects and or reactions from the medicines to her outpatient provider promptly.  Do not engage in alcohol and/or illegal drug use while on prescription medicines.  In the event of worsening symptoms, patient is instructed to call the crisis hotline, 911 and or go to the nearest ED for appropriate evaluation and treatment of symptoms.  Follow-up with primary care provider for further care of medical issues, concerns and or health care needs. * Substance abuse follow up: it is recommended that you follow up with community support treatment, like AA/NA. It is also recommended that the patient attend 90 meetings in 90 days, otherwise known as "90 in 90" * Pregnancy: Mood stabilizing agents and other medications used in psychiatry may pose risk to pregnancy. Women of childbearing age are advised to use birth control. If you are planning on becoming pregnant, please discuss with both your OBGYN and your psychiatrist prior to stopping medication and prior to pregnancy.

## 2022-11-09 ENCOUNTER — Encounter (HOSPITAL_COMMUNITY): Payer: Self-pay | Admitting: Psychiatry

## 2022-11-09 ENCOUNTER — Inpatient Hospital Stay (HOSPITAL_COMMUNITY)
Admission: AD | Admit: 2022-11-09 | Discharge: 2022-11-12 | DRG: 885 | Disposition: A | Discharge status: Home / Self Care | Payer: Medicare Other | Source: Intra-hospital | Attending: Psychiatry | Admitting: Psychiatry

## 2022-11-09 DIAGNOSIS — E785 Hyperlipidemia, unspecified: Secondary | ICD-10-CM | POA: Diagnosis present

## 2022-11-09 DIAGNOSIS — Z1152 Encounter for screening for COVID-19: Secondary | ICD-10-CM | POA: Diagnosis not present

## 2022-11-09 DIAGNOSIS — E661 Drug-induced obesity: Secondary | ICD-10-CM | POA: Diagnosis present

## 2022-11-09 DIAGNOSIS — Z560 Unemployment, unspecified: Secondary | ICD-10-CM

## 2022-11-09 DIAGNOSIS — Z818 Family history of other mental and behavioral disorders: Secondary | ICD-10-CM

## 2022-11-09 DIAGNOSIS — Z79899 Other long term (current) drug therapy: Secondary | ICD-10-CM | POA: Diagnosis not present

## 2022-11-09 DIAGNOSIS — F411 Generalized anxiety disorder: Secondary | ICD-10-CM | POA: Diagnosis present

## 2022-11-09 DIAGNOSIS — K219 Gastro-esophageal reflux disease without esophagitis: Secondary | ICD-10-CM | POA: Diagnosis present

## 2022-11-09 DIAGNOSIS — G479 Sleep disorder, unspecified: Secondary | ICD-10-CM | POA: Diagnosis present

## 2022-11-09 DIAGNOSIS — Z888 Allergy status to other drugs, medicaments and biological substances status: Secondary | ICD-10-CM | POA: Diagnosis not present

## 2022-11-09 DIAGNOSIS — Z813 Family history of other psychoactive substance abuse and dependence: Secondary | ICD-10-CM | POA: Diagnosis not present

## 2022-11-09 DIAGNOSIS — Z62810 Personal history of physical and sexual abuse in childhood: Secondary | ICD-10-CM | POA: Diagnosis not present

## 2022-11-09 DIAGNOSIS — F17290 Nicotine dependence, other tobacco product, uncomplicated: Secondary | ICD-10-CM | POA: Diagnosis present

## 2022-11-09 DIAGNOSIS — Z6841 Body Mass Index (BMI) 40.0 and over, adult: Secondary | ICD-10-CM | POA: Diagnosis not present

## 2022-11-09 DIAGNOSIS — Z9151 Personal history of suicidal behavior: Secondary | ICD-10-CM

## 2022-11-09 DIAGNOSIS — Z807 Family history of other malignant neoplasms of lymphoid, hematopoietic and related tissues: Secondary | ICD-10-CM | POA: Diagnosis not present

## 2022-11-09 DIAGNOSIS — F314 Bipolar disorder, current episode depressed, severe, without psychotic features: Principal | ICD-10-CM | POA: Diagnosis present

## 2022-11-09 DIAGNOSIS — F909 Attention-deficit hyperactivity disorder, unspecified type: Secondary | ICD-10-CM | POA: Diagnosis present

## 2022-11-09 DIAGNOSIS — F410 Panic disorder [episodic paroxysmal anxiety] without agoraphobia: Secondary | ICD-10-CM | POA: Diagnosis present

## 2022-11-09 DIAGNOSIS — M170 Bilateral primary osteoarthritis of knee: Secondary | ICD-10-CM | POA: Diagnosis present

## 2022-11-09 DIAGNOSIS — Z9152 Personal history of nonsuicidal self-harm: Secondary | ICD-10-CM | POA: Diagnosis not present

## 2022-11-09 DIAGNOSIS — Z7984 Long term (current) use of oral hypoglycemic drugs: Secondary | ICD-10-CM

## 2022-11-09 DIAGNOSIS — F4310 Post-traumatic stress disorder, unspecified: Secondary | ICD-10-CM | POA: Diagnosis present

## 2022-11-09 DIAGNOSIS — T43595A Adverse effect of other antipsychotics and neuroleptics, initial encounter: Secondary | ICD-10-CM | POA: Diagnosis present

## 2022-11-09 DIAGNOSIS — F22 Delusional disorders: Secondary | ICD-10-CM | POA: Diagnosis present

## 2022-11-09 DIAGNOSIS — E8881 Metabolic syndrome: Secondary | ICD-10-CM | POA: Diagnosis present

## 2022-11-09 DIAGNOSIS — F5104 Psychophysiologic insomnia: Secondary | ICD-10-CM | POA: Insufficient documentation

## 2022-11-09 DIAGNOSIS — F603 Borderline personality disorder: Secondary | ICD-10-CM | POA: Diagnosis present

## 2022-11-09 DIAGNOSIS — Z9851 Tubal ligation status: Secondary | ICD-10-CM

## 2022-11-09 DIAGNOSIS — G43909 Migraine, unspecified, not intractable, without status migrainosus: Secondary | ICD-10-CM | POA: Diagnosis present

## 2022-11-09 MED ORDER — KETOROLAC TROMETHAMINE 30 MG/ML IJ SOLN: 30.0000 mg | Freq: Once | INTRAMUSCULAR | Status: DC

## 2022-11-09 MED ORDER — MIDAZOLAM HCL 2 MG/2ML IJ SOLN: 2.0000 mg | Freq: Once | INTRAMUSCULAR | Status: DC

## 2022-11-09 MED ORDER — ONDANSETRON HCL 4 MG/2ML IJ SOLN: 4.0000 mg | Freq: Once | INTRAMUSCULAR | Status: DC

## 2022-11-09 MED ORDER — ARIPIPRAZOLE 10 MG PO TABS
10.0000 mg | ORAL_TABLET | Freq: Every day | ORAL | Status: DC
  Administered 2022-11-09 – 2022-11-11 (×3): 10 mg via ORAL
  Filled 2022-11-09 (×6): qty 1

## 2022-11-09 MED ORDER — LAMOTRIGINE 100 MG PO TABS
100.0000 mg | ORAL_TABLET | Freq: Every day | ORAL | Status: DC
  Administered 2022-11-09 – 2022-11-11 (×3): 100 mg via ORAL
  Filled 2022-11-09 (×4): qty 1

## 2022-11-09 MED ORDER — TRAZODONE HCL 50 MG PO TABS: 50.0000 mg | ORAL_TABLET | Freq: Every evening | ORAL | Status: DC | PRN

## 2022-11-09 MED ORDER — PRAZOSIN HCL 2 MG PO CAPS
2.0000 mg | ORAL_CAPSULE | Freq: Every day | ORAL | Status: DC
  Administered 2022-11-09 – 2022-11-11 (×3): 2 mg via ORAL
  Filled 2022-11-09 (×4): qty 1

## 2022-11-09 MED ORDER — TRAZODONE HCL 150 MG PO TABS
300.0000 mg | ORAL_TABLET | Freq: Every day | ORAL | Status: DC
  Administered 2022-11-09 – 2022-11-11 (×3): 300 mg via ORAL
  Filled 2022-11-09 (×4): qty 2

## 2022-11-09 MED ORDER — ZIPRASIDONE MESYLATE 20 MG IM SOLR: 20.0000 mg | INTRAMUSCULAR | Status: DC | PRN

## 2022-11-09 MED ORDER — NICOTINE 14 MG/24HR TD PT24
14.0000 mg | MEDICATED_PATCH | Freq: Every day | TRANSDERMAL | Status: DC
  Administered 2022-11-09 – 2022-11-10 (×2): 14 mg via TRANSDERMAL
  Filled 2022-11-09 (×5): qty 1

## 2022-11-09 MED ORDER — HYDROXYZINE HCL 25 MG PO TABS
25.0000 mg | ORAL_TABLET | Freq: Three times a day (TID) | ORAL | Status: DC | PRN
  Administered 2022-11-10 – 2022-11-11 (×2): 25 mg via ORAL
  Filled 2022-11-09 (×4): qty 1

## 2022-11-09 MED ORDER — TRAZODONE HCL 150 MG PO TABS
600.0000 mg | ORAL_TABLET | Freq: Every day | ORAL | Status: DC
  Filled 2022-11-09: qty 4

## 2022-11-09 MED ORDER — ALUM & MAG HYDROXIDE-SIMETH 200-200-20 MG/5ML PO SUSP: 30.0000 mL | ORAL | Status: DC | PRN

## 2022-11-09 MED ORDER — HALOPERIDOL LACTATE 5 MG/ML IJ SOLN: 5.0000 mg | Freq: Once | INTRAMUSCULAR | Status: DC

## 2022-11-09 MED ORDER — LORAZEPAM 1 MG PO TABS: 1.0000 mg | ORAL_TABLET | ORAL | Status: DC | PRN

## 2022-11-09 MED ORDER — MAGNESIUM HYDROXIDE 400 MG/5ML PO SUSP: 30.0000 mL | Freq: Every day | ORAL | Status: DC | PRN

## 2022-11-09 MED ORDER — ACETAMINOPHEN 325 MG PO TABS
650.0000 mg | ORAL_TABLET | Freq: Four times a day (QID) | ORAL | Status: DC | PRN
  Administered 2022-11-10 – 2022-11-11 (×3): 650 mg via ORAL
  Filled 2022-11-09 (×3): qty 2

## 2022-11-09 MED ORDER — SODIUM CHLORIDE 0.9 % IV SOLN: 500.0000 mL | Freq: Once | INTRAVENOUS | Status: DC

## 2022-11-09 MED ORDER — METFORMIN HCL ER 500 MG PO TB24
500.0000 mg | ORAL_TABLET | Freq: Every day | ORAL | Status: DC
  Administered 2022-11-10 – 2022-11-12 (×3): 500 mg via ORAL
  Filled 2022-11-09 (×5): qty 1

## 2022-11-09 MED ORDER — ALPRAZOLAM 0.5 MG PO TABS
2.0000 mg | ORAL_TABLET | Freq: Three times a day (TID) | ORAL | Status: DC
  Administered 2022-11-09 – 2022-11-12 (×9): 2 mg via ORAL
  Filled 2022-11-09 (×9): qty 4

## 2022-11-09 MED ORDER — OLANZAPINE 5 MG PO TABS
5.0000 mg | ORAL_TABLET | Freq: Every day | ORAL | Status: DC
  Administered 2022-11-09: 5 mg via ORAL
  Filled 2022-11-09 (×2): qty 1

## 2022-11-09 MED ORDER — VILAZODONE HCL 40 MG PO TABS
40.0000 mg | ORAL_TABLET | Freq: Every day | ORAL | Status: DC
  Administered 2022-11-09 – 2022-11-11 (×3): 40 mg via ORAL
  Filled 2022-11-09 (×4): qty 1

## 2022-11-09 MED ORDER — OLANZAPINE 5 MG PO TBDP
5.0000 mg | ORAL_TABLET | Freq: Three times a day (TID) | ORAL | Status: DC | PRN
  Filled 2022-11-09: qty 1

## 2022-11-09 NOTE — Plan of Care (Signed)
  Problem: Activity: Goal: Interest or engagement in leisure activities will improve 11/09/2022 1845 by Dorris Carnes, RN Outcome: Progressing 11/09/2022 1507 by Dorris Carnes, RN Outcome: Progressing Goal: Imbalance in normal sleep/wake cycle will improve 11/09/2022 1845 by Dorris Carnes, RN Outcome: Progressing 11/09/2022 1507 by Dorris Carnes, RN Outcome: Progressing   Problem: Coping: Goal: Coping ability will improve 11/09/2022 1845 by Dorris Carnes, RN Outcome: Progressing 11/09/2022 1507 by Dorris Carnes, RN Outcome: Progressing Goal: Will verbalize feelings 11/09/2022 1845 by Dorris Carnes, RN Outcome: Progressing 11/09/2022 1507 by Dorris Carnes, RN Outcome: Progressing
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
## 2022-11-09 NOTE — Plan of Care (Signed)
  Problem: Coping: Goal: Coping ability will improve Outcome: Progressing Goal: Will verbalize feelings Outcome: Progressing   Problem: Activity: Goal: Interest or engagement in leisure activities will improve Outcome: Progressing Goal: Imbalance in normal sleep/wake cycle will improve Outcome: Progressing

## 2022-11-09 NOTE — Progress Notes (Signed)
Brittany [name] was on the phone, she slam down the phone. Brittany [name] came over to the nursing station sat in one of the chairs with wheels at the nursing station. Writer asked Brittany [name] not to sit in the chair. Brittany [name] stands at the nursing station desk. Writer asked Brittany [name] not to stand at the nursing station desk due to HIPP there is a sign on the desk. "Please help to practice proper social distancing and protect the personal health information of others . Please do not lean on the Nurse's Brittany [name] began to scream and curse calling the writer a "bitch" goes to her room and continue to yell scream and curse. RN notify

## 2022-11-09 NOTE — Group Note (Signed)
[name] Group Therapy Note   Group Date: 11/09/2022 Start Time: 1100 End Time: 1200  Type of Therapy and Topic:  Group Therapy:  Strengths Exploration   Participation Level: Did Not Attend  Description of Group: This group allows individuals to explore their strengths, learn to use strengths in new ways to improve well-being. Strengths-based interventions involve identifying strengths, understanding how they are used, and learning new ways to apply them. Individuals will identify their strengths, and then explore their roles in different areas of life (relationships, professional life, and personal fulfillment). Individuals will think about ways in which they currently use their strengths, along with new ways they could begin using them.    Therapeutic Goals Patient will verbalize two of their strengths. Patient will identify how their strengths are currently used. Patient will identify two new ways to apply their strengths. Patients will create a plan to apply their strengths in their daily lives.     Summary of Patient Progress:  Did not attend      Therapeutic Modalities Cognitive Behavioral Therapy Motivational Interviewing  Kamil Hanigan M Deazia Lampi, LCSWA 11/09/2022  1:07 PM    
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
## 2022-11-09 NOTE — [facility] Group Notes (Signed)
PT did not attend goals group today 
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
## 2022-11-09 NOTE — Progress Notes (Signed)
   11/09/22 0800  Psych Admission Type (Psych Patients Only)  Admission Status Involuntary  Psychosocial Assessment  Patient Complaints Agitation;Anxiety  Eye Contact Fair  Facial Expression Anxious  Affect Anxious  Speech Slow  Interaction Manipulative  Motor Activity Slow  Appearance/Hygiene Unremarkable  Behavior Characteristics Cooperative;Anxious  Mood Anxious;Pleasant  Thought Process  Coherency WDL  Content WDL  Delusions WDL  Perception WDL  Hallucination None reported or observed  Judgment Poor  Confusion WDL  Danger to Self  Current suicidal ideation? Denies  Danger to Others  Danger to Others None reported or observed
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
## 2022-11-09 NOTE — Plan of Care (Signed)
Collateral call  Spoke with patient's husband Tayte Childers [phone]) and discussed goals of hospital stay and reasons for admission. He is agreeable to the plan for medicaiton adjustment and monitoring pt for stabilization. Encouraged him to visit the patient in the hospital and he agrees.  Coralyn Pear, MD PGY-1 Psychiatry

## 2022-11-09 NOTE — Progress Notes (Signed)
Patient rated her day as a 6 out of 10 but did not divulge any details.

## 2022-11-10 ENCOUNTER — Encounter (HOSPITAL_COMMUNITY): Payer: Self-pay

## 2022-11-10 DIAGNOSIS — F314 Bipolar disorder, current episode depressed, severe, without psychotic features: Secondary | ICD-10-CM | POA: Diagnosis not present

## 2022-11-10 LAB — COMPREHENSIVE METABOLIC PANEL
ALT: 41 U/L (ref 0–44)
AST: 29 U/L (ref 15–41)
Albumin: 3.6 g/dL (ref 3.5–5.0)
Alkaline Phosphatase: 49 U/L (ref 38–126)
Anion gap: 9 (ref 5–15)
BUN: 13 mg/dL (ref 6–20)
CO2: 21 mmol/L — ABNORMAL LOW (ref 22–32)
Calcium: 8.8 mg/dL — ABNORMAL LOW (ref 8.9–10.3)
Chloride: 106 mmol/L (ref 98–111)
Creatinine, Ser: 0.85 mg/dL (ref 0.44–1.00)
GFR, Estimated: >60 mL/min (ref >60)
Glucose, Bld: 116 mg/dL — ABNORMAL HIGH (ref 70–99)
Potassium: 4.2 mmol/L (ref 3.5–5.1)
Sodium: 136 mmol/L (ref 135–145)
Total Bilirubin: 0.3 mg/dL (ref 0.3–1.2)
Total Protein: 6.8 g/dL (ref 6.5–8.1)

## 2022-11-10 LAB — CBC WITH DIFFERENTIAL/PLATELET
Abs Immature Granulocytes: 0.04 10*3/uL (ref 0.00–0.07)
Basophils Absolute: 0 10*3/uL (ref 0.0–0.1)
Basophils Relative: 0 %
Eosinophils Absolute: 0.1 10*3/uL (ref 0.0–0.5)
Eosinophils Relative: 1 %
HCT: 38.5 % (ref 36.0–46.0)
Hemoglobin: 12.4 g/dL (ref 12.0–15.0)
Immature Granulocytes: 0 %
Lymphocytes Relative: 13 %
Lymphs Abs: 1.2 10*3/uL (ref 0.7–4.0)
MCH: 31 pg (ref 26.0–34.0)
MCHC: 32.2 g/dL (ref 30.0–36.0)
MCV: 96.3 fL (ref 80.0–100.0)
Monocytes Absolute: 0.5 10*3/uL (ref 0.1–1.0)
Monocytes Relative: 5 %
Neutro Abs: 7.7 10*3/uL (ref 1.7–7.7)
Neutrophils Relative %: 81 %
Platelets: 191 10*3/uL (ref 150–400)
RBC: 4 MIL/uL (ref 3.87–5.11)
RDW: 13.2 % (ref 11.5–15.5)
WBC: 9.5 10*3/uL (ref 4.0–10.5)
nRBC: 0 % (ref 0.0–0.2)

## 2022-11-10 LAB — RESPIRATORY PANEL BY PCR

## 2022-11-10 LAB — LIPID PANEL
Cholesterol: 231 mg/dL — ABNORMAL HIGH (ref 0–200)
HDL: 46 mg/dL (ref >40)
LDL Cholesterol: 145 mg/dL — ABNORMAL HIGH (ref 0–99)
Total CHOL/HDL Ratio: 5 RATIO
Triglycerides: 201 mg/dL — ABNORMAL HIGH (ref <150)
VLDL: 40 mg/dL (ref 0–40)

## 2022-11-10 LAB — HEMOGLOBIN A1C
Hgb A1c MFr Bld: 5 % (ref 4.8–5.6)
Mean Plasma Glucose: 96.8 mg/dL

## 2022-11-10 LAB — RESP PANEL BY RT-PCR (RSV, FLU A&B, COVID)  RVPGX2
Influenza A by PCR: NEGATIVE
Influenza B by PCR: NEGATIVE
Resp Syncytial Virus by PCR: NEGATIVE
SARS Coronavirus 2 by RT PCR: NEGATIVE

## 2022-11-10 LAB — TSH: TSH: 2.702 u[IU]/mL (ref 0.350–4.500)

## 2022-11-10 MED ORDER — BACITRACIN-NEOMYCIN-POLYMYXIN OINTMENT TUBE
TOPICAL_OINTMENT | CUTANEOUS | Status: DC | PRN
  Filled 2022-11-10: qty 14.17

## 2022-11-10 NOTE — [facility] Group Notes (Signed)
Adult Psychoeducational Group Note  Date:  11/10/2022 Time:  7:51 PM  Group Topic/Focus:  BING: Helps to improve memory and other cognitive skills, as well as strengthen focus and concentration.   Participation Level:  Did Not Attend  Richardean Chimera 11/10/2022, 7:51 PM

## 2022-11-10 NOTE — Progress Notes (Signed)
Siena said she need her medication she has been laying there patiently waiting on her nurse to give her her medication. Writer asked nurse medication for [employer]. Nurse explain he already gave her medication. Writer explain to Stickney she already had her medication per Therapist, sports.

## 2022-11-10 NOTE — Progress Notes (Signed)
Adult Psychoeducational Group Note  Date:  11/10/2022 Time:  10:38 PM  Group Topic/Focus:  Wrap-Up Group:   The focus of this group is to help patients review their daily goal of treatment and discuss progress on daily workbooks.  Participation Level:  Did Not Attend  Participation Quality:   n/a  Affect:   n/a  Cognitive:   n/a  Insight: None  Engagement in Group:   n/a  Modes of Intervention:   n/a  Additional Comments:   Did not attend.  Wetzel Bjornstad Agape Hardiman 11/10/2022, 10:38 PM

## 2022-11-10 NOTE — Progress Notes (Signed)
   11/10/22 0000  Psych Admission Type (Psych Patients Only)  Admission Status Involuntary  Psychosocial Assessment  Patient Complaints Anxiety;Irritability  Eye Contact Fair  Facial Expression Anxious  Affect Irritable  Speech Slow  Interaction Manipulative  Motor Activity Slow  Appearance/Hygiene Unremarkable  Behavior Characteristics Agitated;Irritable  Mood Anxious;Depressed  Thought Process  Coherency WDL  Content WDL  Delusions None reported or observed  Perception WDL  Hallucination None reported or observed  Judgment Poor  Confusion WDL  Danger to Self  Current suicidal ideation? Denies  Danger to Others  Danger to Others None reported or observed

## 2022-11-10 NOTE — [facility] Group Notes (Signed)
Adult Psychoeducational Group Note  Date:  11/10/2022 Time:  3:36 PM  Group Topic/Focus:  Goals Group:   The focus of this group is to help patients establish daily goals to achieve during treatment and discuss how the patient can incorporate goal setting into their daily lives to aide in recovery.  Participation Level:  Did Not Attend  Richardean Chimera 11/10/2022, 3:36 PM
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
## 2022-11-10 NOTE — BH IP Treatment Plan (Signed)
Interdisciplinary Treatment and Diagnostic Plan Update  11/10/2022 Time of Session: 9:40am  Brittany [name] MRN: [id number]  Principal Diagnosis: Severe bipolar I disorder, most recent episode depressed ([place])  Secondary Diagnoses: Principal Problem:   Severe bipolar I disorder, most recent episode depressed ([place]) Active Problems:   Borderline personality disorder ([place])   Panic disorder   GAD (generalized anxiety disorder)   Chronic insomnia   Current Medications:  Current Facility-Administered Medications  Medication Dose Route Frequency Provider Last Rate Last Admin   acetaminophen (TYLENOL) tablet 650 mg  650 mg Oral Q6H PRN Onuoha, Chinwendu V, NP   650 mg at 11/10/22 1142   ALPRAZolam (XANAX) tablet 2 mg  2 mg Oral Q8H Coralyn Pear B, MD   2 mg at 11/10/22 1323   alum & mag hydroxide-simeth (MAALOX/MYLANTA) 200-200-20 MG/5ML suspension 30 mL  30 mL Oral Q4H PRN Onuoha, Chinwendu V, NP       ARIPiprazole (ABILIFY) tablet 10 mg  10 mg Oral Daily Coralyn Pear B, MD   10 mg at 11/10/22 0810   hydrOXYzine (ATARAX) tablet 25 mg  25 mg Oral TID PRN Onuoha, Chinwendu V, NP       lamoTRIgine (LAMICTAL) tablet 100 mg  100 mg Oral QHS Coralyn Pear B, MD   100 mg at 11/09/22 2113   OLANZapine zydis (ZYPREXA) disintegrating tablet 5 mg  5 mg Oral Q8H PRN Massengill, Ovid Curd, MD       And   LORazepam (ATIVAN) tablet 1 mg  1 mg Oral PRN Massengill, Ovid Curd, MD       And   ziprasidone (GEODON) injection 20 mg  20 mg Intramuscular PRN Massengill, Ovid Curd, MD       magnesium hydroxide (MILK OF MAGNESIA) suspension 30 mL  30 mL Oral Daily PRN Onuoha, Chinwendu V, NP       metFORMIN (GLUCOPHAGE-XR) 24 hr tablet 500 mg  500 mg Oral Q breakfast Coralyn Pear B, MD   500 mg at 11/10/22 9485   neomycin-bacitracin-polymyxin (NEOSPORIN) ointment   Topical PRN Massengill, Ovid Curd, MD       nicotine (NICODERM CQ - dosed in mg/24 hours) patch 14 mg  14 mg Transdermal Daily Coralyn Pear B, MD   14 mg  at 11/10/22 0813   OLANZapine (ZYPREXA) tablet 5 mg  5 mg Oral QHS Coralyn Pear B, MD   5 mg at 11/09/22 2113   prazosin (MINIPRESS) capsule 2 mg  2 mg Oral QHS Coralyn Pear B, MD   2 mg at 11/09/22 2113   traZODone (DESYREL) tablet 300 mg  300 mg Oral QHS Coralyn Pear B, MD   300 mg at 11/09/22 2113   Vilazodone HCl (VIIBRYD) TABS 40 mg  40 mg Oral Q supper Coralyn Pear B, MD   40 mg at 11/09/22 1710   PTA Medications: Medications Prior to Admission  Medication Sig Dispense Refill Last Dose   alprazolam (XANAX) 2 MG tablet Take 2 mg by mouth 3 (three) times daily.   11/08/2022   lamoTRIgine (LAMICTAL) 100 MG tablet Take 100 mg by mouth at bedtime.   11/04/2022   OLANZapine (ZYPREXA) 10 MG tablet Take 10 mg by mouth at bedtime.   11/04/2022   prazosin (MINIPRESS) 2 MG capsule Take 2 mg by mouth at bedtime.   11/04/2022   traZODone (DESYREL) 150 MG tablet Take 600 mg by mouth at bedtime.   11/08/2022   Vilazodone HCl (VIIBRYD) 40 MG TABS Take 40 mg by mouth at bedtime.  11/08/2022   VYVANSE 70 MG capsule Take 70 mg by mouth daily.   11/04/2022   ZETIA 10 MG tablet Take 10 mg by mouth at bedtime.   11/04/2022    Patient Stressors:    Patient Strengths:    Treatment Modalities: Medication Management, Group therapy, Case management,  1 to 1 session with clinician, Psychoeducation, Recreational therapy.   Physician Treatment Plan for Primary Diagnosis: Severe bipolar I disorder, most recent episode depressed ([place]) Long Term Goal(s): Improvement in symptoms so as ready for discharge   Short Term Goals: Ability to identify changes in lifestyle to reduce recurrence of condition will improve Ability to verbalize feelings will improve Ability to disclose and discuss suicidal ideas Ability to demonstrate self-control will improve Ability to identify and develop effective coping behaviors will improve Ability to identify triggers associated with substance abuse/mental health issues will  improve  Medication Management: Evaluate patient's response, side effects, and tolerance of medication regimen.  Therapeutic Interventions: 1 to 1 sessions, Unit Group sessions and Medication administration.  Evaluation of Outcomes: Not Met  Physician Treatment Plan for Secondary Diagnosis: Principal Problem:   Severe bipolar I disorder, most recent episode depressed ([place]) Active Problems:   Borderline personality disorder ([facility])   Panic disorder   GAD (generalized anxiety disorder)   Chronic insomnia  Long Term Goal(s): Improvement in symptoms so as ready for discharge   Short Term Goals: Ability to identify changes in lifestyle to reduce recurrence of condition will improve Ability to verbalize feelings will improve Ability to disclose and discuss suicidal ideas Ability to demonstrate self-control will improve Ability to identify and develop effective coping behaviors will improve Ability to identify triggers associated with substance abuse/mental health issues will improve     Medication Management: Evaluate patient's response, side effects, and tolerance of medication regimen.  Therapeutic Interventions: 1 to 1 sessions, Unit Group sessions and Medication administration.  Evaluation of Outcomes: Not Met   RN Treatment Plan for Primary Diagnosis: Severe bipolar I disorder, most recent episode depressed ([place]) Long Term Goal(s): Knowledge of disease and therapeutic regimen to maintain health will improve  Short Term Goals: Ability to remain free from injury will improve, Ability to participate in decision making will improve, Ability to verbalize feelings will improve, Ability to disclose and discuss suicidal ideas, and Ability to identify and develop effective coping behaviors will improve  Medication Management: RN will administer medications as ordered by provider, will assess and evaluate patient's response and provide education to patient for prescribed medication. RN will  report any adverse and/or side effects to prescribing provider.  Therapeutic Interventions: 1 on 1 counseling sessions, Psychoeducation, Medication administration, Evaluate responses to treatment, Monitor vital signs and CBGs as ordered, Perform/monitor CIWA, COWS, AIMS and Fall Risk screenings as ordered, Perform wound care treatments as ordered.  Evaluation of Outcomes: Not Met   [name] Treatment Plan for Primary Diagnosis: Severe bipolar I disorder, most recent episode depressed ([place]) Long Term Goal(s): Safe transition to appropriate next level of care at discharge, Engage patient in therapeutic group addressing interpersonal concerns.  Short Term Goals: Engage patient in aftercare planning with referrals and resources, Increase social support, Increase emotional regulation, Facilitate acceptance of mental health diagnosis and concerns, Identify triggers associated with mental health/substance abuse issues, and Increase skills for wellness and recovery  Therapeutic Interventions: Assess for all discharge needs, 1 to 1 time with Social worker, Explore available resources and support systems, Assess for adequacy in community support network, Educate family and significant other(s)  on suicide prevention, Complete Psychosocial Assessment, Interpersonal group therapy.  Evaluation of Outcomes: Not Met   Progress in Treatment: Attending groups: No. Participating in groups: No. Taking medication as prescribed: Yes. Toleration medication: Yes. Family/Significant other contact made: Yes, individual(s) contacted:  Husband  Patient understands diagnosis: Yes. Discussing patient identified problems/goals with staff: Yes. Medical problems stabilized or resolved: Yes. Denies suicidal/homicidal ideation: Yes. Issues/concerns per patient self-inventory: No.   New problem(s) identified: No, Describe:  None   New Short Term/Long Term Goal(s): medication stabilization, elimination of SI thoughts,  development of comprehensive mental wellness plan.   Patient Goals: "To go home"   Discharge Plan or Barriers: Patient recently admitted. CSW will continue to follow and assess for appropriate referrals and possible discharge planning.   Reason for Continuation of Hospitalization: Anxiety Depression Medication stabilization Suicidal ideation  Estimated Length of Stay: 3 to 7 days   Last [place] Suicide Severity Risk Score: [place] Admission (Current) from 11/09/2022 in [facility] Admission (Discharged) from 11/03/2021 in [facility] [facility] ED from 11/01/2021 in [facility] Department at [place] No Risk High Risk Error: Q7 should not be populated when Q6 is No       Last PHQ 2/9 Scores:    04/25/2019    1:45 PM  Depression screen PHQ 2/9  Decreased Interest 0  Down, Depressed, Hopeless 0  PHQ - 2 Score 0    Scribe for Treatment Team: Darleen Crocker, Latanya Presser 11/10/2022 1:50 PM

## 2022-11-10 NOTE — Progress Notes (Signed)
Pt continues on q15 checks. Pt denies SI/HI/AVH on assessment. Pt reports sleeping and eating well to RN this morning. Pt requested COVID/flu swab due to reporting body aches and a headache. COVID/flu swab completed and both resulted negative. Pt offered tylenol for pain and received at 1142. Pt also reported to nursing that pt clothing was missing. Pt fixated on this throughout shift. Clothing found by staff and returned to pt. Pt noted to exhibit mood lability throughout shift, splitting on nursing staff when requests not immediately met. Redirection and reassurance provided by staff. Pt is compliant with medications. No aggressive or self injurious behaviors noted this shift.

## 2022-11-11 DIAGNOSIS — F314 Bipolar disorder, current episode depressed, severe, without psychotic features: Secondary | ICD-10-CM | POA: Diagnosis not present

## 2022-11-11 MED ORDER — ARIPIPRAZOLE 15 MG PO TABS
15.0000 mg | ORAL_TABLET | Freq: Every day | ORAL | Status: DC
  Filled 2022-11-11: qty 1

## 2022-11-11 MED ORDER — ARIPIPRAZOLE 5 MG PO TABS
5.0000 mg | ORAL_TABLET | Freq: Every day | ORAL | Status: AC
  Administered 2022-11-11: 5 mg via ORAL
  Filled 2022-11-11 (×2): qty 1

## 2022-11-11 MED ORDER — NICOTINE POLACRILEX 2 MG MT GUM
2.0000 mg | CHEWING_GUM | OROMUCOSAL | Status: DC | PRN
  Administered 2022-11-11 – 2022-11-12 (×2): 2 mg via ORAL

## 2022-11-11 MED ORDER — ARIPIPRAZOLE 10 MG PO TABS
20.0000 mg | ORAL_TABLET | Freq: Every day | ORAL | Status: DC
  Administered 2022-11-12: 20 mg via ORAL
  Filled 2022-11-11 (×2): qty 2
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
## 2022-11-11 NOTE — Progress Notes (Signed)
   11/11/22 2200  Psych Admission Type (Psych Patients Only)  Admission Status Involuntary  Psychosocial Assessment  Patient Complaints Anxiety;Hyperactivity  Eye Contact Fair  Facial Expression Animated;Anxious  Affect Anxious;Euphoric  Speech Unremarkable  Interaction Manipulative;Intrusive  Motor Activity Other (Comment) (WNL)  Appearance/Hygiene Unremarkable  Behavior Characteristics Anxious;Hyperactive  Mood Anxious;Euphoric  Thought Process  Coherency WDL  Content WDL  Delusions None reported or observed  Perception WDL  Hallucination None reported or observed  Judgment Poor  Confusion WDL  Danger to Self  Current suicidal ideation? Denies  Danger to Others  Danger to Others None reported or observed

## 2022-11-11 NOTE — Group Note (Signed)
Date:  11/11/2022 Time:  10:14 AM  Group Topic/Focus:  Orientation:   The focus of this group is to educate the patient on the purpose and policies of crisis stabilization and provide a format to answer questions about their admission.  The group details unit policies and expectations of patients while admitted.    Participation Level:  Active  Participation Quality:  Appropriate  Affect:  Appropriate  Cognitive:  Appropriate  Insight: Appropriate  Engagement in Group:  Engaged  Modes of Intervention:  Discussion  Additional Comments:     [name] 11/11/2022, 10:14 AM

## 2022-11-11 NOTE — Progress Notes (Signed)

## 2022-11-11 NOTE — [facility] Group Notes (Signed)
Adult Psychoeducational Group Note  Date:  11/11/2022 Time:  8:57 PM  Group Topic/Focus:  Wrap-Up Group:   The focus of this group is to help patients review their daily goal of treatment and discuss progress on daily workbooks.  Participation Level:  Active  Participation Quality:  Attentive  Affect:  Appropriate  Cognitive:  Appropriate  Insight: Appropriate  Engagement in Group:  Engaged  Modes of Intervention:  Discussion  Additional Comments:  Patient attended and participated in the [place] group.  Annie Sable 11/11/2022, 8:57 PM

## 2022-11-11 NOTE — Progress Notes (Signed)
   11/11/22 1046  Psych Admission Type (Psych Patients Only)  Admission Status Involuntary  Psychosocial Assessment  Patient Complaints Anxiety  Eye Contact Fair  Facial Expression Anxious  Affect Anxious  Speech Unremarkable  Interaction Manipulative  Motor Activity Slow  Appearance/Hygiene Unremarkable  Behavior Characteristics Irritable  Mood Anxious  Thought Process  Coherency WDL  Content WDL  Delusions None reported or observed  Perception WDL  Hallucination None reported or observed  Judgment Poor  Confusion WDL  Danger to Self  Current suicidal ideation? Denies  Danger to Others  Danger to Others None reported or observed

## 2022-11-12 ENCOUNTER — Other Ambulatory Visit: Payer: Self-pay | Admitting: Psychiatry

## 2022-11-12 DIAGNOSIS — F314 Bipolar disorder, current episode depressed, severe, without psychotic features: Secondary | ICD-10-CM | POA: Diagnosis not present

## 2022-11-12 DIAGNOSIS — F3162 Bipolar disorder, current episode mixed, moderate: Secondary | ICD-10-CM

## 2022-11-12 MED ORDER — ARIPIPRAZOLE 20 MG PO TABS: 20.0000 mg | ORAL_TABLET | Freq: Every day | ORAL | 0 refills | Status: DC

## 2022-11-12 MED ORDER — NICOTINE POLACRILEX 2 MG MT GUM: 2.0000 mg | CHEWING_GUM | OROMUCOSAL | 0 refills | Status: AC | PRN

## 2022-11-12 MED ORDER — VILAZODONE HCL 40 MG PO TABS: 40.0000 mg | ORAL_TABLET | Freq: Every day | ORAL | 0 refills | Status: AC

## 2022-11-12 MED ORDER — LAMOTRIGINE 100 MG PO TABS: 100.0000 mg | ORAL_TABLET | Freq: Every day | ORAL | 0 refills | Status: AC

## 2022-11-12 MED ORDER — ARIPIPRAZOLE 20 MG PO TABS: 20.0000 mg | ORAL_TABLET | Freq: Every day | ORAL | 0 refills | Status: AC

## 2022-11-12 MED ORDER — ALPRAZOLAM 2 MG PO TABS: 2.0000 mg | ORAL_TABLET | Freq: Three times a day (TID) | ORAL | 0 refills | Status: AC

## 2022-11-12 MED ORDER — METFORMIN HCL ER 500 MG PO TB24: 500.0000 mg | ORAL_TABLET | Freq: Every day | ORAL | 0 refills | Status: DC

## 2022-11-12 MED ORDER — NICOTINE 14 MG/24HR TD PT24: 14.0000 mg | MEDICATED_PATCH | Freq: Every day | TRANSDERMAL | 0 refills | Status: AC

## 2022-11-12 MED ORDER — TRAZODONE HCL 300 MG PO TABS: 300.0000 mg | ORAL_TABLET | Freq: Every day | ORAL | 0 refills | Status: AC

## 2022-11-12 MED ORDER — PRAZOSIN HCL 2 MG PO CAPS: 2.0000 mg | ORAL_CAPSULE | Freq: Every day | ORAL | 0 refills | Status: AC

## 2022-11-12 MED ORDER — METFORMIN HCL ER 500 MG PO TB24: 500.0000 mg | ORAL_TABLET | Freq: Every day | ORAL | 0 refills | Status: AC

## 2022-11-12 NOTE — Discharge Instructions (Signed)
-  Follow-up with your outpatient psychiatric provider -instructions on appointment date, time, and address (location) are provided to you in discharge paperwork.  -Take your psychiatric medications as prescribed at discharge - instructions are provided to you in the discharge paperwork  -Follow-up with outpatient primary care doctor and other specialists -for management of preventative medicine and any chronic medical disease.  -Recommend abstinence from alcohol, tobacco, and other illicit drug use at discharge.   -If your psychiatric symptoms recur, worsen, or if you have side effects to your psychiatric medications, call your outpatient psychiatric provider, 911, [phone] or go to the nearest emergency department.  -If suicidal thoughts occur, call your outpatient psychiatric provider, 911, 988 or go to the nearest emergency department.  Naloxone (Narcan) can help reverse an overdose when given to the victim quickly.  [place] offers free naloxone kits and instructions/training on its use.  Add naloxone to your first aid kit and you can help save a life.   Pick up your free kit at the following locations:   [place]:  [facility], [address] [place] [address] ([phone]) [facility] [address] [place] [id number] ([phone]) [facility] [address] [place] [address]  [place]: [facility] [address] [place] [address] ([phone]) [facility] [address] [place] [address] ([phone])  
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
## 2022-11-12 NOTE — Progress Notes (Signed)
   11/12/22 0545  15 Minute Checks  Location Bedroom  Visual Appearance Calm  Behavior Composed  Sleep (Behavioral Health Patients Only)  Calculate sleep? (Click Yes once per 24 hr at 0600 safety check) Yes  Documented sleep last 24 hours 6.75
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
## 2022-11-12 NOTE — Progress Notes (Signed)
   11/12/22 0839  Psych Admission Type (Psych Patients Only)  Admission Status Involuntary  Psychosocial Assessment  Patient Complaints Anxiety  Eye Contact Fair  Facial Expression Anxious  Affect Anxious  Speech Unremarkable  Interaction Assertive  Motor Activity Other (Comment) (WNL)  Appearance/Hygiene Unremarkable  Behavior Characteristics Anxious  Mood Anxious;Pleasant  Thought Process  Coherency WDL  Content WDL  Delusions None reported or observed  Perception WDL  Hallucination None reported or observed  Judgment Poor  Confusion WDL  Danger to Self  Current suicidal ideation? Denies  Danger to Others  Danger to Others None reported or observed

## 2022-11-12 NOTE — Progress Notes (Signed)
Patient discharged. Reviewed discharge instructions. Patient verbalized understanding. Patient received all personal belongings. Patient left unit at 1026.

## 2023-05-05 NOTE — Telephone Encounter (Signed)
Opened in error
